# Patient Record
Sex: Female | Born: 1955 | Race: White | Hispanic: No | Marital: Single | Smoking: Current every day smoker
Health system: Southern US, Community
[De-identification: ages and names within clinical notes are randomized; demographics above are authoritative.]

## PROBLEM LIST (undated history)

## (undated) DIAGNOSIS — F4310 Post-traumatic stress disorder, unspecified: Secondary | ICD-10-CM

## (undated) DIAGNOSIS — J449 Chronic obstructive pulmonary disease, unspecified: Secondary | ICD-10-CM

## (undated) DIAGNOSIS — F32A Depression, unspecified: Secondary | ICD-10-CM

## (undated) DIAGNOSIS — F329 Major depressive disorder, single episode, unspecified: Secondary | ICD-10-CM

## (undated) DIAGNOSIS — I10 Essential (primary) hypertension: Secondary | ICD-10-CM

## (undated) HISTORY — PX: TONSILLECTOMY: SUR1361

---

## 1998-07-10 ENCOUNTER — Other Ambulatory Visit: Admission: RE | Admit: 1998-07-10 | Discharge: 1998-07-10 | Payer: Self-pay | Admitting: *Deleted

## 1999-12-30 ENCOUNTER — Encounter (INDEPENDENT_AMBULATORY_CARE_PROVIDER_SITE_OTHER): Payer: Self-pay | Admitting: *Deleted

## 1999-12-30 ENCOUNTER — Ambulatory Visit (HOSPITAL_COMMUNITY): Admission: RE | Admit: 1999-12-30 | Discharge: 1999-12-30 | Payer: Self-pay | Admitting: *Deleted

## 2001-10-22 ENCOUNTER — Encounter: Payer: Self-pay | Admitting: Obstetrics and Gynecology

## 2001-10-22 ENCOUNTER — Encounter: Admission: RE | Admit: 2001-10-22 | Discharge: 2001-10-22 | Payer: Self-pay | Admitting: Obstetrics and Gynecology

## 2005-04-16 ENCOUNTER — Ambulatory Visit (HOSPITAL_COMMUNITY): Admission: RE | Admit: 2005-04-16 | Discharge: 2005-04-16 | Payer: Self-pay | Admitting: Obstetrics and Gynecology

## 2006-04-21 ENCOUNTER — Ambulatory Visit (HOSPITAL_COMMUNITY): Admission: RE | Admit: 2006-04-21 | Discharge: 2006-04-21 | Payer: Self-pay | Admitting: Internal Medicine

## 2007-12-09 ENCOUNTER — Ambulatory Visit: Payer: Self-pay | Admitting: Family Medicine

## 2007-12-15 ENCOUNTER — Ambulatory Visit: Payer: Self-pay | Admitting: *Deleted

## 2007-12-15 ENCOUNTER — Ambulatory Visit (HOSPITAL_COMMUNITY): Admission: RE | Admit: 2007-12-15 | Discharge: 2007-12-15 | Payer: Self-pay | Admitting: Family Medicine

## 2007-12-27 ENCOUNTER — Ambulatory Visit: Payer: Self-pay | Admitting: Internal Medicine

## 2007-12-27 ENCOUNTER — Ambulatory Visit (HOSPITAL_COMMUNITY): Admission: RE | Admit: 2007-12-27 | Discharge: 2007-12-27 | Payer: Self-pay | Admitting: Family Medicine

## 2007-12-27 ENCOUNTER — Encounter: Payer: Self-pay | Admitting: Family Medicine

## 2007-12-27 LAB — CONVERTED CEMR LAB
CO2: 27 meq/L (ref 19–32)
Calcium: 9.1 mg/dL (ref 8.4–10.5)
Chloride: 92 meq/L — ABNORMAL LOW (ref 96–112)
Cholesterol: 205 mg/dL — ABNORMAL HIGH (ref 0–200)
Creatinine, Ser: 0.79 mg/dL (ref 0.40–1.20)
Eosinophils Relative: 3 % (ref 0–5)
Glucose, Bld: 104 mg/dL — ABNORMAL HIGH (ref 70–99)
HCT: 45.9 % (ref 36.0–46.0)
Hemoglobin: 16 g/dL — ABNORMAL HIGH (ref 12.0–15.0)
Lymphocytes Relative: 19 % (ref 12–46)
Lymphs Abs: 1.3 10*3/uL (ref 0.7–4.0)
Monocytes Absolute: 0.6 10*3/uL (ref 0.1–1.0)
Monocytes Relative: 8 % (ref 3–12)
Neutro Abs: 4.5 10*3/uL (ref 1.7–7.7)
RBC: 4.67 M/uL (ref 3.87–5.11)
Total Bilirubin: 0.4 mg/dL (ref 0.3–1.2)
Total CHOL/HDL Ratio: 2.3
Triglycerides: 94 mg/dL (ref <150)
VLDL: 19 mg/dL (ref 0–40)
WBC: 6.6 10*3/uL (ref 4.0–10.5)

## 2007-12-29 ENCOUNTER — Ambulatory Visit: Payer: Self-pay | Admitting: Family Medicine

## 2008-01-01 ENCOUNTER — Emergency Department (HOSPITAL_COMMUNITY): Admission: EM | Admit: 2008-01-01 | Discharge: 2008-01-01 | Payer: Self-pay | Admitting: Family Medicine

## 2008-02-09 ENCOUNTER — Ambulatory Visit: Payer: Self-pay | Admitting: Family Medicine

## 2008-02-16 ENCOUNTER — Ambulatory Visit: Payer: Self-pay | Admitting: Family Medicine

## 2008-02-19 ENCOUNTER — Emergency Department (HOSPITAL_COMMUNITY): Admission: EM | Admit: 2008-02-19 | Discharge: 2008-02-19 | Payer: Self-pay | Admitting: Emergency Medicine

## 2008-03-15 ENCOUNTER — Encounter: Payer: Self-pay | Admitting: Family Medicine

## 2008-03-15 ENCOUNTER — Ambulatory Visit: Payer: Self-pay | Admitting: Family Medicine

## 2008-03-15 LAB — CONVERTED CEMR LAB
CO2: 18 meq/L — ABNORMAL LOW (ref 19–32)
Calcium: 9.2 mg/dL (ref 8.4–10.5)
Chloride: 104 meq/L (ref 96–112)
Glucose, Bld: 110 mg/dL — ABNORMAL HIGH (ref 70–99)
Sodium: 138 meq/L (ref 135–145)

## 2008-03-22 ENCOUNTER — Ambulatory Visit: Payer: Self-pay | Admitting: Family Medicine

## 2008-03-29 ENCOUNTER — Ambulatory Visit: Payer: Self-pay | Admitting: Internal Medicine

## 2008-04-06 ENCOUNTER — Ambulatory Visit: Payer: Self-pay | Admitting: Internal Medicine

## 2008-04-25 ENCOUNTER — Ambulatory Visit: Payer: Self-pay | Admitting: Internal Medicine

## 2008-05-12 ENCOUNTER — Emergency Department (HOSPITAL_COMMUNITY): Admission: EM | Admit: 2008-05-12 | Discharge: 2008-05-12 | Payer: Self-pay | Admitting: Emergency Medicine

## 2008-06-14 ENCOUNTER — Encounter: Payer: Self-pay | Admitting: Family Medicine

## 2008-06-14 ENCOUNTER — Ambulatory Visit: Payer: Self-pay | Admitting: Family Medicine

## 2008-09-13 ENCOUNTER — Ambulatory Visit: Payer: Self-pay | Admitting: Internal Medicine

## 2008-11-15 ENCOUNTER — Ambulatory Visit: Payer: Self-pay | Admitting: Internal Medicine

## 2008-12-13 ENCOUNTER — Ambulatory Visit: Payer: Self-pay | Admitting: Internal Medicine

## 2008-12-18 ENCOUNTER — Ambulatory Visit: Payer: Self-pay | Admitting: Internal Medicine

## 2008-12-22 ENCOUNTER — Ambulatory Visit: Payer: Self-pay | Admitting: Internal Medicine

## 2009-03-15 ENCOUNTER — Ambulatory Visit: Payer: Self-pay | Admitting: Internal Medicine

## 2009-05-08 ENCOUNTER — Ambulatory Visit: Payer: Self-pay | Admitting: Internal Medicine

## 2009-05-08 ENCOUNTER — Encounter (INDEPENDENT_AMBULATORY_CARE_PROVIDER_SITE_OTHER): Payer: Self-pay | Admitting: Family Medicine

## 2009-05-08 LAB — CONVERTED CEMR LAB
ALT: 118 units/L — ABNORMAL HIGH (ref 0–35)
BUN: 8 mg/dL (ref 6–23)
Basophils Relative: 1 % (ref 0–1)
CO2: 23 meq/L (ref 19–32)
Calcium: 9.5 mg/dL (ref 8.4–10.5)
Chloride: 104 meq/L (ref 96–112)
Cholesterol: 158 mg/dL (ref 0–200)
Creatinine, Ser: 0.71 mg/dL (ref 0.40–1.20)
Eosinophils Absolute: 0.3 10*3/uL (ref 0.0–0.7)
Eosinophils Relative: 3 % (ref 0–5)
Glucose, Bld: 102 mg/dL — ABNORMAL HIGH (ref 70–99)
HCT: 48.3 % — ABNORMAL HIGH (ref 36.0–46.0)
HDL: 62 mg/dL (ref >39)
Lymphs Abs: 1.9 10*3/uL (ref 0.7–4.0)
MCHC: 33.5 g/dL (ref 30.0–36.0)
MCV: 104.1 fL — ABNORMAL HIGH (ref 78.0–100.0)
Monocytes Absolute: 0.6 10*3/uL (ref 0.1–1.0)
Monocytes Relative: 5 % (ref 3–12)
Neutrophils Relative %: 76 % (ref 43–77)
RBC: 4.64 M/uL (ref 3.87–5.11)
Total Bilirubin: 0.4 mg/dL (ref 0.3–1.2)
Total CHOL/HDL Ratio: 2.5
Triglycerides: 206 mg/dL — ABNORMAL HIGH (ref <150)
VLDL: 41 mg/dL — ABNORMAL HIGH (ref 0–40)
WBC: 12 10*3/uL — ABNORMAL HIGH (ref 4.0–10.5)

## 2009-08-06 ENCOUNTER — Emergency Department (HOSPITAL_COMMUNITY): Admission: EM | Admit: 2009-08-06 | Discharge: 2009-08-06 | Payer: Self-pay | Admitting: Emergency Medicine

## 2009-10-19 ENCOUNTER — Ambulatory Visit: Payer: Self-pay | Admitting: Family Medicine

## 2009-10-20 ENCOUNTER — Encounter (INDEPENDENT_AMBULATORY_CARE_PROVIDER_SITE_OTHER): Payer: Self-pay | Admitting: Family Medicine

## 2010-01-12 ENCOUNTER — Emergency Department (HOSPITAL_COMMUNITY)
Admission: EM | Admit: 2010-01-12 | Discharge: 2010-01-12 | Payer: Self-pay | Source: Home / Self Care | Admitting: Family Medicine

## 2010-01-12 ENCOUNTER — Ambulatory Visit (HOSPITAL_COMMUNITY)
Admission: RE | Admit: 2010-01-12 | Discharge: 2010-01-12 | Payer: Self-pay | Source: Home / Self Care | Admitting: Family Medicine

## 2010-08-02 ENCOUNTER — Emergency Department (HOSPITAL_COMMUNITY)
Admission: EM | Admit: 2010-08-02 | Discharge: 2010-08-02 | Disposition: A | Discharge status: Home / Self Care | Payer: Self-pay | Attending: Emergency Medicine | Admitting: Emergency Medicine

## 2010-08-02 DIAGNOSIS — J449 Chronic obstructive pulmonary disease, unspecified: Secondary | ICD-10-CM | POA: Insufficient documentation

## 2010-08-02 DIAGNOSIS — F341 Dysthymic disorder: Secondary | ICD-10-CM | POA: Insufficient documentation

## 2010-08-02 DIAGNOSIS — I10 Essential (primary) hypertension: Secondary | ICD-10-CM | POA: Insufficient documentation

## 2010-08-02 DIAGNOSIS — Z79899 Other long term (current) drug therapy: Secondary | ICD-10-CM | POA: Insufficient documentation

## 2010-08-02 DIAGNOSIS — J4489 Other specified chronic obstructive pulmonary disease: Secondary | ICD-10-CM | POA: Insufficient documentation

## 2010-08-02 LAB — COMPREHENSIVE METABOLIC PANEL
Alkaline Phosphatase: 83 U/L (ref 39–117)
BUN: 9 mg/dL (ref 6–23)
Chloride: 97 mEq/L (ref 96–112)
Creatinine, Ser: 0.75 mg/dL (ref 0.4–1.2)
GFR calc non Af Amer: >60 mL/min (ref >60)
Glucose, Bld: 109 mg/dL — ABNORMAL HIGH (ref 70–99)
Potassium: 4.4 mEq/L (ref 3.5–5.1)
Total Bilirubin: 0.3 mg/dL (ref 0.3–1.2)

## 2010-08-02 LAB — ETHANOL: Alcohol, Ethyl (B): <11 mg/dL — ABNORMAL HIGH (ref 0–10)

## 2010-08-02 LAB — CBC
HCT: 49.3 % — ABNORMAL HIGH (ref 36.0–46.0)
Hemoglobin: 17.2 g/dL — ABNORMAL HIGH (ref 12.0–15.0)
MCHC: 34.9 g/dL (ref 30.0–36.0)
RDW: 13.9 % (ref 11.5–15.5)
WBC: 8.1 10*3/uL (ref 4.0–10.5)

## 2010-08-02 LAB — RAPID URINE DRUG SCREEN, HOSP PERFORMED
Barbiturates: NOT DETECTED
Cocaine: NOT DETECTED
Opiates: NOT DETECTED

## 2010-08-02 LAB — DIFFERENTIAL
Eosinophils Absolute: 0.3 10*3/uL (ref 0.0–0.7)
Lymphs Abs: 1.8 10*3/uL (ref 0.7–4.0)
Monocytes Absolute: 0.7 10*3/uL (ref 0.1–1.0)
Monocytes Relative: 8 % (ref 3–12)
Neutrophils Relative %: 65 % (ref 43–77)

## 2010-08-15 ENCOUNTER — Other Ambulatory Visit: Payer: Self-pay | Admitting: Family Medicine

## 2010-12-03 LAB — POCT RAPID STREP A: Streptococcus, Group A Screen (Direct): NEGATIVE

## 2010-12-06 LAB — POCT RAPID STREP A: Streptococcus, Group A Screen (Direct): NEGATIVE

## 2011-01-19 ENCOUNTER — Emergency Department (INDEPENDENT_AMBULATORY_CARE_PROVIDER_SITE_OTHER): Payer: Self-pay

## 2011-01-19 ENCOUNTER — Encounter (HOSPITAL_COMMUNITY): Payer: Self-pay | Admitting: Emergency Medicine

## 2011-01-19 ENCOUNTER — Emergency Department (HOSPITAL_COMMUNITY)
Admission: EM | Admit: 2011-01-19 | Discharge: 2011-01-19 | Disposition: A | Discharge status: Home / Self Care | Payer: Self-pay | Source: Home / Self Care | Attending: Emergency Medicine | Admitting: Emergency Medicine

## 2011-01-19 DIAGNOSIS — J441 Chronic obstructive pulmonary disease with (acute) exacerbation: Secondary | ICD-10-CM

## 2011-01-19 HISTORY — DX: Chronic obstructive pulmonary disease, unspecified: J44.9

## 2011-01-19 MED ORDER — PREDNISONE 20 MG PO TABS
ORAL_TABLET | ORAL | Status: AC
  Filled 2011-01-19: qty 3

## 2011-01-19 MED ORDER — ALBUTEROL SULFATE (5 MG/ML) 0.5% IN NEBU
INHALATION_SOLUTION | RESPIRATORY_TRACT | Status: AC
  Filled 2011-01-19: qty 1

## 2011-01-19 MED ORDER — PREDNISONE 20 MG PO TABS
ORAL_TABLET | ORAL | Status: AC
  Filled 2011-01-19: qty 1

## 2011-01-19 MED ORDER — DEXAMETHASONE 4 MG PO TABS: ORAL_TABLET | ORAL | Status: AC

## 2011-01-19 MED ORDER — ALBUTEROL SULFATE (5 MG/ML) 0.5% IN NEBU
5.0000 mg | INHALATION_SOLUTION | Freq: Once | RESPIRATORY_TRACT | Status: AC
  Administered 2011-01-19: 5 mg via RESPIRATORY_TRACT

## 2011-01-19 MED ORDER — PREDNISONE 20 MG PO TABS
60.0000 mg | ORAL_TABLET | Freq: Once | ORAL | Status: AC
  Administered 2011-01-19: 60 mg via ORAL

## 2011-01-19 MED ORDER — IPRATROPIUM BROMIDE 0.02 % IN SOLN
0.5000 mg | Freq: Once | RESPIRATORY_TRACT | Status: AC
  Administered 2011-01-19: 0.5 mg via RESPIRATORY_TRACT

## 2011-01-19 NOTE — ED Provider Notes (Signed)
History     CSN: [id number] Arrival date & time: 01/19/2011  1:10 PM   First MD Initiated Contact with Patient 01/19/11 1304      No chief complaint on file.   HPI Comments: Pt with h/o asthma/COPD with 6 days/weeks of coughing, wheezing, SOB, chest tightness, DOE.  Achy CP after coughing. requiring rescue inhaler/nebs multiple times/day w/o relief. Took 20 mg prednisone PTA. Last admission for COPD exacerbation last year. No intubations.  Asthma triggered by URI with rhinorrhea, nasal congestion, throat irritation. Tried multiple OTC cold meds w/o relief. States this feels identical to previous COPD episodes.   Patient is a 55 y.o. female presenting with shortness of breath.  Shortness of Breath  The current episode started 5 to 7 days ago. The problem has been gradually worsening. The problem is moderate. The symptoms are relieved by nothing. Associated symptoms include rhinorrhea, sore throat, cough, shortness of breath and wheezing. Pertinent negatives include no chest pain, no chest pressure, no orthopnea, no fever and no stridor. Associated symptoms comments: States felt "hot" with chills but no documented fevers.. She has not inhaled smoke recently. She has had prior hospitalizations. She has had no prior ICU admissions. She has had no prior intubations. Her past medical history is significant for asthma.    Past Medical History  Diagnosis Date  . COPD (chronic obstructive pulmonary disease)   . Asthma     History reviewed. No pertinent past surgical history.  History reviewed. No pertinent family history.  History  Substance Use Topics  . Smoking status: Current Everyday Smoker -- 1.0 packs/day for 33 years  . Smokeless tobacco: Not on file  . Alcohol Use: Yes    OB History    Grav Para Term Preterm Abortions TAB SAB Ect Mult Living                  Review of Systems  Constitutional: Negative for fever.  HENT: Positive for sore throat and rhinorrhea. Negative for  voice change.   Respiratory: Positive for cough, chest tightness, shortness of breath and wheezing. Negative for stridor.   Cardiovascular: Negative for chest pain and orthopnea.  Gastrointestinal: Negative for vomiting.  Musculoskeletal: Negative for myalgias.  Neurological: Positive for weakness.    Allergies  Review of patient's allergies indicates not on file.  Home Medications  No current outpatient prescriptions on file.  BP 116/82  Pulse 91  Temp(Src) 98.4 F (36.9 C) (Oral)  Resp 19  SpO2 97%  Physical Exam  Nursing note and vitals reviewed. Constitutional: She is oriented to person, place, and time. She appears well-developed and well-nourished.  HENT:  Head: Normocephalic and atraumatic.  Nose: Mucosal edema and rhinorrhea present.  Eyes: EOM are normal. Pupils are equal, round, and reactive to light.  Neck: Normal range of motion.  Cardiovascular: Regular rhythm.   Pulmonary/Chest: She is in respiratory distress. She has wheezes. She has no rales.       Prolonged exp phase. Poor air movement. Faint wheezing throughout. Occ crackles at bases.   Abdominal: She exhibits no distension.  Musculoskeletal: Normal range of motion.  Neurological: She is alert and oriented to person, place, and time.  Skin: Skin is warm and dry.  Psychiatric: She has a normal mood and affect. Her behavior is normal. Judgment and thought content normal.    ED Course  Procedures (including critical care time)  Labs Reviewed - No data to display Dg Chest 2 View  01/19/2011  *RADIOLOGY REPORT*  Clinical Data: Shortness of breath, cough  CHEST - 2 VIEW  Comparison: 01/12/2010  Findings: Heart size upper limits normal.  Lungs clear.  No effusion.  Regional bones unremarkable.  IMPRESSION:  1.  No acute disease  Original Report Authenticated By: Osa Craver, M.D.     No diagnosis found. Dg Chest 2 View  01/19/2011  *RADIOLOGY REPORT*  Clinical Data: Shortness of breath, cough   CHEST - 2 VIEW  Comparison: 01/12/2010  Findings: Heart size upper limits normal.  Lungs clear.  No effusion.  Regional bones unremarkable.  IMPRESSION:  1.  No acute disease  Original Report Authenticated By: Osa Craver, M.D.    MDM  1350- Pt seen and examined. Pt speaking in short sentences only, is anxious, poor air movement, faint wheezing throughout. satting 97%. Pt given albuterol/atrovent, predinsone for asthma/COPD exacerbation,. Peak flow done pretx 170/20/150. Will do CXR because of recent URI. Will re-evaluate  1430 The patient was given the following meds in the [facility]:   Medications  predniSONE (DELTASONE) tablet 60 mg (60 mg Oral Given 01/19/11 1405)  ipratropium (ATROVENT) nebulizer solution 0.5 mg (0.5 mg Nebulization Given 01/19/11 1405)    And  albuterol (PROVENTIL) (5 MG/ML) 0.5% nebulizer solution 5 mg (5 mg Nebulization Given 01/19/11 1406)     And had the following response:   On re-evaluation, pt feels much improved after medication, pt comfortable, VSS. Improved air movement on repeat physical [peak flow 250/230/290. States feels ok to go home.   Discussed imaging, lab results with patient. Pt states has enough albuterol, advair at home. Emphasized importance of f/u. Pt agrees.      Luiz Blare, MD 01/19/11 [phone]

## 2011-01-20 ENCOUNTER — Encounter (HOSPITAL_COMMUNITY): Payer: Self-pay | Admitting: *Deleted

## 2011-01-20 ENCOUNTER — Emergency Department (HOSPITAL_COMMUNITY)
Admission: EM | Admit: 2011-01-20 | Discharge: 2011-01-20 | Disposition: A | Discharge status: Home / Self Care | Payer: Self-pay | Attending: Emergency Medicine | Admitting: Emergency Medicine

## 2011-01-20 DIAGNOSIS — F17200 Nicotine dependence, unspecified, uncomplicated: Secondary | ICD-10-CM | POA: Insufficient documentation

## 2011-01-20 DIAGNOSIS — Z79899 Other long term (current) drug therapy: Secondary | ICD-10-CM | POA: Insufficient documentation

## 2011-01-20 DIAGNOSIS — I10 Essential (primary) hypertension: Secondary | ICD-10-CM | POA: Insufficient documentation

## 2011-01-20 DIAGNOSIS — J449 Chronic obstructive pulmonary disease, unspecified: Secondary | ICD-10-CM

## 2011-01-20 DIAGNOSIS — J4489 Other specified chronic obstructive pulmonary disease: Secondary | ICD-10-CM | POA: Insufficient documentation

## 2011-01-20 HISTORY — DX: Essential (primary) hypertension: I10

## 2011-01-20 MED ORDER — BENZONATATE 100 MG PO CAPS: 100.0000 mg | ORAL_CAPSULE | Freq: Three times a day (TID) | ORAL | Status: AC

## 2011-01-20 MED ORDER — ALBUTEROL SULFATE (5 MG/ML) 0.5% IN NEBU
5.0000 mg | INHALATION_SOLUTION | Freq: Once | RESPIRATORY_TRACT | Status: AC
  Administered 2011-01-20: 5 mg via RESPIRATORY_TRACT
  Filled 2011-01-20 (×2): qty 0.5

## 2011-01-20 MED ORDER — BENZONATATE 100 MG PO CAPS
200.0000 mg | ORAL_CAPSULE | Freq: Once | ORAL | Status: AC
  Administered 2011-01-20: 200 mg via ORAL
  Filled 2011-01-20 (×2): qty 1

## 2011-01-20 MED ORDER — IPRATROPIUM BROMIDE 0.02 % IN SOLN
0.5000 mg | Freq: Once | RESPIRATORY_TRACT | Status: AC
  Administered 2011-01-20: 0.5 mg via RESPIRATORY_TRACT
  Filled 2011-01-20: qty 2.5

## 2011-01-20 MED ORDER — OXYCODONE-ACETAMINOPHEN 5-325 MG PO TABS
1.0000 | ORAL_TABLET | Freq: Once | ORAL | Status: AC
  Administered 2011-01-20: 1 via ORAL
  Filled 2011-01-20: qty 1

## 2011-01-20 NOTE — ED Notes (Signed)
Pt has stopped coughing, feels better

## 2011-01-20 NOTE — ED Notes (Signed)
Pt states she has had a cough for the last week that is productive.(white/yellow) Pt states she went to urgent care today and was given prednisone and a inhalers and nebulizer. Has been taking delsome. Pt states she cant quit coughing and feels panicky and sob.

## 2011-01-20 NOTE — ED Provider Notes (Signed)
History     CSN: [id number] Arrival date & time: 01/20/2011  3:34 AM   First MD Initiated Contact with Patient 01/20/11 [phone]      Chief Complaint  Patient presents with  . Shortness of Breath    (Consider location/radiation/quality/duration/timing/severity/associated sxs/prior treatment) HPI The patient has had a cough for the last week. She's been bringing up light yellow sputum. Patient does have history of COPD and she does continue to smoke. Patient was seen at a [facility] urgent care today and was giving breathing treatments with some improvement. Patient also had a chest x-ray that did not show any signs of pneumonia. Patient has been continuing medications but this evening she started to feel short of breath.  Patient has been coughing a lot and are throat is sore. She also feels like her voice is hoarse. Patient states the symptoms are better after breathing treatments. It does worsen with exertion. Symptoms are moderate to severe Past Medical History  Diagnosis Date  . COPD (chronic obstructive pulmonary disease)   . Asthma   . Hypertension     History reviewed. No pertinent past surgical history.  Family History  Problem Relation Age of Onset  . Rheum arthritis Mother   . Cancer Father   . Heart failure Father     History  Substance Use Topics  . Smoking status: Current Everyday Smoker -- 1.0 packs/day for 33 years  . Smokeless tobacco: Not on file  . Alcohol Use: Yes    OB History    Grav Para Term Preterm Abortions TAB SAB Ect Mult Living                  Review of Systems  All other systems reviewed and are negative.    Allergies  Penicillins  Home Medications   Current Outpatient Rx  Name Route Sig Dispense Refill  . ALBUTEROL SULFATE (2.5 MG/3ML) 0.083% IN NEBU Nebulization Take 2.5 mg by nebulization every 6 (six) hours as needed.      Marland Kitchen VITAMIN D 1000 UNITS PO TABS Oral Take 1,000 Units by mouth daily.      Marland Kitchen DEXTROMETHORPHAN-GUAIFENESIN  30-600 MG PO TB12 Oral Take 1-2 tablets by mouth every 12 (twelve) hours. With water For congestion and cough     . OMEGA-3 FATTY ACIDS 1000 MG PO CAPS Oral Take 1 g by mouth daily.      Marland Kitchen FLUTICASONE-SALMETEROL 230-21 MCG/ACT IN AERO Inhalation Inhale 2 puffs into the lungs 2 (two) times daily.      Marland Kitchen MONTELUKAST SODIUM 10 MG PO TABS Oral Take 10 mg by mouth at bedtime.      Marland Kitchen PAROXETINE HCL 10 MG PO TABS Oral Take 10 mg by mouth every morning.      Marland Kitchen POTASSIUM CHLORIDE CR 8 MEQ PO TBCR Oral Take 8 mEq by mouth 2 (two) times daily.      Marland Kitchen RANITIDINE HCL 150 MG PO TABS Oral Take 150 mg by mouth 2 (two) times daily.      Marland Kitchen TIOTROPIUM BROMIDE MONOHYDRATE 18 MCG IN CAPS Inhalation Place 18 mcg into inhaler and inhale daily.      Marland Kitchen VALSARTAN 40 MG PO TABS Oral Take 60 mg by mouth at bedtime.     Marland Kitchen DEXAMETHASONE 4 MG PO TABS  4 tabs po at once on first day, the 4 tabs po at once on second day 16 tablet 0    BP 130/78  Pulse 98  Temp(Src) 98.5 F (36.9  C) (Oral)  Resp 16  SpO2 99%  Physical Exam  Nursing note and vitals reviewed. Constitutional: She appears well-developed and well-nourished. No distress.  HENT:  Head: Normocephalic and atraumatic.  Right Ear: External ear normal.  Left Ear: External ear normal.  Eyes: Conjunctivae are normal. Right eye exhibits no discharge. Left eye exhibits no discharge. No scleral icterus.  Neck: Neck supple. No tracheal deviation present.  Cardiovascular: Normal rate, regular rhythm and intact distal pulses.   Pulmonary/Chest: Effort normal. No accessory muscle usage or stridor. Not tachypneic. No respiratory distress. She has wheezes (Faint  wheezing left lower lobe). She has no rales.  Abdominal: Soft. Bowel sounds are normal. She exhibits no distension. There is no tenderness. There is no rebound and no guarding.  Musculoskeletal: She exhibits no edema and no tenderness.  Neurological: She is alert. She has normal strength. No sensory deficit.  Cranial nerve deficit:  no gross defecits noted. She exhibits normal muscle tone. She displays no seizure activity. Coordination normal.  Skin: Skin is warm and dry. No rash noted.  Psychiatric: She has a normal mood and affect.    ED Course  Procedures (including critical care time)  Medications  dextromethorphan-guaiFENesin (MUCINEX DM) 30-600 MG per 12 hr tablet (not administered)  potassium chloride (KLOR-CON) 8 MEQ tablet (not administered)  cholecalciferol (VITAMIN D) 1000 UNITS tablet (not administered)  fish oil-omega-3 fatty acids 1000 MG capsule (not administered)  ipratropium (ATROVENT) nebulizer solution 0.5 mg (0.5 mg Nebulization Given 01/20/11 0456)  albuterol (PROVENTIL) (5 MG/ML) 0.5% nebulizer solution 5 mg (5 mg Nebulization Given 01/20/11 0457)  oxyCODONE-acetaminophen (PERCOCET) 5-325 MG per tablet 1 tablet (1 tablet Oral Given 01/20/11 0442)  benzonatate (TESSALON) capsule 200 mg (200 mg Oral Given 01/20/11 0508)    Labs Reviewed - No data to display Dg Chest 2 View  01/19/2011  *RADIOLOGY REPORT*  Clinical Data: Shortness of breath, cough  CHEST - 2 VIEW  Comparison: 01/12/2010  Findings: Heart size upper limits normal.  Lungs clear.  No effusion.  Regional bones unremarkable.  IMPRESSION:  1.  No acute disease  Original Report Authenticated By: Thora Lance III, M.D.     1. COPD (chronic obstructive pulmonary disease)       MDM  Pt feeling better after the tessalon.  Coughing has abated.  No sign of significant wheezing.  Will have her continue her prior medications including oral steroids       Celene Kras, MD 01/20/11 [phone]

## 2011-08-07 ENCOUNTER — Other Ambulatory Visit (HOSPITAL_COMMUNITY): Payer: Self-pay | Admitting: Family Medicine

## 2011-08-07 ENCOUNTER — Ambulatory Visit (HOSPITAL_COMMUNITY)
Admission: RE | Admit: 2011-08-07 | Discharge: 2011-08-07 | Disposition: A | Discharge status: Home / Self Care | Payer: Self-pay | Source: Ambulatory Visit | Attending: Family Medicine | Admitting: Family Medicine

## 2011-08-07 DIAGNOSIS — R52 Pain, unspecified: Secondary | ICD-10-CM

## 2011-08-07 DIAGNOSIS — M25559 Pain in unspecified hip: Secondary | ICD-10-CM | POA: Insufficient documentation

## 2011-10-14 ENCOUNTER — Ambulatory Visit: Payer: Self-pay | Attending: Family Medicine | Admitting: Physical Therapy

## 2011-10-14 DIAGNOSIS — M545 Low back pain, unspecified: Secondary | ICD-10-CM | POA: Insufficient documentation

## 2011-10-14 DIAGNOSIS — IMO0001 Reserved for inherently not codable concepts without codable children: Secondary | ICD-10-CM | POA: Insufficient documentation

## 2011-10-14 DIAGNOSIS — M6281 Muscle weakness (generalized): Secondary | ICD-10-CM | POA: Insufficient documentation

## 2011-10-14 DIAGNOSIS — M25559 Pain in unspecified hip: Secondary | ICD-10-CM | POA: Insufficient documentation

## 2011-10-14 DIAGNOSIS — R262 Difficulty in walking, not elsewhere classified: Secondary | ICD-10-CM | POA: Insufficient documentation

## 2011-10-14 DIAGNOSIS — M2560 Stiffness of unspecified joint, not elsewhere classified: Secondary | ICD-10-CM | POA: Insufficient documentation

## 2011-10-20 ENCOUNTER — Ambulatory Visit: Payer: Self-pay | Admitting: Physical Therapy

## 2011-10-27 ENCOUNTER — Ambulatory Visit: Payer: Self-pay | Admitting: Physical Therapy

## 2011-10-29 ENCOUNTER — Ambulatory Visit: Payer: Self-pay | Admitting: Physical Therapy

## 2011-11-04 ENCOUNTER — Ambulatory Visit: Payer: Self-pay | Attending: Family Medicine | Admitting: Physical Therapy

## 2011-11-04 DIAGNOSIS — M545 Low back pain, unspecified: Secondary | ICD-10-CM | POA: Insufficient documentation

## 2011-11-04 DIAGNOSIS — R262 Difficulty in walking, not elsewhere classified: Secondary | ICD-10-CM | POA: Insufficient documentation

## 2011-11-04 DIAGNOSIS — IMO0001 Reserved for inherently not codable concepts without codable children: Secondary | ICD-10-CM | POA: Insufficient documentation

## 2011-11-04 DIAGNOSIS — M25559 Pain in unspecified hip: Secondary | ICD-10-CM | POA: Insufficient documentation

## 2011-11-04 DIAGNOSIS — M2560 Stiffness of unspecified joint, not elsewhere classified: Secondary | ICD-10-CM | POA: Insufficient documentation

## 2011-11-04 DIAGNOSIS — M6281 Muscle weakness (generalized): Secondary | ICD-10-CM | POA: Insufficient documentation

## 2011-11-06 ENCOUNTER — Ambulatory Visit: Payer: Self-pay | Admitting: Physical Therapy

## 2011-11-13 ENCOUNTER — Ambulatory Visit: Payer: Self-pay | Admitting: Physical Therapy

## 2011-11-17 ENCOUNTER — Encounter: Payer: Self-pay | Admitting: Physical Therapy

## 2011-11-19 ENCOUNTER — Ambulatory Visit: Payer: Self-pay | Admitting: Physical Therapy

## 2012-05-31 ENCOUNTER — Emergency Department (HOSPITAL_COMMUNITY)
Admission: EM | Admit: 2012-05-31 | Discharge: 2012-05-31 | Disposition: A | Discharge status: Home / Self Care | Payer: No Typology Code available for payment source | Source: Home / Self Care

## 2012-06-01 ENCOUNTER — Emergency Department (HOSPITAL_COMMUNITY)
Admission: EM | Admit: 2012-06-01 | Discharge: 2012-06-01 | Disposition: A | Discharge status: Home Health | Payer: No Typology Code available for payment source | Source: Home / Self Care

## 2012-06-01 DIAGNOSIS — J449 Chronic obstructive pulmonary disease, unspecified: Secondary | ICD-10-CM

## 2012-06-01 MED ORDER — TIOTROPIUM BROMIDE MONOHYDRATE 18 MCG IN CAPS: 18.0000 ug | ORAL_CAPSULE | Freq: Every day | RESPIRATORY_TRACT | Status: DC

## 2012-06-01 MED ORDER — AZITHROMYCIN 250 MG PO TABS: ORAL_TABLET | ORAL | Status: DC

## 2012-06-01 MED ORDER — ALBUTEROL SULFATE HFA 108 (90 BASE) MCG/ACT IN AERS: 2.0000 | INHALATION_SPRAY | Freq: Four times a day (QID) | RESPIRATORY_TRACT | Status: DC | PRN

## 2012-06-01 MED ORDER — PREDNISONE 10 MG PO TABS: ORAL_TABLET | ORAL | Status: DC

## 2012-06-01 NOTE — ED Provider Notes (Signed)
History     CSN: [id number]  Arrival date & time 06/01/12  1602   First MD Initiated Contact with Patient 06/01/12 1630     (Consider location/radiation/quality/duration/timing/severity/associated sxs/prior Treatment)  HPI  The patient is a 57 year old female with history of COPD who presents to clinic with main concern of progressively worsening shortness of breath associated with subjective fevers and chills, initially started one week prior to this visit and with no significant relief of symptoms using albuterol. Patient denies chest pain, no specific abdominal or urinary concerns. She explains that she has had similar events in the past that were related to COPD flare. She denies known sick contacts or exposures and no recent hospitalizations.   Past Medical History  Diagnosis Date  . COPD (chronic obstructive pulmonary disease)   . Asthma   . Hypertension     No past surgical history on file.  Family History  Problem Relation Age of Onset  . Rheum arthritis Mother   . Cancer Father   . Heart failure Father     History  Substance Use Topics  . Smoking status: Current Every Day Smoker -- 1.00 packs/day for 33 years  . Smokeless tobacco: Not on file  . Alcohol Use: Yes    OB History   Grav Para Term Preterm Abortions TAB SAB Ect Mult Living                  Review of Systems Constitutional: Negative for diaphoresis, activity change, appetite change and fatigue.  HENT: Negative for ear pain, nosebleeds, congestion, facial swelling, rhinorrhea, neck pain, neck stiffness and ear discharge.   Eyes: Negative for pain, discharge, redness, itching and visual disturbance.  Respiratory: Negative for choking, chest tightness, and stridor.   Cardiovascular: Negative for chest pain, palpitations and leg swelling.  Gastrointestinal: Negative for abdominal distention.  Genitourinary: Negative for dysuria, urgency, frequency, hematuria, flank pain, decreased urine volume,  difficulty urinating and dyspareunia.  Musculoskeletal: Negative for back pain, joint swelling, arthralgias and gait problem.  Neurological: Negative for dizziness, tremors, seizures, syncope, facial asymmetry, speech difficulty, weakness, light-headedness, numbness and headaches.  Hematological: Negative for adenopathy. Does not bruise/bleed easily.  Psychiatric/Behavioral: Negative for hallucinations, behavioral problems, confusion, dysphoric mood, decreased concentration and agitation.    Allergies  Penicillins  Home Medications   Current Outpatient Rx  Name  Route  Sig  Dispense  Refill  . albuterol (PROVENTIL HFA;VENTOLIN HFA) 108 (90 BASE) MCG/ACT inhaler   Inhalation   Inhale 2 puffs into the lungs every 6 (six) hours as needed for wheezing.   1 Inhaler   11   . albuterol (PROVENTIL) (2.5 MG/3ML) 0.083% nebulizer solution   Nebulization   Take 2.5 mg by nebulization every 6 (six) hours as needed.           Marland Kitchen azithromycin (ZITHROMAX Z-PAK) 250 MG tablet      Use as per pharmacy directions   6 each   0   . cholecalciferol (VITAMIN D) 1000 UNITS tablet   Oral   Take 1,000 Units by mouth daily.           Marland Kitchen dextromethorphan-guaiFENesin (MUCINEX DM) 30-600 MG per 12 hr tablet   Oral   Take 1-2 tablets by mouth every 12 (twelve) hours. With water For congestion and cough          . fish oil-omega-3 fatty acids 1000 MG capsule   Oral   Take 1 g by mouth daily.           Marland Kitchen  fluticasone-salmeterol (ADVAIR HFA) 230-21 MCG/ACT inhaler   Inhalation   Inhale 2 puffs into the lungs 2 (two) times daily.           . montelukast (SINGULAIR) 10 MG tablet   Oral   Take 10 mg by mouth at bedtime.           Marland Kitchen PARoxetine (PAXIL) 10 MG tablet   Oral   Take 10 mg by mouth every morning.           . potassium chloride (KLOR-CON) 8 MEQ tablet   Oral   Take 8 mEq by mouth 2 (two) times daily.           . predniSONE (DELTASONE) 10 MG tablet      Take 50 mg tablet  today, continue to taper down by 10 mg daily until completed   15 tablet   0   . ranitidine (ZANTAC) 150 MG tablet   Oral   Take 150 mg by mouth 2 (two) times daily.           Marland Kitchen tiotropium (SPIRIVA) 18 MCG inhalation capsule   Inhalation   Place 1 capsule (18 mcg total) into inhaler and inhale daily.   30 capsule   11   . valsartan (DIOVAN) 40 MG tablet   Oral   Take 60 mg by mouth at bedtime.            BP 124/74  Pulse 78  Temp(Src) 99 F (37.2 C)  SpO2 100%  Physical Exam Constitutional: Appears well-developed and well-nourished. No distress.  HENT: Normocephalic. External right and left ear normal. Oropharynx is clear and moist.  Eyes: Conjunctivae and EOM are normal. PERRLA, no scleral icterus.  Neck: Normal ROM. Neck supple. No JVD. No tracheal deviation. No thyromegaly.  CVS: RRR, S1/S2 +, no murmurs, no gallops, no carotid bruit.  Pulmonary: Effort and breath sounds normal, expiratory wheezing  Abdominal: Soft. BS +,  no distension, tenderness, rebound or guarding.  Musculoskeletal: Normal range of motion. No edema and no tenderness.  Lymphadenopathy: No lymphadenopathy noted, cervical, inguinal. Neuro: Alert. Normal reflexes, muscle tone coordination. No cranial nerve deficit. Skin: Skin is warm and dry. No rash noted. Not diaphoretic. No erythema. No pallor.  Psychiatric: Normal mood and affect. Behavior, judgment, thought content normal.    ED Course  Procedures (including critical care time)  Labs Reviewed - No data to display No results found.   1. COPD (chronic obstructive pulmonary disease)    - Physical exam findings consistent with COPD flare, we'll prescribe course of Zithromax, steroid taper pack, albuterol inhaler as needed - Patient advised if her symptoms do not improve or get worse she needs to go immediately to emergency department - Infestation discussed in detail   MDM  COPD       Dorothea Ogle, MD 06/01/12 1651

## 2012-12-16 ENCOUNTER — Other Ambulatory Visit: Payer: Self-pay | Admitting: Obstetrics and Gynecology

## 2012-12-16 DIAGNOSIS — Z1231 Encounter for screening mammogram for malignant neoplasm of breast: Secondary | ICD-10-CM

## 2013-01-04 ENCOUNTER — Ambulatory Visit (HOSPITAL_COMMUNITY)
Admission: RE | Admit: 2013-01-04 | Discharge: 2013-01-04 | Disposition: A | Discharge status: Home / Self Care | Payer: Self-pay | Source: Ambulatory Visit | Attending: Obstetrics and Gynecology | Admitting: Obstetrics and Gynecology

## 2013-01-04 ENCOUNTER — Encounter (HOSPITAL_COMMUNITY): Payer: Self-pay

## 2013-01-04 VITALS — BP 120/78 | Temp 98.2°F | Ht 63.5 in | Wt 176.4 lb

## 2013-01-04 DIAGNOSIS — Z1231 Encounter for screening mammogram for malignant neoplasm of breast: Secondary | ICD-10-CM

## 2013-01-04 DIAGNOSIS — Z1239 Encounter for other screening for malignant neoplasm of breast: Secondary | ICD-10-CM

## 2013-01-04 HISTORY — DX: Depression, unspecified: F32.A

## 2013-01-04 HISTORY — DX: Major depressive disorder, single episode, unspecified: F32.9

## 2013-01-04 HISTORY — DX: Post-traumatic stress disorder, unspecified: F43.10

## 2013-01-04 NOTE — Patient Instructions (Signed)
Taught Jenny Branch how to perform BSE and gave educational materials to take home. Patient did not need a Pap smear today due to last Pap smear was 08/15/2010. Let her know [facility] will cover Pap smears every 3 years unless has a history of abnormal Pap smears. Let her know that her next Pap smear is due June 2015 and that she can come back to [facility] to have completed. Smoking Cessation discussed with patient. Patient currently is attending the smoking cessation classes at the [facility]. Jenny Branch verbalized understanding. Patient escorted to mammography for a screening mammogram.  Jenny Branch, Kathaleen Maser, RN 2:23 PM

## 2013-01-04 NOTE — Addendum Note (Signed)
Encounter addended by: Saintclair Halsted, RN on: 01/04/2013  2:26 PM<BR>     Documentation filed: Visit Diagnoses

## 2013-01-04 NOTE — Progress Notes (Signed)
No complaints today.  Pap Smear:  Pap smear not completed today. Last Pap smear was 08/15/2010 at [facility] and normal. Per patient has no history of an abnormal Pap smear. Last Pap smear result is in EPIC.  Physical exam: Breasts Breasts symmetrical. No skin abnormalities bilateral breasts. No nipple retraction bilateral breasts. No nipple discharge bilateral breasts. No lymphadenopathy. No lumps palpated bilateral breasts. No complaints of pain or tenderness on exam. Patient escorted to mammography for a screening mammogram.        Pelvic/Bimanual No Pap smear completed today since last Pap smear was 08/15/2010. Pap smear not indicated per [facility] guidelines.

## 2013-01-17 ENCOUNTER — Encounter (HOSPITAL_COMMUNITY): Payer: Self-pay

## 2013-06-13 ENCOUNTER — Other Ambulatory Visit: Payer: Self-pay | Admitting: Family Medicine

## 2013-06-14 ENCOUNTER — Other Ambulatory Visit: Payer: Self-pay | Admitting: Family Medicine

## 2013-07-11 ENCOUNTER — Encounter: Payer: Self-pay | Admitting: Pulmonary Disease

## 2013-07-14 ENCOUNTER — Institutional Professional Consult (permissible substitution): Payer: Self-pay | Admitting: Pulmonary Disease

## 2013-07-27 ENCOUNTER — Institutional Professional Consult (permissible substitution): Payer: Self-pay | Admitting: Pulmonary Disease

## 2013-07-28 ENCOUNTER — Encounter: Payer: Self-pay | Admitting: Pulmonary Disease

## 2013-07-28 ENCOUNTER — Other Ambulatory Visit (INDEPENDENT_AMBULATORY_CARE_PROVIDER_SITE_OTHER): Payer: Self-pay

## 2013-07-28 ENCOUNTER — Ambulatory Visit (INDEPENDENT_AMBULATORY_CARE_PROVIDER_SITE_OTHER)
Admission: RE | Admit: 2013-07-28 | Discharge: 2013-07-28 | Disposition: A | Discharge status: Home / Self Care | Payer: Self-pay | Source: Ambulatory Visit | Attending: Pulmonary Disease | Admitting: Pulmonary Disease

## 2013-07-28 ENCOUNTER — Ambulatory Visit (INDEPENDENT_AMBULATORY_CARE_PROVIDER_SITE_OTHER): Payer: Self-pay | Admitting: Pulmonary Disease

## 2013-07-28 VITALS — BP 124/66 | HR 99 | Ht 63.0 in | Wt 167.0 lb

## 2013-07-28 DIAGNOSIS — J449 Chronic obstructive pulmonary disease, unspecified: Secondary | ICD-10-CM

## 2013-07-28 DIAGNOSIS — F17210 Nicotine dependence, cigarettes, uncomplicated: Secondary | ICD-10-CM | POA: Insufficient documentation

## 2013-07-28 DIAGNOSIS — F17200 Nicotine dependence, unspecified, uncomplicated: Secondary | ICD-10-CM

## 2013-07-28 LAB — CBC WITH DIFFERENTIAL/PLATELET
BASOS ABS: 0 10*3/uL (ref 0.0–0.1)
Basophils Relative: 0.2 % (ref 0.0–3.0)
Eosinophils Absolute: 0.3 10*3/uL (ref 0.0–0.7)
Eosinophils Relative: 2.1 % (ref 0.0–5.0)
HEMATOCRIT: 47.7 % — AB (ref 36.0–46.0)
Hemoglobin: 16.5 g/dL — ABNORMAL HIGH (ref 12.0–15.0)
LYMPHS ABS: 2.2 10*3/uL (ref 0.7–4.0)
Lymphocytes Relative: 17.5 % (ref 12.0–46.0)
MCHC: 34.5 g/dL (ref 30.0–36.0)
MCV: 100.9 fl — ABNORMAL HIGH (ref 78.0–100.0)
MONO ABS: 0.6 10*3/uL (ref 0.1–1.0)
Monocytes Relative: 4.7 % (ref 3.0–12.0)
NEUTROS ABS: 9.3 10*3/uL — AB (ref 1.4–7.7)
Neutrophils Relative %: 75.5 % (ref 43.0–77.0)
Platelets: 386 10*3/uL (ref 150.0–400.0)
RBC: 4.73 Mil/uL (ref 3.87–5.11)
RDW: 14.5 % (ref 11.5–15.5)
WBC: 12.3 10*3/uL — ABNORMAL HIGH (ref 4.0–10.5)

## 2013-07-28 NOTE — Assessment & Plan Note (Signed)
She desperately needs to quit smoking. We discussed this at length. She's not a good candidate for Chantix because of her mental health disease. She has not done well with Wellbutrin either.  Plan: -Nicotine patches -I am not opposed to her using the electronic cigarette if needed

## 2013-07-28 NOTE — Progress Notes (Signed)
Subjective:    Patient ID: Jenny Branch, female    DOB: 1955/12/17, 58 y.o.   MRN: [id number]  HPI  This is a 58 year old female with a past medical history significant for COPD who comes her clinic today for evaluation of the same. She has smoked up to one to one and a half packs of cigarettes for many decades and continues to smoke one and a half packs of cigarettes daily. She's had 2 exacerbations in the last 12 months requiring treatment with prednisone and antibiotics. In the last month she has been started on every other day prednisone by her primary care physician. She notes that she is not taking it every other day but only using it on days when she feels bad.  She says that she's been short of breath for many years primarily on exertion. She notes carrying heavy objects climbing stairs will typically make her short of breath. This has been progressing and is worse in the last few months.  She says that she coughs on regular basis as well.  She has tried to quit smoking many times in the past. When she took Chantix she said she felt like she wanted to kill herself and everybody around her. She took Wellbutrin and that made her quite anxious. She has taken many many classes in smoking cessation and she says that none of them have been effective.  She does not have chest pain or leg swelling. She has gained weight in the last year. She does not have hemoptysis.  Past Medical History  Diagnosis Date  . COPD (chronic obstructive pulmonary disease)   . Asthma   . Hypertension   . Depression   . PTSD (post-traumatic stress disorder)      Family History  Problem Relation Age of Onset  . Rheum arthritis Mother   . Heart failure Father   . Cancer Father     prostate  . Alcoholism Sister      History   Social History  . Marital Status: Divorced    Spouse Name: N/A    Number of Children: N/A  . Years of Education: N/A   Occupational History  . Not on file.   Social  History Main Topics  . Smoking status: Current Every Day Smoker -- 1.50 packs/day for 33 years    Types: Cigarettes  . Smokeless tobacco: Never Used  . Alcohol Use: 8.4 oz/week    14 Glasses of wine per week  . Drug Use: No  . Sexual Activity: Not Currently   Other Topics Concern  . Not on file   Social History Narrative  . No narrative on file     Allergies  Allergen Reactions  . Penicillins Itching and Rash     Outpatient Prescriptions Prior to Visit  Medication Sig Dispense Refill  . albuterol (PROVENTIL HFA;VENTOLIN HFA) 108 (90 BASE) MCG/ACT inhaler Inhale 2 puffs into the lungs every 6 (six) hours as needed for wheezing.  1 Inhaler  11  . albuterol (PROVENTIL) (2.5 MG/3ML) 0.083% nebulizer solution Take 2.5 mg by nebulization every 6 (six) hours as needed.        Marland Kitchen amitriptyline (ELAVIL) 25 MG tablet Take 25 mg by mouth every morning.      . clonazePAM (KLONOPIN) 0.5 MG tablet Take 0.25 mg by mouth 3 (three) times daily as needed for anxiety.      Marland Kitchen dextromethorphan-guaiFENesin (MUCINEX DM) 30-600 MG per 12 hr tablet Take 1-2 tablets by mouth every  12 (twelve) hours. With water For congestion and cough       . fish oil-omega-3 fatty acids 1000 MG capsule Take 1 g by mouth daily.        . fluticasone-salmeterol (ADVAIR HFA) 230-21 MCG/ACT inhaler Inhale 2 puffs into the lungs 2 (two) times daily.       . Fluticasone-Salmeterol (ADVAIR) 500-50 MCG/DOSE AEPB Inhale 1 puff into the lungs 2 (two) times daily.      Marland Kitchen lisinopril-hydrochlorothiazide (PRINZIDE,ZESTORETIC) 10-12.5 MG per tablet Take 1 tablet by mouth daily.      . montelukast (SINGULAIR) 10 MG tablet Take 10 mg by mouth at bedtime.        Marland Kitchen PARoxetine (PAXIL) 10 MG tablet Take 10 mg by mouth every morning.        . tiotropium (SPIRIVA) 18 MCG inhalation capsule Place 1 capsule (18 mcg total) into inhaler and inhale daily.  30 capsule  11  . traZODone (DESYREL) 50 MG tablet Take 50 mg by mouth at bedtime as needed for  sleep.      . predniSONE (DELTASONE) 10 MG tablet Take 50 mg tablet today, continue to taper down by 10 mg daily until completed  15 tablet  0  . ranitidine (ZANTAC) 150 MG tablet Take 150 mg by mouth 2 (two) times daily.        Marland Kitchen aspirin 81 MG tablet Take 81 mg by mouth daily.      Marland Kitchen azithromycin (ZITHROMAX Z-PAK) 250 MG tablet Use as per pharmacy directions  6 each  0  . cholecalciferol (VITAMIN D) 1000 UNITS tablet Take 1,000 Units by mouth daily.        . lansoprazole (PREVACID) 15 MG capsule Take 15 mg by mouth daily at 12 noon.      . potassium chloride (KLOR-CON) 8 MEQ tablet Take 8 mEq by mouth 2 (two) times daily.        . valsartan (DIOVAN) 40 MG tablet Take 60 mg by mouth at bedtime.        No facility-administered medications prior to visit.      Review of Systems  Constitutional: Positive for appetite change and unexpected weight change. Negative for fever.  HENT: Positive for congestion, sneezing and trouble swallowing. Negative for dental problem, ear pain, nosebleeds, postnasal drip, rhinorrhea, sinus pressure and sore throat.   Eyes: Negative for redness and itching.  Respiratory: Positive for cough and shortness of breath. Negative for chest tightness and wheezing.   Cardiovascular: Negative for palpitations and leg swelling.  Gastrointestinal: Negative for nausea and vomiting.  Genitourinary: Negative for dysuria.  Musculoskeletal: Positive for joint swelling.  Skin: Negative for rash.  Neurological: Positive for headaches.  Hematological: Does not bruise/bleed easily.  Psychiatric/Behavioral: Positive for dysphoric mood. The patient is not nervous/anxious.        Objective:   Physical Exam Filed Vitals:   07/28/13 1510  BP: 124/66  Pulse: 99  Height: 5\' 3"  (1.6 m)  Weight: 167 lb (75.751 kg)  SpO2: 95%  RA  Gen: well appearing, no acute distress HEENT: NCAT, PERRL, EOMi, OP clear, neck supple without masses PULM: wheezing and poor air movement  bilaterally CV: RRR, no mgr, no JVD AB: BS+, soft, nontender, no hsm Ext: warm, no edema, no clubbing, no cyanosis Derm: no rash or skin breakdown Neuro: A&Ox4, CN II-XII intact, strength 5/5 in all 4 extremities  Simple spirometry performed today in our clinic> ratio 54%, FEV1 1.03 L (41%) Ambulate 500 feet on  room air> oxygen saturation dropped to 88% very briefly and then recovered to 94% with continued ambulation      Assessment & Plan:   COPD (chronic obstructive pulmonary disease) Clearly she has severe COPD. She is gold grade D. She has had 2 exacerbations in the last 12 months and she has severe airflow obstruction.  This is purely due to her ongoing tobacco use. We discussed this at length today. She is on maximum inhaler therapy and its unclear to me that changing anything that she's on would make any difference. I agree that she should be on chronic prednisone that she's currently not taking it appropriately. The best thing she can do to help her self as to quit smoking.  Plan: -Continue Spiriva and Advair -Take prednisone 10 mg every other day -Chest x-ray today to make sure there is no other etiology -CBC with differential and IgE to look for allergic causes or other possible contributors to her worsening COPD -Oxygen as not indicated based on her ambulatory oxygen saturation  -tobacco use, see below  She would qualify for the IMPACT study we're coordinating with her office. I discussed this with her today and she is interested.   Tobacco use disorder She desperately needs to quit smoking. We discussed this at length. She's not a good candidate for Chantix because of her mental health disease. She has not done well with Wellbutrin either.  Plan: -Nicotine patches -I am not opposed to her using the electronic cigarette if needed   Updated Medication List Outpatient Encounter Prescriptions as of 07/28/2013  Medication Sig  . albuterol (PROVENTIL HFA;VENTOLIN HFA) 108  (90 BASE) MCG/ACT inhaler Inhale 2 puffs into the lungs every 6 (six) hours as needed for wheezing.  Marland Kitchen albuterol (PROVENTIL) (2.5 MG/3ML) 0.083% nebulizer solution Take 2.5 mg by nebulization every 6 (six) hours as needed.    Marland Kitchen amitriptyline (ELAVIL) 25 MG tablet Take 25 mg by mouth every morning.  . clonazePAM (KLONOPIN) 0.5 MG tablet Take 0.25 mg by mouth 3 (three) times daily as needed for anxiety.  Marland Kitchen dextromethorphan-guaiFENesin (MUCINEX DM) 30-600 MG per 12 hr tablet Take 1-2 tablets by mouth every 12 (twelve) hours. With water For congestion and cough   . fish oil-omega-3 fatty acids 1000 MG capsule Take 1 g by mouth daily.    . fluticasone-salmeterol (ADVAIR HFA) 230-21 MCG/ACT inhaler Inhale 2 puffs into the lungs 2 (two) times daily.   . Fluticasone-Salmeterol (ADVAIR) 500-50 MCG/DOSE AEPB Inhale 1 puff into the lungs 2 (two) times daily.  Marland Kitchen lisinopril-hydrochlorothiazide (PRINZIDE,ZESTORETIC) 10-12.5 MG per tablet Take 1 tablet by mouth daily.  . montelukast (SINGULAIR) 10 MG tablet Take 10 mg by mouth at bedtime.    Marland Kitchen PARoxetine (PAXIL) 10 MG tablet Take 10 mg by mouth every morning.    . predniSONE (DELTASONE) 10 MG tablet Take 10 mg by mouth as needed. Take 50 mg tablet today, continue to taper down by 10 mg daily until completed  . tiotropium (SPIRIVA) 18 MCG inhalation capsule Place 1 capsule (18 mcg total) into inhaler and inhale daily.  . traZODone (DESYREL) 50 MG tablet Take 50 mg by mouth at bedtime as needed for sleep.  . [DISCONTINUED] predniSONE (DELTASONE) 10 MG tablet Take 50 mg tablet today, continue to taper down by 10 mg daily until completed  . [DISCONTINUED] ranitidine (ZANTAC) 150 MG tablet Take 150 mg by mouth 2 (two) times daily.    . [DISCONTINUED] aspirin 81 MG tablet Take 81 mg by mouth  daily.  . [DISCONTINUED] azithromycin (ZITHROMAX Z-PAK) 250 MG tablet Use as per pharmacy directions  . [DISCONTINUED] cholecalciferol (VITAMIN D) 1000 UNITS tablet Take 1,000  Units by mouth daily.    . [DISCONTINUED] lansoprazole (PREVACID) 15 MG capsule Take 15 mg by mouth daily at 12 noon.  . [DISCONTINUED] potassium chloride (KLOR-CON) 8 MEQ tablet Take 8 mEq by mouth 2 (two) times daily.    . [DISCONTINUED] valsartan (DIOVAN) 40 MG tablet Take 60 mg by mouth at bedtime.

## 2013-07-28 NOTE — Assessment & Plan Note (Addendum)
Clearly she has severe COPD. She is gold grade D. She has had 2 exacerbations in the last 12 months and she has severe airflow obstruction.  This is purely due to her ongoing tobacco use. We discussed this at length today. She is on maximum inhaler therapy and its unclear to me that changing anything that she's on would make any difference. I agree that she should be on chronic prednisone that she's currently not taking it appropriately. The best thing she can do to help her self as to quit smoking.  Plan: -Continue Spiriva and Advair -Take prednisone 10 mg every other day -Chest x-ray today to make sure there is no other etiology -CBC with differential and IgE to look for allergic causes or other possible contributors to her worsening COPD -Oxygen as not indicated based on her ambulatory oxygen saturation  -tobacco use, see below  She would qualify for the IMPACT study we're coordinating with her office. I discussed this with her today and she is interested.

## 2013-07-28 NOTE — Patient Instructions (Signed)
Quit smoking! Keep taking your Advair and Spiriva as you are doing Use a nicotine patch to help you quit smoking Take the prednisone at 10mg  every other day Get a Chest X-ray and blood work on your way out the door today, we will call you with those results You will be contacted by our research coordinators We will see you back in 2 months or sooner if needed

## 2013-07-29 NOTE — Progress Notes (Signed)
Quick Note:  lmtcb X1 to relay results. ______ 

## 2013-08-02 ENCOUNTER — Telehealth: Payer: Self-pay | Admitting: Pulmonary Disease

## 2013-08-02 MED ORDER — PREDNISONE 10 MG PO TABS: 10.0000 mg | ORAL_TABLET | ORAL | Status: DC

## 2013-08-02 NOTE — Telephone Encounter (Signed)
I spoke with the pt and notified of cxr results  Pt verbalized understanding  I also refilled prednisone per her request

## 2013-08-02 NOTE — Progress Notes (Signed)
Quick Note:  Spoke with pt and notified of results per Dr. McQuaid. Pt verbalized understanding and denied any questions.  ______ 

## 2013-08-02 NOTE — Progress Notes (Signed)
Quick Note:  lmtcb XX2 to relay results. ______

## 2013-08-02 NOTE — Telephone Encounter (Signed)
Notes Recorded by Juanito Doom, MD on 07/29/2013 at 9:25 AM A,  Please let her know that this was OK  Thanks B  LMTCB for the pt

## 2013-08-10 ENCOUNTER — Telehealth: Payer: Self-pay | Admitting: Pulmonary Disease

## 2013-08-10 NOTE — Telephone Encounter (Signed)
Spoke with the pt  She is asking for results of labs  Her CBC is there but was not signed yet- I advised will have Dr. Lake Bells review and advised on results   Dr Lake Bells- the IGE and Alpha 1 were not collected  I called to lab to ask why and was advised they were ordered wrong (not made future) and so they did not see that these labs were needed  Pt has pending ov here on 09/14/13- is it okay to wait until then to have these done or should we have her come sooner?  Please advise on missing labs and cbc results thanks!

## 2013-08-11 NOTE — Telephone Encounter (Signed)
For some reason it never resulted to me Please let her know it is normal

## 2013-08-12 NOTE — Telephone Encounter (Signed)
lmomtcb x1 

## 2013-08-12 NOTE — Telephone Encounter (Signed)
I spoke with patient about results and she verbalized understanding and had no questions 

## 2013-08-12 NOTE — Telephone Encounter (Signed)
ATC pt line busy x 4 wcb 

## 2013-08-12 NOTE — Telephone Encounter (Signed)
Pt returned call

## 2013-09-14 ENCOUNTER — Encounter: Payer: Self-pay | Admitting: Pulmonary Disease

## 2013-09-14 ENCOUNTER — Ambulatory Visit (INDEPENDENT_AMBULATORY_CARE_PROVIDER_SITE_OTHER): Payer: Self-pay | Admitting: Pulmonary Disease

## 2013-09-14 VITALS — BP 126/68 | HR 86 | Ht 63.0 in | Wt 167.0 lb

## 2013-09-14 DIAGNOSIS — F17200 Nicotine dependence, unspecified, uncomplicated: Secondary | ICD-10-CM

## 2013-09-14 DIAGNOSIS — J3089 Other allergic rhinitis: Secondary | ICD-10-CM

## 2013-09-14 DIAGNOSIS — J411 Mucopurulent chronic bronchitis: Secondary | ICD-10-CM

## 2013-09-14 DIAGNOSIS — J309 Allergic rhinitis, unspecified: Secondary | ICD-10-CM | POA: Insufficient documentation

## 2013-09-14 NOTE — Assessment & Plan Note (Signed)
Encouraged to quit. 

## 2013-09-14 NOTE — Assessment & Plan Note (Signed)
She really needs to quit smoking. She can start to taper back on her prednisone, I outlined a plan for her. I'm hoping that all the mucus production is due to her sinus congestion as her lungs sound OK today.  Plan: -quit smoking -wean prednisone down to 5mg  every other day -continue inhalers -f/u three months  Refer for IMPACT study

## 2013-09-14 NOTE — Patient Instructions (Signed)
Use Netti pot rinses with distilled water at least twice per day using the instructions on the package. 1/2 hour after using the [facility] Med rinse, use Nasacort two puffs in each nostril once per day.  Remember that the Nasacort can take 1-2 weeks to work after regular use. Use generic zyrtec (cetirizine) every day.  If this doesn't help, then stop taking it and use chlorpheniramine-phenylephrine combination tablets.  Try to quit smoking  We will have the research coordinators contact you about the IMPACT study  If your mucus production improves after using the regimen above, cut back on the prednisone like we discussed (first go with 10mg  once per day, skip a day, then 5mg  a day, then skip a day, then 10mg  a day, then skip a day, then 5mg ...) if this works then after two weeks go down to 5mg  every other day  We will see you back in 3 months or sooner if needed

## 2013-09-14 NOTE — Progress Notes (Signed)
Subjective:    Patient ID: Jenny Branch, female    DOB: February 21, 1956, 58 y.o.   MRN: [id number]   Synopsis: GOLD Grade D, 07/29/2011 simple spirometry> ratio 54%, FEV1 1.03 L (41% predicted), current smoker as of 2015  HPI  09/14/2013 ROV > Alyha has cut back to 1 pack per day.  This has been hard but she has been successful.  She says that her breathing has been stable since the last visit.  She has been coughing more lately and has been bringing up more phlegm which is white in color.  Rarely yellow in color.  She has also been having more sinus congestion and sinus drainage.  She was treated for an exacerbation by Bertis Ruddy at [facility] in March of this year.    Past Medical History  Diagnosis Date  . COPD (chronic obstructive pulmonary disease)   . Asthma   . Hypertension   . Depression   . PTSD (post-traumatic stress disorder)      Review of Systems  Constitutional: Negative for fever, chills and fatigue.  HENT: Negative for postnasal drip, rhinorrhea and sinus pressure.   Respiratory: Positive for cough and shortness of breath. Negative for wheezing.   Cardiovascular: Negative for chest pain, palpitations and leg swelling.       Objective:   Physical Exam Filed Vitals:   09/14/13 1212  BP: 126/68  Pulse: 86  Height: 5\' 3"  (1.6 m)  Weight: 167 lb (75.751 kg)  SpO2: 100%   RA  Gen: well appearing, no acute distress HEENT: NCAT,  EOMi, OP clear, PULM: CTA B CV: RRR, no mgr, no JVD AB: BS+, soft, nontender, no hsm Ext: warm, no edema, no clubbing, no cyanosis Derm: no rash or skin breakdown   Assessment & Plan:   COPD (chronic obstructive pulmonary disease) She really needs to quit smoking. She can start to taper back on her prednisone, I outlined a plan for her. I'm hoping that all the mucus production is due to her sinus congestion as her lungs sound OK today.  Plan: -quit smoking -wean prednisone down to 5mg  every other day -continue  inhalers -f/u three months  Refer for IMPACT study  Tobacco use disorder Encouraged to quit  Allergic rhinitis Given the following instructions:  Use Neil Med rinses with distilled water at least twice per day using the instructions on the package. 1/2 hour after using the [facility] Med rinse, use Nasacort two puffs in each nostril once per day.  Remember that the Nasacort can take 1-2 weeks to work after regular use. Use generic zyrtec (cetirizine) every day.  If this doesn't help, then stop taking it and use chlorpheniramine-phenylephrine combination tablets.     Updated Medication List Outpatient Encounter Prescriptions as of 09/14/2013  Medication Sig  . albuterol (PROVENTIL HFA;VENTOLIN HFA) 108 (90 BASE) MCG/ACT inhaler Inhale 2 puffs into the lungs every 6 (six) hours as needed for wheezing.  Marland Kitchen albuterol (PROVENTIL) (2.5 MG/3ML) 0.083% nebulizer solution Take 2.5 mg by nebulization every 6 (six) hours as needed.    . clonazePAM (KLONOPIN) 0.5 MG tablet Take 0.25 mg by mouth 3 (three) times daily as needed for anxiety.  Marland Kitchen dextromethorphan-guaiFENesin (MUCINEX DM) 30-600 MG per 12 hr tablet Take 1-2 tablets by mouth every 12 (twelve) hours. With water For congestion and cough   . fish oil-omega-3 fatty acids 1000 MG capsule Take 1 g by mouth daily.    . fluticasone-salmeterol (ADVAIR HFA) 230-21 MCG/ACT inhaler Inhale 2  puffs into the lungs 2 (two) times daily.   . Fluticasone-Salmeterol (ADVAIR) 500-50 MCG/DOSE AEPB Inhale 1 puff into the lungs 2 (two) times daily.  Marland Kitchen lisinopril-hydrochlorothiazide (PRINZIDE,ZESTORETIC) 10-12.5 MG per tablet Take 1 tablet by mouth daily.  Marland Kitchen PARoxetine (PAXIL) 10 MG tablet Take 10 mg by mouth every morning.    . predniSONE (DELTASONE) 10 MG tablet Take 1 tablet (10 mg total) by mouth every other day.  . tiotropium (SPIRIVA) 18 MCG inhalation capsule Place 1 capsule (18 mcg total) into inhaler and inhale daily.  . traZODone (DESYREL) 50 MG tablet Take  50 mg by mouth at bedtime as needed for sleep.  . [DISCONTINUED] amitriptyline (ELAVIL) 25 MG tablet Take 25 mg by mouth every morning.  . [DISCONTINUED] montelukast (SINGULAIR) 10 MG tablet Take 10 mg by mouth at bedtime.

## 2013-09-14 NOTE — Assessment & Plan Note (Signed)
Given the following instructions:  Use Neil Med rinses with distilled water at least twice per day using the instructions on the package. 1/2 hour after using the [facility] Med rinse, use Nasacort two puffs in each nostril once per day.  Remember that the Nasacort can take 1-2 weeks to work after regular use. Use generic zyrtec (cetirizine) every day.  If this doesn't help, then stop taking it and use chlorpheniramine-phenylephrine combination tablets.

## 2013-12-06 ENCOUNTER — Telehealth: Payer: Self-pay | Admitting: Pulmonary Disease

## 2013-12-06 NOTE — Telephone Encounter (Signed)
lmomtcb x1 

## 2013-12-07 NOTE — Telephone Encounter (Signed)
Pt returned call [phone]

## 2013-12-07 NOTE — Telephone Encounter (Signed)
ATC. Line rang several times with no option of VM. WCB.

## 2013-12-07 NOTE — Telephone Encounter (Signed)
Called # listed below for pt and was told had wrong #. Called [phone] Spoke with pt. She reports she has never had a PNA shot. She wants to know if we can send in an RX for this to [facility] to administer to her. Please advise if pt needs one and which PNA vaccine? thanks

## 2013-12-07 NOTE — Telephone Encounter (Signed)
Start with pneumovax, next visit with [place] will get prevnar. I'm OK with Rx

## 2013-12-07 NOTE — Telephone Encounter (Signed)
LMTCB

## 2013-12-08 MED ORDER — PNEUMOCOCCAL VAC POLYVALENT 25 MCG/0.5ML IJ INJ: 0.5000 mL | INJECTION | Freq: Once | INTRAMUSCULAR | Status: DC

## 2013-12-08 NOTE — Telephone Encounter (Signed)
Pt returned call & can be reached at [phone].  Jenny Branch

## 2013-12-08 NOTE — Telephone Encounter (Signed)
Attempted to call pt but line is busy.  Will try back.

## 2013-12-08 NOTE — Telephone Encounter (Signed)
Spoke with the pt and notified of recs per BQ  She verbalized understanding  Nothing further needed Rx was sent

## 2014-01-02 ENCOUNTER — Ambulatory Visit (INDEPENDENT_AMBULATORY_CARE_PROVIDER_SITE_OTHER): Payer: Self-pay | Admitting: Pulmonary Disease

## 2014-01-02 ENCOUNTER — Encounter: Payer: Self-pay | Admitting: Pulmonary Disease

## 2014-01-02 VITALS — BP 116/78 | HR 84 | Temp 97.6°F | Ht 63.0 in | Wt 159.8 lb

## 2014-01-02 DIAGNOSIS — Z720 Tobacco use: Secondary | ICD-10-CM

## 2014-01-02 DIAGNOSIS — F17200 Nicotine dependence, unspecified, uncomplicated: Secondary | ICD-10-CM

## 2014-01-02 DIAGNOSIS — Z23 Encounter for immunization: Secondary | ICD-10-CM

## 2014-01-02 DIAGNOSIS — J411 Mucopurulent chronic bronchitis: Secondary | ICD-10-CM

## 2014-01-02 MED ORDER — PREDNISONE 5 MG PO TABS: 5.0000 mg | ORAL_TABLET | Freq: Every day | ORAL | Status: DC

## 2014-01-02 NOTE — Patient Instructions (Signed)
Take the prednisone at 5mg  daily  Quit smoking Keep taking your inhalers We will have our clinical trial coordinators contact you We will see you back in 6 months or sooner if needed

## 2014-01-02 NOTE — Assessment & Plan Note (Signed)
She has severe COPD which is only been controlled with daily prednisone. Unfortunately she continues to smoke. If she would quit smoking then we will have to use the prednisone.  She may be a candidate for some of our clinical trialswith biologic agents as her total eosinophil count was 300 earlier this year.  Her flu shot is up-to-date.  Plan: -Continue prednisone 5 mg daily -referral for clinical trial evaluation -Continue Advair and Spiriva -Educated at length to quit smoking -Prevnar vaccination today

## 2014-01-02 NOTE — Assessment & Plan Note (Signed)
Advised at length to quit smoking today

## 2014-01-02 NOTE — Progress Notes (Signed)
Subjective:    Patient ID: Jenny Branch, female    DOB: Jan 19, 1956, 58 y.o.   MRN: [id number]   Synopsis: GOLD Grade D, 07/29/2011 simple spirometry> ratio 54%, FEV1 1.03 L (41% predicted), current smoker as of 2015  HPI  01/02/2014 ROV > Jenny Branch says that her shakes have been getting worse lately .  She think that it is due to the medicine.  She says that being on the prednisone has helped more and sh is no longer having flares of the COPD.  She is still smoking 1 pack per day, sometimes more than that with stress.  She continues to take ventolin prn and advair bid and spiriva daily.  She is religious about taking these. No flares of COPD since the last visit.  She had a flu shot two months ago and she had the new pneumonia shot a couple of weeks ago.    Past Medical History  Diagnosis Date  . COPD (chronic obstructive pulmonary disease)   . Asthma   . Hypertension   . Depression   . PTSD (post-traumatic stress disorder)      Review of Systems  Constitutional: Negative for fever, chills and fatigue.  HENT: Negative for postnasal drip, rhinorrhea and sinus pressure.   Respiratory: Negative for cough, shortness of breath and wheezing.   Cardiovascular: Negative for chest pain, palpitations and leg swelling.       Objective:   Physical Exam  Filed Vitals:   01/02/14 1521  BP: 116/78  Pulse: 84  Temp: 97.6 F (36.4 C)  TempSrc: Oral  Height: 5\' 3"  (1.6 m)  Weight: 159 lb 12.8 oz (72.485 kg)  SpO2: 97%   RA  Gen: well appearing, no acute distress HEENT: NCAT,  EOMi, OP clear, PULM: few wheezes in bases otherwise clear CV: RRR, no mgr, no JVD AB: BS+, soft, nontender,  Ext: warm, no edema, no clubbing, no cyanosis Derm: no rash or skin breakdown     Assessment & Plan:   COPD (chronic obstructive pulmonary disease) She has severe COPD which is only been controlled with daily prednisone. Unfortunately she continues to smoke. If she would quit smoking then we will have  to use the prednisone.  She may be a candidate for some of our clinical trialswith biologic agents as her total eosinophil count was 300 earlier this year.  Her flu shot is up-to-date.  Plan: -Continue prednisone 5 mg daily -referral for clinical trial evaluation -Continue Advair and Spiriva -Educated at length to quit smoking -Prevnar vaccination today  Tobacco use disorder Advised at length to quit smoking today    Updated Medication List Outpatient Encounter Prescriptions as of 01/02/2014  Medication Sig  . albuterol (PROVENTIL HFA;VENTOLIN HFA) 108 (90 BASE) MCG/ACT inhaler Inhale 2 puffs into the lungs every 6 (six) hours as needed for wheezing.  Marland Kitchen albuterol (PROVENTIL) (2.5 MG/3ML) 0.083% nebulizer solution Take 2.5 mg by nebulization every 6 (six) hours as needed.    . clonazePAM (KLONOPIN) 0.5 MG tablet Take 0.25 mg by mouth 3 (three) times daily as needed for anxiety.  Marland Kitchen dextromethorphan-guaiFENesin (MUCINEX DM) 30-600 MG per 12 hr tablet Take 1-2 tablets by mouth every 12 (twelve) hours. With water For congestion and cough   . fish oil-omega-3 fatty acids 1000 MG capsule Take 1 g by mouth daily.    . Fluticasone-Salmeterol (ADVAIR) 500-50 MCG/DOSE AEPB Inhale 1 puff into the lungs 2 (two) times daily.  Marland Kitchen lisinopril-hydrochlorothiazide (PRINZIDE,ZESTORETIC) 10-12.5 MG per tablet Take 1  tablet by mouth daily.  Marland Kitchen PARoxetine (PAXIL) 10 MG tablet Take 10 mg by mouth every morning.    . predniSONE (DELTASONE) 10 MG tablet Take 1 tablet (10 mg total) by mouth every other day.  . tiotropium (SPIRIVA) 18 MCG inhalation capsule Place 1 capsule (18 mcg total) into inhaler and inhale daily.  . traZODone (DESYREL) 50 MG tablet Take 50 mg by mouth at bedtime as needed for sleep.  . [DISCONTINUED] fluticasone-salmeterol (ADVAIR HFA) 230-21 MCG/ACT inhaler Inhale 2 puffs into the lungs 2 (two) times daily.   . [DISCONTINUED] pneumococcal 23 valent vaccine (PNEUMOVAX 23) 25 MCG/0.5ML  injection Inject 0.5 mLs into the muscle once.

## 2014-02-27 ENCOUNTER — Other Ambulatory Visit: Payer: Self-pay

## 2014-02-27 MED ORDER — PREDNISONE 5 MG PO TABS: 5.0000 mg | ORAL_TABLET | Freq: Every day | ORAL | Status: DC

## 2014-03-07 ENCOUNTER — Telehealth: Payer: Self-pay | Admitting: Pulmonary Disease

## 2014-03-09 NOTE — Telephone Encounter (Signed)
Lm for pt regarding where she wants her meds sent.  I have no forms other than the med list that the pt dropped off.  After googling MAP, it [place] and I'm not sure where I'm supposed to send these medications.

## 2014-03-09 NOTE — Telephone Encounter (Signed)
Received list of meds in Dr Anastasia Pall look at.  Meds listed are ventolin, advair, spiriva and nasonex.  Does pt need all these refilled and also Nasonex is not on current med list.  [facility] for pt to call regarding what refills are needed.

## 2014-03-13 ENCOUNTER — Other Ambulatory Visit: Payer: Self-pay

## 2014-03-13 MED ORDER — ALBUTEROL SULFATE HFA 108 (90 BASE) MCG/ACT IN AERS: 2.0000 | INHALATION_SPRAY | Freq: Four times a day (QID) | RESPIRATORY_TRACT | Status: DC | PRN

## 2014-03-13 MED ORDER — TIOTROPIUM BROMIDE MONOHYDRATE 18 MCG IN CAPS: 18.0000 ug | ORAL_CAPSULE | Freq: Every day | RESPIRATORY_TRACT | Status: DC

## 2014-03-13 MED ORDER — FLUTICASONE-SALMETEROL 500-50 MCG/DOSE IN AEPB: 1.0000 | INHALATION_SPRAY | Freq: Two times a day (BID) | RESPIRATORY_TRACT | Status: DC

## 2014-03-13 MED ORDER — MOMETASONE FUROATE 50 MCG/ACT NA SUSP: 2.0000 | Freq: Every day | NASAL | Status: DC

## 2014-03-13 NOTE — Telephone Encounter (Signed)
These have been faxed to [facility].  Nothing further needed.

## 2014-06-29 ENCOUNTER — Encounter: Payer: Self-pay | Admitting: Pulmonary Disease

## 2014-06-29 ENCOUNTER — Ambulatory Visit (INDEPENDENT_AMBULATORY_CARE_PROVIDER_SITE_OTHER): Payer: Medicare Other | Admitting: Pulmonary Disease

## 2014-06-29 VITALS — BP 128/68 | HR 95 | Ht 63.0 in | Wt 157.0 lb

## 2014-06-29 DIAGNOSIS — J438 Other emphysema: Secondary | ICD-10-CM

## 2014-06-29 DIAGNOSIS — I10 Essential (primary) hypertension: Secondary | ICD-10-CM | POA: Insufficient documentation

## 2014-06-29 DIAGNOSIS — F411 Generalized anxiety disorder: Secondary | ICD-10-CM | POA: Diagnosis not present

## 2014-06-29 MED ORDER — LOSARTAN POTASSIUM-HCTZ 50-12.5 MG PO TABS: 1.0000 | ORAL_TABLET | Freq: Every day | ORAL | Status: AC

## 2014-06-29 MED ORDER — PAROXETINE HCL 10 MG PO TABS: 10.0000 mg | ORAL_TABLET | ORAL | Status: DC

## 2014-06-29 NOTE — Assessment & Plan Note (Signed)
This has been a stable interval for her but she has an unhealthy dependence on prednisone. If she would quit smoking she would not need to monitor prednisone. Today on exam her lungs are clear. I explained to her at length today that the ongoing tobacco use is contributing to worsening symptoms.  Plan: Continue Spiriva and Advair Quit smoking

## 2014-06-29 NOTE — Patient Instructions (Addendum)
Quit smoking Keep taking Spiriva and Advair Use Ventolin on an as-needed basis for shortness of breath Stop taking the lisinopril and HCTZ Start taking Losartan-HCTZ  Get a Primary care doctor We will see you back in 6 months or sooner if needed

## 2014-06-29 NOTE — Assessment & Plan Note (Signed)
She says that this is continuing to contribute to her tobacco use. She is asking for clonazepam.  I explained to her that I will not refill the clonazepam as I have not manage her anxiety in the past, however I will prescribed paroxetine to continue. I strongly advised her to get a primary care physician to manage this.

## 2014-06-29 NOTE — Assessment & Plan Note (Signed)
Due to ongoing cough will change lisinopril to losartan, continue hydrochlorothiazide  Advised her at length to get a primary care physician to manage this medication

## 2014-06-29 NOTE — Progress Notes (Signed)
Subjective:    Patient ID: Jenny Branch, female    DOB: [birth year], 59 y.o.   MRN: [id number]   Synopsis: GOLD Grade D, 07/29/2011 simple spirometry> ratio 54%, FEV1 1.03 L (41% predicted), current smoker as of 2015  HPI Chief Complaint  Patient presents with  . Follow-up    pt c/o difficulty breathing d/t seasonal allergies, having increased sob, mucus production.     Jenny Branch got Medicare since the last visit on the basis of disability and she says that this has bene helpful for her financially.  She says that for the last few months she has been experiencing more dyspnea, wheezing, and chest tightness.  She had more of this around [place] and took some prednisone she had at home.  No fevers or chlils at the time.  She did have thick white mucus production this has been more of a problem lately.    She is currently taking Ventolin.  She uses Advair and and Spiriva regularly.  She tried the Xopenx which didn't help any more than the Ventolin.  She uses the Ventolin 3-4 times per day. She says this is worse when she has been outside.  She says that the dog may make her more dyspneic.   She continues to smoke 1-1.5 ppd.    Past Medical History  Diagnosis Date  . COPD (chronic obstructive pulmonary disease)   . Asthma   . Hypertension   . Depression   . PTSD (post-traumatic stress disorder)      Review of Systems  Constitutional: Negative for fever, chills and fatigue.  HENT: Negative for postnasal drip, rhinorrhea and sinus pressure.   Respiratory: Positive for cough. Negative for shortness of breath and wheezing.   Cardiovascular: Negative for chest pain, palpitations and leg swelling.       Objective:   Physical Exam Filed Vitals:   06/29/14 1444  BP: 128/68  Pulse: 95  Height: 5\' 3"  (1.6 m)  Weight: 157 lb (71.215 kg)  SpO2: 95%   RA  Gen: well appearing, no acute distress HEENT: NCAT,  EOMi, OP clear, PULM: CTA B CV: RRR, no mgr, no JVD AB: BS+, soft, nontender,    Ext: warm, no edema, no clubbing, no cyanosis Derm: no rash or skin breakdown     Assessment & Plan:   COPD (chronic obstructive pulmonary disease) This has been a stable interval for her but she has an unhealthy dependence on prednisone. If she would quit smoking she would not need to monitor prednisone. Today on exam her lungs are clear. I explained to her at length today that the ongoing tobacco use is contributing to worsening symptoms.  Plan: Continue Spiriva and Advair Quit smoking   Essential hypertension Due to ongoing cough will change lisinopril to losartan, continue hydrochlorothiazide  Advised her at length to get a primary care physician to manage this medication   Anxiety state She says that this is continuing to contribute to her tobacco use. She is asking for clonazepam.  I explained to her that I will not refill the clonazepam as I have not manage her anxiety in the past, however I will prescribed paroxetine to continue. I strongly advised her to get a primary care physician to manage this.     Updated Medication List Outpatient Encounter Prescriptions as of 06/29/2014  Medication Sig  . albuterol (PROVENTIL HFA;VENTOLIN HFA) 108 (90 BASE) MCG/ACT inhaler Inhale 2 puffs into the lungs every 6 (six) hours as needed for wheezing.  Marland Kitchen  albuterol (PROVENTIL) (2.5 MG/3ML) 0.083% nebulizer solution Take 2.5 mg by nebulization every 6 (six) hours as needed.    . clonazePAM (KLONOPIN) 0.5 MG tablet Take 0.25 mg by mouth 3 (three) times daily as needed for anxiety.  Marland Kitchen dextromethorphan-guaiFENesin (MUCINEX DM) 30-600 MG per 12 hr tablet Take 1-2 tablets by mouth every 12 (twelve) hours. With water For congestion and cough   . fish oil-omega-3 fatty acids 1000 MG capsule Take 1 g by mouth daily.    . Fluticasone-Salmeterol (ADVAIR) 500-50 MCG/DOSE AEPB Inhale 1 puff into the lungs 2 (two) times daily.  Marland Kitchen lisinopril-hydrochlorothiazide (PRINZIDE,ZESTORETIC) 10-12.5 MG per  tablet Take 1 tablet by mouth daily.  . mometasone (NASONEX) 50 MCG/ACT nasal spray Place 2 sprays into the nose daily.  Marland Kitchen PARoxetine (PAXIL) 10 MG tablet Take 10 mg by mouth every morning.    . predniSONE (DELTASONE) 5 MG tablet Take 1 tablet (5 mg total) by mouth daily with breakfast.  . tiotropium (SPIRIVA) 18 MCG inhalation capsule Place 1 capsule (18 mcg total) into inhaler and inhale daily.  . traZODone (DESYREL) 50 MG tablet Take 50 mg by mouth at bedtime as needed for sleep.

## 2014-07-26 DIAGNOSIS — E559 Vitamin D deficiency, unspecified: Secondary | ICD-10-CM | POA: Diagnosis not present

## 2014-07-26 DIAGNOSIS — R252 Cramp and spasm: Secondary | ICD-10-CM | POA: Diagnosis not present

## 2014-07-26 DIAGNOSIS — F17200 Nicotine dependence, unspecified, uncomplicated: Secondary | ICD-10-CM | POA: Diagnosis not present

## 2014-07-26 DIAGNOSIS — J449 Chronic obstructive pulmonary disease, unspecified: Secondary | ICD-10-CM | POA: Diagnosis not present

## 2014-07-26 DIAGNOSIS — I10 Essential (primary) hypertension: Secondary | ICD-10-CM | POA: Diagnosis not present

## 2014-07-26 DIAGNOSIS — F418 Other specified anxiety disorders: Secondary | ICD-10-CM | POA: Diagnosis not present

## 2014-07-26 DIAGNOSIS — F5101 Primary insomnia: Secondary | ICD-10-CM | POA: Diagnosis not present

## 2014-11-08 ENCOUNTER — Telehealth: Payer: Self-pay | Admitting: Pulmonary Disease

## 2014-11-08 NOTE — Telephone Encounter (Signed)
lmtcb X1 on [place].

## 2014-11-09 NOTE — Telephone Encounter (Signed)
Called [facility] and then was given VM as the pharm did not answer LMTCB x2

## 2014-11-10 NOTE — Telephone Encounter (Signed)
Spoke with pharmacist.  Pt is requesting early refill on Ventolin inhaler.  She received 3 inhalers on 08/21/14.  Please advise if ok to refill Ventolin.

## 2014-11-12 NOTE — Telephone Encounter (Signed)
Yes, OK to refill 

## 2014-11-13 ENCOUNTER — Telehealth: Payer: Self-pay | Admitting: Pulmonary Disease

## 2014-11-13 MED ORDER — ALBUTEROL SULFATE HFA 108 (90 BASE) MCG/ACT IN AERS: 2.0000 | INHALATION_SPRAY | Freq: Four times a day (QID) | RESPIRATORY_TRACT | Status: DC | PRN

## 2014-11-13 NOTE — Telephone Encounter (Signed)
Called pharmacy and was given VM--lmtcb x1

## 2014-11-13 NOTE — Telephone Encounter (Signed)
Called and spoke with pt Pt was wondering if refill had been sent to [facility] for ventolin Informed pt that refill was sent this morning electronically and to check with her pharmacy Informed pt to call office back if not received  Nothing further is needed at this time

## 2014-11-13 NOTE — Telephone Encounter (Signed)
RX sent to pharmacy. Nothing further needed. 

## 2014-11-14 ENCOUNTER — Encounter: Payer: Self-pay | Admitting: Internal Medicine

## 2014-11-14 ENCOUNTER — Ambulatory Visit (INDEPENDENT_AMBULATORY_CARE_PROVIDER_SITE_OTHER): Payer: Medicare Other | Admitting: Internal Medicine

## 2014-11-14 ENCOUNTER — Telehealth: Payer: Self-pay | Admitting: Internal Medicine

## 2014-11-14 VITALS — BP 112/78 | HR 109 | Ht 63.0 in | Wt 161.4 lb

## 2014-11-14 DIAGNOSIS — Z720 Tobacco use: Secondary | ICD-10-CM | POA: Diagnosis not present

## 2014-11-14 DIAGNOSIS — F17210 Nicotine dependence, cigarettes, uncomplicated: Secondary | ICD-10-CM | POA: Diagnosis not present

## 2014-11-14 DIAGNOSIS — J449 Chronic obstructive pulmonary disease, unspecified: Secondary | ICD-10-CM | POA: Diagnosis not present

## 2014-11-14 MED ORDER — ALBUTEROL SULFATE (2.5 MG/3ML) 0.083% IN NEBU: 2.5000 mg | INHALATION_SOLUTION | RESPIRATORY_TRACT | Status: DC | PRN

## 2014-11-14 MED ORDER — NYSTATIN 100000 UNIT/ML MT SUSP: 5.0000 mL | Freq: Four times a day (QID) | OROMUCOSAL | Status: DC

## 2014-11-14 MED ORDER — BUDESONIDE-FORMOTEROL FUMARATE 160-4.5 MCG/ACT IN AERO: INHALATION_SPRAY | RESPIRATORY_TRACT | Status: DC

## 2014-11-14 NOTE — Telephone Encounter (Signed)
Called and spoke to Jenny Branch. Jenny Branch did not receive the albuterol hfa script that was sent electronically on 9.12.16. Gave Jenny Branch a verbal order for Albuterol hfa for 3 inhalers with 0 refills. Jenny Branch verbalized understanding and denied any further questions or concerns at this time. Please see phone note from 9.7.2016 for more information. Nothing further needed at this time.

## 2014-11-14 NOTE — Progress Notes (Signed)
Subjective:    Patient ID: Jenny Branch, female    DOB: 07-08-1955     MRN: [id number]   Synopsis: GOLD Grade D, 07/29/2011 simple spirometry> ratio 54%, FEV1 1.03 L (41% predicted), current smoker as of 2015  HPI [place] of 06/29/14  Chief Complaint  Patient presents with  . Follow-up    pt c/o difficulty breathing d/t seasonal allergies, having increased sob, mucus production.     Jenny Branch got Medicare since the last visit on the basis of disability and she says that this has bene helpful for her financially.  She says that for the last few months she has been experiencing more dyspnea, wheezing, and chest tightness.  She had more of this around [place] and took some prednisone she had at home.  No fevers or chlils at the time.  She did have thick white mucus production this has been more of a problem lately.   She is currently taking Ventolin.  She uses Advair and and Spiriva regularly.  She tried the Xopenx which didn't help any more than the Ventolin.  She uses the Ventolin 3-4 times per day. She says this is worse when she has been outside.  She says that the dog may make her more dyspneic.  She continues to smoke 1-1.5 ppd.   rec Quit smoking Keep taking Spiriva and Advair Use Ventolin on an as-needed basis for shortness of breath Stop taking the lisinopril and HCTZ Start taking Losartan-HCTZ     11/14/2014  acute ov/Wert re: sob and cough on advair 500/spiriva dpi  off acei  X 4 m Chief Complaint  Patient presents with  . Acute Visit    Pt c/o increased SOB for the past month. She also c/o prod cough with clear sputum. She states she feels SOB all of the time, esp worse with exertion and has also noticed wheezing. She is using rescue inhaler approx 4 x per day and neb with albuterol about 2 x per day.     Typically sept  Is her worse month for breathing/ still smoking/ sob much worse x 3 weeks bur prior did not need much saba even in hfa form, now using neb up to 3 x daily / uses  on avg pred 5 mg twice weekly but now started on 20 mg daily with harsh barking quality dry cough day > noct  No obvious day to day or daytime variability or assoc excess / purulent mucus or cp or chest tightness, subjective wheeze or overt sinus or hb symptoms. No unusual exp hx or h/o childhood pna/ asthma or knowledge of premature birth.  Sleeping ok without nocturnal  or early am exacerbation  of respiratory  c/o's or need for noct saba. Also denies any obvious fluctuation of symptoms with weather or environmental changes or other aggravating or alleviating factors except as outlined above   Current Medications, Allergies, Complete Past Medical History, Past Surgical History, Family History, and Social History were reviewed in [employer] record.  ROS  The following are not active complaints unless bolded sore throat, dysphagia, dental problems, itching, sneezing,  nasal congestion or excess/ purulent secretions, ear ache,   fever, chills, sweats, unintended wt loss, classically pleuritic or exertional cp, hemoptysis,  orthopnea pnd or leg swelling, presyncope, palpitations, abdominal pain, anorexia, nausea, vomiting, diarrhea  or change in bowel or bladder habits, change in stools or urine, dysuria,hematuria,  rash, arthralgias, visual complaints, headache, numbness, weakness or ataxia or problems with walking  or coordination,  change in mood/affect or memory.            Past Medical History  Diagnosis Date  . COPD (chronic obstructive pulmonary disease)   . Asthma   . Hypertension   . Depression   . PTSD (post-traumatic stress disorder)            Objective:   Physical Exam   amb very hoarse wf nad obviously cushingnoid / harsh upper airway cough   Wt Readings from Last 3 Encounters:  11/14/14 161 lb 6.4 oz (73.211 kg)  06/29/14 157 lb (71.215 kg)  01/02/14 159 lb 12.8 oz (72.485 kg)    Vital signs reviewed   Gen: well appearing, no acute  distress HEENT: NCAT,  EOMi, OP clear, PULM: distant bs bilaterally with mostly pseudowheeze  CV: RRR, no mgr, no JVD AB: BS+, soft, nontender,  Ext: warm, no edema, no clubbing, no cyanosis Derm: no rash or skin breakdown       Assessment & Plan:

## 2014-11-14 NOTE — Patient Instructions (Signed)
Stop advair and start symbicort 160 Take 2 puffs first thing in am and then another 2 puffs about 12 hours later.   Only use your albuterol inhaler as a rescue medication to be used if you can't catch your breath by resting or doing a relaxed purse lip breathing pattern.  - The less you use it, the better it will work when you need it. - Ok to use up to 2 puffs  every 4 hours if you must but call for immediate appointment if use goes up over your usual need - Don't leave home without it !!  (think of it like the spare tire for your car)   Only use your nebulizer if you try the inhaler first and it doesn't work  The key is to stop smoking completely before smoking completely stops you!   Nystatin liquid as needed  Keep your follow up with Dr Lake Bells

## 2014-11-14 NOTE — Telephone Encounter (Signed)
Return call from dawn @ [phone].Jenny Branch

## 2014-11-14 NOTE — Telephone Encounter (Signed)
lmtcb for [employer]

## 2014-11-15 ENCOUNTER — Encounter: Payer: Self-pay | Admitting: Internal Medicine

## 2014-11-15 NOTE — Assessment & Plan Note (Addendum)
07/29/2011 simple spirometry> ratio 54%, FEV1 1.03 L (41% predicted) - try off advair 11/14/2014 and on symbicort 160 2bid due to cough   actue flare of symptoms refractory so fare to prednisone DDX of  difficult airways management all start with A and  include Adherence, Ace Inhibitors, Acid Reflux, Active Sinus Disease, Alpha 1 Antitripsin deficiency, Anxiety masquerading as Airways dz,  ABPA,  allergy(esp in young), Aspiration (esp in elderly), Adverse effects of meds,  Active smokers, A bunch of PE's (a small clot burden can't cause this syndrome unless there is already severe underlying pulm or vascular dz with poor reserve) plus two Bs  = Bronchiectasis and Beta blocker use..and one C= CHF  Adherence is always the initial "prime suspect" and is a multilayered concern that requires a "trust but verify" approach in every patient - starting with knowing how to use medications, especially inhalers, correctly, keeping up with refills and understanding the fundamental difference between maintenance and prns vs those medications only taken for a very short course and then stopped and not refilled.  The proper method of use, as well as anticipated side effects, of a metered-dose inhaler are discussed and demonstrated to the patient. Improved effectiveness after extensive coaching during this visit to a level of approximately  75%     ? Adverse effects of dpi, esp advair > try hfa symbicort and if still hoarse /psuedowheeze consider change to  respimat spiriva too   ? Allergy "to fall"  - I seriously doubt she has a significant allergy beyond allergies to cigarettes and I question why she still needs prednisone maintenance (note cushingnoid habitus) although certainly for now she should use  the doses she's used previously to control this and hopefully tapered completely off in the future if Symbicort proves truly to be effective  Active smoking discussed elsewhere  ? Acid (or non-acid) GERD > always  difficult to exclude as up to 75% of pts in some series report no assoc GI/ Heartburn symptoms> I would not hesitate given the severity of her symptoms to next  rec max (24h)  acid suppression and diet restrictions if not able to get her off steroids.   I had an extended discussion with the patient reviewing all relevant studies completed to date and  lasting 70minutes of a 40 minute visit    Each maintenance medication was reviewed in detail including most importantly the difference between maintenance and prns and under what circumstances the prns are to be triggered using an action plan format that is not reflected in the computer generated alphabetically organized AVS.    Please see instructions for details which were reviewed in writing and the patient given a copy highlighting the part that I personally wrote and discussed at today's ov.

## 2014-11-15 NOTE — Assessment & Plan Note (Signed)
>   3 m  I took an extended  opportunity with this patient to outline the consequences of continued cigarette use  in airway disorders based on all the data we have from the multiple national lung health studies (perfomed over decades at millions of dollars in cost)  indicating that smoking cessation, not choice of inhalers or physicians, is the most important aspect of care.   

## 2014-12-07 ENCOUNTER — Encounter: Payer: Self-pay | Admitting: Adult Health

## 2014-12-07 ENCOUNTER — Telehealth: Payer: Self-pay | Admitting: Pulmonary Disease

## 2014-12-07 ENCOUNTER — Ambulatory Visit (INDEPENDENT_AMBULATORY_CARE_PROVIDER_SITE_OTHER): Payer: Medicare Other | Admitting: Adult Health

## 2014-12-07 VITALS — BP 134/86 | HR 84 | Ht 63.0 in | Wt 167.0 lb

## 2014-12-07 DIAGNOSIS — F17210 Nicotine dependence, cigarettes, uncomplicated: Secondary | ICD-10-CM

## 2014-12-07 DIAGNOSIS — Z720 Tobacco use: Secondary | ICD-10-CM | POA: Diagnosis not present

## 2014-12-07 DIAGNOSIS — J441 Chronic obstructive pulmonary disease with (acute) exacerbation: Secondary | ICD-10-CM | POA: Diagnosis not present

## 2014-12-07 MED ORDER — DOXYCYCLINE HYCLATE 100 MG PO TABS: 100.0000 mg | ORAL_TABLET | Freq: Two times a day (BID) | ORAL | Status: DC

## 2014-12-07 MED ORDER — PREDNISONE 10 MG PO TABS: ORAL_TABLET | ORAL | Status: DC

## 2014-12-07 MED ORDER — LEVALBUTEROL HCL 0.63 MG/3ML IN NEBU
0.6300 mg | INHALATION_SOLUTION | Freq: Once | RESPIRATORY_TRACT | Status: AC
  Administered 2014-12-07: 0.63 mg via RESPIRATORY_TRACT

## 2014-12-07 NOTE — Assessment & Plan Note (Signed)
Smoking cessation  

## 2014-12-07 NOTE — Telephone Encounter (Signed)
Called spoke with patient who reports increased SOB this week. She thought she could manage it on her own with prednisone (was taking 35mg  daily and 45mg  yesterday).  Also c/o wheezing, chest tightness, dry cough.  Night sweats last night.  Denies any fever, hemoptysis.   Taking mucus relief tablets that have been helping  Pt requesting appt today Appt scheduled with TP for 3pm today Nothing further needed; will sign off

## 2014-12-07 NOTE — Patient Instructions (Addendum)
I recommend you be admitted to hospital, if you change your mind, call us back or go to ER .  Doxycycline 100mg  Twice daily  , take with food, wear sunscreen .  Prednisone taper over next week.  Mucinex DM Twice daily  As needed cough and congestion  Stop smoking  follow up Dr. Lake Bells as planned this month and As needed   Please contact office for sooner follow up if symptoms do not improve or worsen or seek emergency care

## 2014-12-07 NOTE — Progress Notes (Signed)
   Subjective:    Patient ID: Jenny Branch, female    DOB: 1955-08-29, 59 y.o.   MRN: [id number]  HPI 59 yo female smoker GOLD D COPD on chronic steroids   TEST  07/29/2011 simple spirometry> ratio 54%, FEV1 1.03 L (41% predicted), current smoker as of 2015   12/07/2014 Acute OV :  Pt presents for an acute office visit . She complains of increased SOB, chest tightness/congestion, and occasional prod cough with white mucus x 1 week. She was seen 3 weeks ago , changed from Advair to Symbicort . Says she felt better for few days but symptoms started to get worse with increased cough, congestion, wheezing, and dyspnea. She continues to smoke. Used her albuterol neb for first time last night and felt some better. She increased her prednisone for few days with some help. She is normally on 5mg  daily . Went up to 20mg . daily . She denies fever, chest pain, orthopnea, edema.  O2 sats were 96%.    Review of Systems Constitutional:   No  weight loss, night sweats,  Fevers, chills,  +fatigue, or  lassitude.  HEENT:   No headaches,  Difficulty swallowing,  Tooth/dental problems, or  Sore throat,                No sneezing, itching, ear ache, nasal congestion, post nasal drip,   CV:  No chest pain,  Orthopnea, PND, swelling in lower extremities, anasarca, dizziness, palpitations, syncope.   GI  No heartburn, indigestion, abdominal pain, nausea, vomiting, diarrhea, change in bowel habits, loss of appetite, bloody stools.   Resp:    No chest wall deformity  Skin: no rash or lesions.  GU: no dysuria, change in color of urine, no urgency or frequency.  No flank pain, no hematuria   MS:  No joint pain or swelling.  No decreased range of motion.  No back pain.  Psych:  No change in mood or affect. No depression or anxiety.  No memory loss.         Objective:   Physical Exam GEN: A/Ox3; pleasant , NAD,chronically ill appearing  Vital signs reviewed   HEENT:  [place]/AT,  EACs-clear, TMs-wnl,  NOSE-clear, THROAT-clear, no lesions, no postnasal drip or exudate noted. No stridor   NECK:  Supple w/ fair ROM; no JVD; normal carotid impulses w/o bruits; no thyromegaly or nodules palpated; no lymphadenopathy.  RESP  Faint exp wheeze , +tachypnea   CARD:  RRR, no m/r/g  , no peripheral edema, pulses intact, no cyanosis or clubbing.  GI:   Soft & nt; nml bowel sounds; no organomegaly or masses detected.  Musco: Warm bil, no deformities or joint swelling noted.   Neuro: alert, no focal deficits noted.  Anxious   Skin: Warm, no lesions or rashes         Assessment & Plan:

## 2014-12-07 NOTE — Assessment & Plan Note (Signed)
Recurrent flare in active smoker  Pt given xopenex neb in office  Advised pt for hospital admit, however she declined x2 despite encouragement  She agrees that if she worsens or does not improve will go to ER   Plan  I recommend you be admitted to hospital, if you change your mind, call us back or go to ER .  Doxycycline 100mg  Twice daily  , take with food, wear sunscreen .  Prednisone taper over next week.  Mucinex DM Twice daily  As needed cough and congestion  Stop smoking  follow up Dr. Lake Bells as planned this month and As needed   Please contact office for sooner follow up if symptoms do not improve or worsen or seek emergency care

## 2014-12-08 ENCOUNTER — Ambulatory Visit: Payer: Medicare Other | Admitting: Adult Health

## 2014-12-11 NOTE — Progress Notes (Signed)
Chart reviewed, I agree with Tammy Parrett's assessment above

## 2014-12-28 ENCOUNTER — Ambulatory Visit (INDEPENDENT_AMBULATORY_CARE_PROVIDER_SITE_OTHER): Payer: Medicare Other | Admitting: Pulmonary Disease

## 2014-12-28 ENCOUNTER — Encounter: Payer: Self-pay | Admitting: Pulmonary Disease

## 2014-12-28 VITALS — BP 126/66 | HR 102 | Ht 63.0 in | Wt 164.0 lb

## 2014-12-28 DIAGNOSIS — Z720 Tobacco use: Secondary | ICD-10-CM | POA: Diagnosis not present

## 2014-12-28 DIAGNOSIS — J449 Chronic obstructive pulmonary disease, unspecified: Secondary | ICD-10-CM

## 2014-12-28 DIAGNOSIS — Z23 Encounter for immunization: Secondary | ICD-10-CM | POA: Diagnosis not present

## 2014-12-28 DIAGNOSIS — F17210 Nicotine dependence, cigarettes, uncomplicated: Secondary | ICD-10-CM

## 2014-12-28 NOTE — Patient Instructions (Signed)
Stop smoking Keep taking the Symbicort and Spiriva as you're doing We will see you back in 6 months or sooner if needed

## 2014-12-28 NOTE — Assessment & Plan Note (Signed)
She was advised at length to quit smoking today.

## 2014-12-28 NOTE — Assessment & Plan Note (Signed)
Jenny Branch had an exacerbation of her severe COPD earlier this month. She has recovered. However, I explained to her today that continued exacerbations will cause worsening of her lung function. Obviously her ongoing tobacco use is contributing to her recurrent exacerbations.  Plan: Continue Symbicort and Spiriva Flu shot today Quit smoking Follow-up 6 months or sooner if needed

## 2014-12-28 NOTE — Progress Notes (Signed)
Subjective:    Patient ID: Jenny Branch, female    DOB: [birth year], 59 y.o.   MRN: [id number]   Synopsis: GOLD Grade D, 07/29/2011 simple spirometry> ratio 54%, FEV1 1.03 L (41% predicted), current smoker as of 2015  HPI Chief Complaint  Patient presents with  . Follow-up    post copd exacerbation.  pt c/o DOE, prod cough with white/clear mucus.     [name] had a bad flare up of hr COPD a month ago and she is now feeling better.  She has more trouble in the spring and fall with the allergies. Her breathing is now better but not great. No sinus symptoms at this time.   She felt like the chest congestion hung around for a long time after her  She is taking Symbicort now instead of Advair.  She continues to take the Spiriva. She has not had a flu shot yet.  Past Medical History  Diagnosis Date  . COPD (chronic obstructive pulmonary disease) ([place])   . Asthma   . Hypertension   . Depression   . PTSD (post-traumatic stress disorder)      Review of Systems  Constitutional: Negative for fever, chills and fatigue.  HENT: Negative for postnasal drip, rhinorrhea and sinus pressure.   Respiratory: Positive for cough. Negative for shortness of breath and wheezing.   Cardiovascular: Negative for chest pain, palpitations and leg swelling.       Objective:   Physical Exam Filed Vitals:   12/28/14 1511  BP: 126/66  Pulse: 102  Height: 5\' 3"  (1.6 m)  Weight: 164 lb (74.39 kg)  SpO2: 98%   RA  Gen: well appearing, no acute distress HEENT: NCAT,  EOMi, OP clear, PULM: CTA B, air movement normal today CV: RRR, no mgr, no JVD AB: BS+, soft, nontender,  Ext: warm, no edema, no clubbing, no cyanosis Derm: no rash or skin breakdown    Records from my partners reviewed where she was treated for COPD exacerbations on the last visit.   Assessment & Plan:   COPD GOLD III  [name] had an exacerbation of her severe COPD earlier this month. She has recovered. However, I explained to her  today that continued exacerbations will cause worsening of her lung function. Obviously her ongoing tobacco use is contributing to her recurrent exacerbations.  Plan: Continue Symbicort and Spiriva Flu shot today Quit smoking Follow-up 6 months or sooner if needed  Cigarette smoker She was advised at length to quit smoking today.    Updated Medication List Outpatient Encounter Prescriptions as of 12/28/2014  Medication Sig  . albuterol (PROVENTIL HFA;VENTOLIN HFA) 108 (90 BASE) MCG/ACT inhaler Inhale 2 puffs into the lungs every 6 (six) hours as needed for wheezing.  Marland Kitchen albuterol (PROVENTIL) (2.5 MG/3ML) 0.083% nebulizer solution Take 3 mLs (2.5 mg total) by nebulization every 4 (four) hours as needed.  . budesonide-formoterol (SYMBICORT) 160-4.5 MCG/ACT inhaler Take 2 puffs first thing in am and then another 2 puffs about 12 hours later.  . clonazePAM (KLONOPIN) 0.5 MG tablet Take 0.25 mg by mouth 3 (three) times daily as needed for anxiety.  Marland Kitchen dextromethorphan-guaiFENesin (MUCINEX DM) 30-600 MG per 12 hr tablet Take 1-2 tablets by mouth every 12 (twelve) hours. With water For congestion and cough   . fish oil-omega-3 fatty acids 1000 MG capsule Take 1 g by mouth daily.    Marland Kitchen losartan-hydrochlorothiazide (HYZAAR) 50-12.5 MG per tablet Take 1 tablet by mouth daily.  . mometasone (NASONEX) 50 MCG/ACT  nasal spray Place 2 sprays into the nose daily.  Marland Kitchen nystatin (MYCOSTATIN) 100000 UNIT/ML suspension Take 5 mLs (500,000 Units total) by mouth 4 (four) times daily.  Marland Kitchen PARoxetine (PAXIL) 10 MG tablet Take 1 tablet (10 mg total) by mouth every morning.  . predniSONE (DELTASONE) 5 MG tablet Take 1 tablet (5 mg total) by mouth daily with breakfast.  . tiotropium (SPIRIVA) 18 MCG inhalation capsule Place 1 capsule (18 mcg total) into inhaler and inhale daily.  . traZODone (DESYREL) 50 MG tablet Take 50 mg by mouth at bedtime as needed for sleep.  . [DISCONTINUED] doxycycline (VIBRA-TABS) 100 MG  tablet Take 1 tablet (100 mg total) by mouth 2 (two) times daily. (Patient not taking: Reported on 12/28/2014)  . [DISCONTINUED] predniSONE (DELTASONE) 10 MG tablet 4 tabs for 3 days, then 3 tabs for 3 days, 2 tabs for 3 days, then 1 tab for 3 days, then 5mg  daily (Patient not taking: Reported on 12/28/2014)   No facility-administered encounter medications on file as of 12/28/2014.

## 2015-01-01 ENCOUNTER — Other Ambulatory Visit: Payer: Self-pay | Admitting: Pulmonary Disease

## 2015-01-01 NOTE — Telephone Encounter (Signed)
No mention in 10/27 OV note if pt needs to continue on the prednisone Received refill request. Please advise Dr. Lake Bells thanks

## 2015-01-02 ENCOUNTER — Telehealth: Payer: Self-pay | Admitting: Pulmonary Disease

## 2015-01-02 NOTE — Telephone Encounter (Signed)
Patient calling to get refill on Prednisone.  Patient says that she takes Prednisone 5mg , unless having trouble breathing, at that point will take 10mg , she says that she usually gets prescription for 5 tablets daily in case she needs it.  Patient says that drug store told her it is a lot cheaper to get 90 day supply at a time, she said it is actually 1/2 the price to get 90 day supply.    Dr. Lake Bells, ok to send in 90 day supply of Prednisone?  Please advise.

## 2015-01-03 MED ORDER — PREDNISONE 5 MG PO TABS: 5.0000 mg | ORAL_TABLET | Freq: Every day | ORAL | Status: DC

## 2015-01-03 NOTE — Telephone Encounter (Signed)
Rx has been sent in. Pt is aware. Nothing further was needed. 

## 2015-01-03 NOTE — Telephone Encounter (Signed)
Fine by me 5mg  daily for 90 days, refill 1

## 2015-02-02 ENCOUNTER — Other Ambulatory Visit: Payer: Self-pay

## 2015-02-02 MED ORDER — ALBUTEROL SULFATE HFA 108 (90 BASE) MCG/ACT IN AERS: 2.0000 | INHALATION_SPRAY | Freq: Four times a day (QID) | RESPIRATORY_TRACT | Status: DC | PRN

## 2015-02-09 ENCOUNTER — Other Ambulatory Visit: Payer: Self-pay | Admitting: Internal Medicine

## 2015-02-09 ENCOUNTER — Other Ambulatory Visit (HOSPITAL_COMMUNITY)
Admission: RE | Admit: 2015-02-09 | Discharge: 2015-02-09 | Disposition: A | Discharge status: Home / Self Care | Payer: Medicare Other | Source: Ambulatory Visit | Attending: Internal Medicine | Admitting: Internal Medicine

## 2015-02-09 DIAGNOSIS — Z1151 Encounter for screening for human papillomavirus (HPV): Secondary | ICD-10-CM | POA: Diagnosis not present

## 2015-02-09 DIAGNOSIS — F17210 Nicotine dependence, cigarettes, uncomplicated: Secondary | ICD-10-CM | POA: Diagnosis not present

## 2015-02-09 DIAGNOSIS — Z01419 Encounter for gynecological examination (general) (routine) without abnormal findings: Secondary | ICD-10-CM | POA: Insufficient documentation

## 2015-02-09 DIAGNOSIS — Z1389 Encounter for screening for other disorder: Secondary | ICD-10-CM | POA: Diagnosis not present

## 2015-02-09 DIAGNOSIS — Z124 Encounter for screening for malignant neoplasm of cervix: Secondary | ICD-10-CM | POA: Diagnosis not present

## 2015-02-09 DIAGNOSIS — Z1211 Encounter for screening for malignant neoplasm of colon: Secondary | ICD-10-CM | POA: Diagnosis not present

## 2015-02-09 DIAGNOSIS — F418 Other specified anxiety disorders: Secondary | ICD-10-CM | POA: Diagnosis not present

## 2015-02-09 DIAGNOSIS — F5101 Primary insomnia: Secondary | ICD-10-CM | POA: Diagnosis not present

## 2015-02-09 DIAGNOSIS — I10 Essential (primary) hypertension: Secondary | ICD-10-CM | POA: Diagnosis not present

## 2015-02-09 DIAGNOSIS — J449 Chronic obstructive pulmonary disease, unspecified: Secondary | ICD-10-CM | POA: Diagnosis not present

## 2015-02-13 LAB — CYTOLOGY - PAP

## 2015-03-06 ENCOUNTER — Telehealth: Payer: Self-pay | Admitting: Pulmonary Disease

## 2015-03-06 MED ORDER — ALBUTEROL SULFATE HFA 108 (90 BASE) MCG/ACT IN AERS: 2.0000 | INHALATION_SPRAY | Freq: Four times a day (QID) | RESPIRATORY_TRACT | Status: DC | PRN

## 2015-03-06 MED ORDER — TIOTROPIUM BROMIDE MONOHYDRATE 18 MCG IN CAPS: 18.0000 ug | ORAL_CAPSULE | Freq: Every day | RESPIRATORY_TRACT | Status: DC

## 2015-03-06 MED ORDER — BUDESONIDE-FORMOTEROL FUMARATE 160-4.5 MCG/ACT IN AERO: INHALATION_SPRAY | RESPIRATORY_TRACT | Status: DC

## 2015-03-06 NOTE — Telephone Encounter (Signed)
Called spoke with pt. Aware refills will be sent in. Nothing further needed

## 2015-03-15 ENCOUNTER — Other Ambulatory Visit: Payer: Self-pay | Admitting: Pulmonary Disease

## 2015-03-28 ENCOUNTER — Other Ambulatory Visit: Payer: Self-pay

## 2015-03-28 MED ORDER — MOMETASONE FUROATE 50 MCG/ACT NA SUSP: 2.0000 | Freq: Every day | NASAL | Status: AC

## 2015-03-28 MED ORDER — MOMETASONE FUROATE 50 MCG/ACT NA SUSP: 2.0000 | Freq: Every day | NASAL | Status: DC

## 2015-05-08 DIAGNOSIS — J441 Chronic obstructive pulmonary disease with (acute) exacerbation: Secondary | ICD-10-CM | POA: Diagnosis not present

## 2015-05-09 ENCOUNTER — Inpatient Hospital Stay (HOSPITAL_COMMUNITY)
Admission: EM | Admit: 2015-05-09 | Discharge: 2015-05-25 | DRG: 207 | Disposition: A | Discharge status: Skilled Nursing Facility | Payer: Medicare Other | Attending: Acute Care | Admitting: Acute Care

## 2015-05-09 ENCOUNTER — Inpatient Hospital Stay (HOSPITAL_COMMUNITY): Payer: Medicare Other

## 2015-05-09 ENCOUNTER — Emergency Department (HOSPITAL_COMMUNITY): Payer: Medicare Other

## 2015-05-09 ENCOUNTER — Encounter (HOSPITAL_COMMUNITY): Payer: Self-pay | Admitting: *Deleted

## 2015-05-09 DIAGNOSIS — F1320 Sedative, hypnotic or anxiolytic dependence, uncomplicated: Secondary | ICD-10-CM | POA: Diagnosis present

## 2015-05-09 DIAGNOSIS — R05 Cough: Secondary | ICD-10-CM

## 2015-05-09 DIAGNOSIS — D7589 Other specified diseases of blood and blood-forming organs: Secondary | ICD-10-CM | POA: Diagnosis present

## 2015-05-09 DIAGNOSIS — J209 Acute bronchitis, unspecified: Secondary | ICD-10-CM | POA: Diagnosis present

## 2015-05-09 DIAGNOSIS — R000 Tachycardia, unspecified: Secondary | ICD-10-CM

## 2015-05-09 DIAGNOSIS — J45909 Unspecified asthma, uncomplicated: Secondary | ICD-10-CM | POA: Diagnosis present

## 2015-05-09 DIAGNOSIS — R0600 Dyspnea, unspecified: Secondary | ICD-10-CM | POA: Diagnosis present

## 2015-05-09 DIAGNOSIS — Z9289 Personal history of other medical treatment: Secondary | ICD-10-CM

## 2015-05-09 DIAGNOSIS — J9690 Respiratory failure, unspecified, unspecified whether with hypoxia or hypercapnia: Secondary | ICD-10-CM

## 2015-05-09 DIAGNOSIS — Z8249 Family history of ischemic heart disease and other diseases of the circulatory system: Secondary | ICD-10-CM

## 2015-05-09 DIAGNOSIS — E8770 Fluid overload, unspecified: Secondary | ICD-10-CM | POA: Diagnosis present

## 2015-05-09 DIAGNOSIS — J9602 Acute respiratory failure with hypercapnia: Secondary | ICD-10-CM | POA: Diagnosis not present

## 2015-05-09 DIAGNOSIS — F329 Major depressive disorder, single episode, unspecified: Secondary | ICD-10-CM | POA: Diagnosis present

## 2015-05-09 DIAGNOSIS — B9620 Unspecified Escherichia coli [E. coli] as the cause of diseases classified elsewhere: Secondary | ICD-10-CM | POA: Diagnosis present

## 2015-05-09 DIAGNOSIS — N179 Acute kidney failure, unspecified: Secondary | ICD-10-CM | POA: Diagnosis not present

## 2015-05-09 DIAGNOSIS — E860 Dehydration: Secondary | ICD-10-CM | POA: Diagnosis present

## 2015-05-09 DIAGNOSIS — J9600 Acute respiratory failure, unspecified whether with hypoxia or hypercapnia: Secondary | ICD-10-CM | POA: Insufficient documentation

## 2015-05-09 DIAGNOSIS — F4310 Post-traumatic stress disorder, unspecified: Secondary | ICD-10-CM | POA: Diagnosis present

## 2015-05-09 DIAGNOSIS — E872 Acidosis: Secondary | ICD-10-CM | POA: Diagnosis present

## 2015-05-09 DIAGNOSIS — I10 Essential (primary) hypertension: Secondary | ICD-10-CM | POA: Diagnosis present

## 2015-05-09 DIAGNOSIS — J441 Chronic obstructive pulmonary disease with (acute) exacerbation: Secondary | ICD-10-CM

## 2015-05-09 DIAGNOSIS — R2689 Other abnormalities of gait and mobility: Secondary | ICD-10-CM | POA: Diagnosis not present

## 2015-05-09 DIAGNOSIS — R739 Hyperglycemia, unspecified: Secondary | ICD-10-CM | POA: Diagnosis present

## 2015-05-09 DIAGNOSIS — R069 Unspecified abnormalities of breathing: Secondary | ICD-10-CM | POA: Diagnosis not present

## 2015-05-09 DIAGNOSIS — R0603 Acute respiratory distress: Secondary | ICD-10-CM

## 2015-05-09 DIAGNOSIS — R0902 Hypoxemia: Secondary | ICD-10-CM

## 2015-05-09 DIAGNOSIS — J9622 Acute and chronic respiratory failure with hypercapnia: Secondary | ICD-10-CM | POA: Diagnosis present

## 2015-05-09 DIAGNOSIS — R319 Hematuria, unspecified: Secondary | ICD-10-CM | POA: Diagnosis present

## 2015-05-09 DIAGNOSIS — E876 Hypokalemia: Secondary | ICD-10-CM | POA: Diagnosis not present

## 2015-05-09 DIAGNOSIS — E870 Hyperosmolality and hypernatremia: Secondary | ICD-10-CM | POA: Diagnosis not present

## 2015-05-09 DIAGNOSIS — F17210 Nicotine dependence, cigarettes, uncomplicated: Secondary | ICD-10-CM | POA: Diagnosis present

## 2015-05-09 DIAGNOSIS — I471 Supraventricular tachycardia: Secondary | ICD-10-CM | POA: Diagnosis present

## 2015-05-09 DIAGNOSIS — K567 Ileus, unspecified: Secondary | ICD-10-CM | POA: Diagnosis not present

## 2015-05-09 DIAGNOSIS — Z6834 Body mass index (BMI) 34.0-34.9, adult: Secondary | ICD-10-CM | POA: Diagnosis not present

## 2015-05-09 DIAGNOSIS — D72829 Elevated white blood cell count, unspecified: Secondary | ICD-10-CM

## 2015-05-09 DIAGNOSIS — G9340 Encephalopathy, unspecified: Secondary | ICD-10-CM | POA: Diagnosis not present

## 2015-05-09 DIAGNOSIS — F10239 Alcohol dependence with withdrawal, unspecified: Secondary | ICD-10-CM | POA: Diagnosis present

## 2015-05-09 DIAGNOSIS — J440 Chronic obstructive pulmonary disease with acute lower respiratory infection: Secondary | ICD-10-CM | POA: Diagnosis present

## 2015-05-09 DIAGNOSIS — J449 Chronic obstructive pulmonary disease, unspecified: Secondary | ICD-10-CM | POA: Diagnosis not present

## 2015-05-09 DIAGNOSIS — J9621 Acute and chronic respiratory failure with hypoxia: Secondary | ICD-10-CM | POA: Diagnosis not present

## 2015-05-09 DIAGNOSIS — R0602 Shortness of breath: Secondary | ICD-10-CM

## 2015-05-09 DIAGNOSIS — R509 Fever, unspecified: Secondary | ICD-10-CM | POA: Diagnosis not present

## 2015-05-09 DIAGNOSIS — R140 Abdominal distension (gaseous): Secondary | ICD-10-CM

## 2015-05-09 DIAGNOSIS — R059 Cough, unspecified: Secondary | ICD-10-CM

## 2015-05-09 DIAGNOSIS — Z658 Other specified problems related to psychosocial circumstances: Secondary | ICD-10-CM | POA: Diagnosis not present

## 2015-05-09 DIAGNOSIS — Z4659 Encounter for fitting and adjustment of other gastrointestinal appliance and device: Secondary | ICD-10-CM

## 2015-05-09 DIAGNOSIS — E871 Hypo-osmolality and hyponatremia: Secondary | ICD-10-CM | POA: Diagnosis present

## 2015-05-09 DIAGNOSIS — R2681 Unsteadiness on feet: Secondary | ICD-10-CM | POA: Diagnosis not present

## 2015-05-09 DIAGNOSIS — J9601 Acute respiratory failure with hypoxia: Secondary | ICD-10-CM

## 2015-05-09 DIAGNOSIS — N390 Urinary tract infection, site not specified: Secondary | ICD-10-CM | POA: Diagnosis present

## 2015-05-09 DIAGNOSIS — Z4682 Encounter for fitting and adjustment of non-vascular catheter: Secondary | ICD-10-CM | POA: Diagnosis not present

## 2015-05-09 DIAGNOSIS — I517 Cardiomegaly: Secondary | ICD-10-CM | POA: Diagnosis not present

## 2015-05-09 DIAGNOSIS — Z720 Tobacco use: Secondary | ICD-10-CM | POA: Diagnosis not present

## 2015-05-09 DIAGNOSIS — Z87898 Personal history of other specified conditions: Secondary | ICD-10-CM

## 2015-05-09 DIAGNOSIS — Z978 Presence of other specified devices: Secondary | ICD-10-CM

## 2015-05-09 DIAGNOSIS — J439 Emphysema, unspecified: Secondary | ICD-10-CM | POA: Diagnosis not present

## 2015-05-09 DIAGNOSIS — M6281 Muscle weakness (generalized): Secondary | ICD-10-CM | POA: Diagnosis not present

## 2015-05-09 DIAGNOSIS — J9811 Atelectasis: Secondary | ICD-10-CM | POA: Diagnosis not present

## 2015-05-09 LAB — BASIC METABOLIC PANEL
ANION GAP: 16 — AB (ref 5–15)
BUN: 13 mg/dL (ref 6–20)
CO2: 29 mmol/L (ref 22–32)
Calcium: 9.3 mg/dL (ref 8.9–10.3)
Chloride: 84 mmol/L — ABNORMAL LOW (ref 101–111)
Creatinine, Ser: 0.77 mg/dL (ref 0.44–1.00)
GFR calc Af Amer: >60 mL/min (ref >60)
GLUCOSE: 149 mg/dL — AB (ref 65–99)
POTASSIUM: 3 mmol/L — AB (ref 3.5–5.1)
Sodium: 129 mmol/L — ABNORMAL LOW (ref 135–145)

## 2015-05-09 LAB — BLOOD GAS, ARTERIAL
ACID-BASE EXCESS: 3.5 mmol/L — AB (ref 0.0–2.0)
Acid-Base Excess: 1.8 mmol/L (ref 0.0–2.0)
BICARBONATE: 29.2 meq/L — AB (ref 20.0–24.0)
BICARBONATE: 31.4 meq/L — AB (ref 20.0–24.0)
DELIVERY SYSTEMS: POSITIVE
Delivery systems: POSITIVE
Drawn by: 331471
Drawn by: 257701
EXPIRATORY PAP: 4
EXPIRATORY PAP: 5
FIO2: 0.4
FIO2: 0.3
INSPIRATORY PAP: 12
Inspiratory PAP: 8
Mode: POSITIVE
O2 SAT: 99.1 %
O2 Saturation: 94.1 %
PATIENT TEMPERATURE: 98.6
PH ART: 7.317 — AB (ref 7.350–7.450)
PO2 ART: 267 mmHg — AB (ref 80.0–100.0)
Patient temperature: 98.6
TCO2: 25.1 mmol/L (ref 0–100)
TCO2: 27.6 mmol/L (ref 0–100)
pCO2 arterial: 58.2 mmHg (ref 35.0–45.0)
pCO2 arterial: 63.2 mmHg (ref 35.0–45.0)
pH, Arterial: 7.322 — ABNORMAL LOW (ref 7.350–7.450)
pO2, Arterial: 82.4 mmHg (ref 80.0–100.0)

## 2015-05-09 LAB — INFLUENZA PANEL BY PCR (TYPE A & B)
H1N1 flu by pcr: NOT DETECTED
INFLAPCR: NEGATIVE
Influenza B By PCR: NEGATIVE

## 2015-05-09 LAB — CBC WITH DIFFERENTIAL/PLATELET
BASOS ABS: 0 10*3/uL (ref 0.0–0.1)
Basophils Relative: 0 %
Eosinophils Absolute: 0 10*3/uL (ref 0.0–0.7)
Eosinophils Relative: 0 %
HEMATOCRIT: 49.7 % — AB (ref 36.0–46.0)
HEMOGLOBIN: 17.2 g/dL — AB (ref 12.0–15.0)
LYMPHS PCT: 3 %
Lymphs Abs: 0.5 10*3/uL — ABNORMAL LOW (ref 0.7–4.0)
MCH: 36.1 pg — ABNORMAL HIGH (ref 26.0–34.0)
MCHC: 34.6 g/dL (ref 30.0–36.0)
MCV: 104.2 fL — AB (ref 78.0–100.0)
Monocytes Absolute: 0 10*3/uL — ABNORMAL LOW (ref 0.1–1.0)
Monocytes Relative: 0 %
NEUTROS ABS: 16.1 10*3/uL — AB (ref 1.7–7.7)
Neutrophils Relative %: 97 %
Platelets: 329 10*3/uL (ref 150–400)
RBC: 4.77 MIL/uL (ref 3.87–5.11)
RDW: 14.4 % (ref 11.5–15.5)
WBC: 16.6 10*3/uL — AB (ref 4.0–10.5)

## 2015-05-09 LAB — MRSA PCR SCREENING: MRSA by PCR: NEGATIVE

## 2015-05-09 LAB — I-STAT TROPONIN, ED: Troponin i, poc: 0 ng/mL (ref 0.00–0.08)

## 2015-05-09 LAB — I-STAT CG4 LACTIC ACID, ED: Lactic Acid, Venous: 2.54 mmol/L (ref 0.5–2.0)

## 2015-05-09 MED ORDER — ALBUTEROL SULFATE (2.5 MG/3ML) 0.083% IN NEBU: 2.5000 mg | INHALATION_SOLUTION | Freq: Four times a day (QID) | RESPIRATORY_TRACT | Status: DC

## 2015-05-09 MED ORDER — ALBUTEROL SULFATE (2.5 MG/3ML) 0.083% IN NEBU: 2.5000 mg | INHALATION_SOLUTION | RESPIRATORY_TRACT | Status: DC | PRN

## 2015-05-09 MED ORDER — LORAZEPAM 2 MG/ML IJ SOLN
2.0000 mg | INTRAMUSCULAR | Status: DC | PRN
  Administered 2015-05-09: 2 mg via INTRAVENOUS
  Filled 2015-05-09: qty 1

## 2015-05-09 MED ORDER — LACTATED RINGERS IV BOLUS (SEPSIS): 1000.0000 mL | Freq: Once | INTRAVENOUS | Status: DC

## 2015-05-09 MED ORDER — FENTANYL CITRATE (PF) 100 MCG/2ML IJ SOLN
50.0000 ug | Freq: Once | INTRAMUSCULAR | Status: DC
  Filled 2015-05-09: qty 2

## 2015-05-09 MED ORDER — MIDAZOLAM HCL 2 MG/2ML IJ SOLN
2.0000 mg | INTRAMUSCULAR | Status: DC | PRN
  Administered 2015-05-12 – 2015-05-15 (×24): 2 mg via INTRAVENOUS
  Filled 2015-05-09 (×24): qty 2

## 2015-05-09 MED ORDER — LOSARTAN POTASSIUM 50 MG PO TABS
50.0000 mg | ORAL_TABLET | Freq: Every day | ORAL | Status: DC
  Filled 2015-05-09 (×3): qty 1

## 2015-05-09 MED ORDER — ANTISEPTIC ORAL RINSE SOLUTION (CORINZ)
7.0000 mL | Freq: Four times a day (QID) | OROMUCOSAL | Status: DC
  Administered 2015-05-10 – 2015-05-17 (×29): 7 mL via OROMUCOSAL

## 2015-05-09 MED ORDER — POTASSIUM CHLORIDE CRYS ER 20 MEQ PO TBCR
40.0000 meq | EXTENDED_RELEASE_TABLET | Freq: Once | ORAL | Status: AC
  Administered 2015-05-09: 40 meq via ORAL
  Filled 2015-05-09: qty 2

## 2015-05-09 MED ORDER — METHYLPREDNISOLONE SODIUM SUCC 125 MG IJ SOLR
60.0000 mg | Freq: Two times a day (BID) | INTRAMUSCULAR | Status: DC
  Administered 2015-05-09 – 2015-05-14 (×10): 60 mg via INTRAVENOUS
  Filled 2015-05-09 (×10): qty 2

## 2015-05-09 MED ORDER — SODIUM CHLORIDE 0.9 % IV SOLN
INTRAVENOUS | Status: DC
  Administered 2015-05-09 – 2015-05-11 (×2): via INTRAVENOUS
  Administered 2015-05-11: 1000 mL via INTRAVENOUS
  Administered 2015-05-12 – 2015-05-14 (×3): via INTRAVENOUS
  Administered 2015-05-15: 100 mL/h via INTRAVENOUS
  Administered 2015-05-16 – 2015-05-18 (×3): via INTRAVENOUS
  Administered 2015-05-19 – 2015-05-20 (×3): 100 mL/h via INTRAVENOUS
  Administered 2015-05-21: 06:00:00 via INTRAVENOUS

## 2015-05-09 MED ORDER — CHLORHEXIDINE GLUCONATE 0.12% ORAL RINSE (MEDLINE KIT)
15.0000 mL | Freq: Two times a day (BID) | OROMUCOSAL | Status: DC
  Administered 2015-05-09 – 2015-05-17 (×15): 15 mL via OROMUCOSAL

## 2015-05-09 MED ORDER — FOLIC ACID 5 MG/ML IJ SOLN
1.0000 mg | Freq: Every day | INTRAMUSCULAR | Status: DC
  Administered 2015-05-09: 1 mg via INTRAVENOUS
  Filled 2015-05-09 (×3): qty 0.2

## 2015-05-09 MED ORDER — ALBUTEROL (5 MG/ML) CONTINUOUS INHALATION SOLN
10.0000 mg/h | INHALATION_SOLUTION | RESPIRATORY_TRACT | Status: DC
  Administered 2015-05-09: 10 mg/h via RESPIRATORY_TRACT
  Filled 2015-05-09: qty 20

## 2015-05-09 MED ORDER — PROPOFOL 1000 MG/100ML IV EMUL
0.0000 ug/kg/min | INTRAVENOUS | Status: DC
  Administered 2015-05-09: 25 ug/kg/min via INTRAVENOUS
  Administered 2015-05-10 (×2): 30 ug/kg/min via INTRAVENOUS
  Filled 2015-05-09 (×3): qty 100

## 2015-05-09 MED ORDER — MAGNESIUM SULFATE 2 GM/50ML IV SOLN
2.0000 g | Freq: Once | INTRAVENOUS | Status: DC
  Filled 2015-05-09: qty 50

## 2015-05-09 MED ORDER — IPRATROPIUM BROMIDE 0.02 % IN SOLN
0.5000 mg | Freq: Once | RESPIRATORY_TRACT | Status: AC
  Administered 2015-05-09: 0.5 mg via RESPIRATORY_TRACT
  Filled 2015-05-09: qty 2.5

## 2015-05-09 MED ORDER — MIDAZOLAM HCL 2 MG/2ML IJ SOLN
2.0000 mg | INTRAMUSCULAR | Status: AC | PRN
  Administered 2015-05-11 – 2015-05-12 (×3): 2 mg via INTRAVENOUS
  Filled 2015-05-09 (×6): qty 2

## 2015-05-09 MED ORDER — MOMETASONE FURO-FORMOTEROL FUM 200-5 MCG/ACT IN AERO
2.0000 | INHALATION_SPRAY | Freq: Two times a day (BID) | RESPIRATORY_TRACT | Status: DC
  Filled 2015-05-09: qty 8.8

## 2015-05-09 MED ORDER — PANTOPRAZOLE SODIUM 40 MG IV SOLR
40.0000 mg | INTRAVENOUS | Status: DC
  Administered 2015-05-09 – 2015-05-20 (×12): 40 mg via INTRAVENOUS
  Filled 2015-05-09 (×12): qty 40

## 2015-05-09 MED ORDER — ENOXAPARIN SODIUM 40 MG/0.4ML ~~LOC~~ SOLN: 40.0000 mg | SUBCUTANEOUS | Status: DC

## 2015-05-09 MED ORDER — IPRATROPIUM BROMIDE 0.02 % IN SOLN: 0.5000 mg | Freq: Four times a day (QID) | RESPIRATORY_TRACT | Status: DC

## 2015-05-09 MED ORDER — TRAZODONE HCL 50 MG PO TABS
50.0000 mg | ORAL_TABLET | Freq: Every evening | ORAL | Status: DC | PRN
Start: 2015-05-09 — End: 2015-05-14
  Administered 2015-05-13: 50 mg via ORAL
  Filled 2015-05-09: qty 1

## 2015-05-09 MED ORDER — LORAZEPAM 2 MG/ML IJ SOLN
0.5000 mg | Freq: Once | INTRAMUSCULAR | Status: AC
  Administered 2015-05-09: 0.5 mg via INTRAVENOUS
  Filled 2015-05-09: qty 1

## 2015-05-09 MED ORDER — HYDROCHLOROTHIAZIDE 12.5 MG PO CAPS
12.5000 mg | ORAL_CAPSULE | Freq: Every day | ORAL | Status: DC
  Administered 2015-05-10 – 2015-05-11 (×2): 12.5 mg via ORAL
  Filled 2015-05-09 (×2): qty 1

## 2015-05-09 MED ORDER — NYSTATIN 100000 UNIT/ML MT SUSP: 5.0000 mL | Freq: Four times a day (QID) | OROMUCOSAL | Status: DC

## 2015-05-09 MED ORDER — HEPARIN SODIUM (PORCINE) 5000 UNIT/ML IJ SOLN
5000.0000 [IU] | Freq: Three times a day (TID) | INTRAMUSCULAR | Status: DC
  Administered 2015-05-09 – 2015-05-12 (×9): 5000 [IU] via SUBCUTANEOUS
  Filled 2015-05-09 (×9): qty 1

## 2015-05-09 MED ORDER — THIAMINE HCL 100 MG/ML IJ SOLN
100.0000 mg | Freq: Every day | INTRAMUSCULAR | Status: DC
  Administered 2015-05-09: 100 mg via INTRAVENOUS
  Filled 2015-05-09: qty 2

## 2015-05-09 MED ORDER — DOXYCYCLINE HYCLATE 100 MG PO TABS
100.0000 mg | ORAL_TABLET | Freq: Two times a day (BID) | ORAL | Status: DC
  Administered 2015-05-10 (×2): 100 mg via ORAL
  Filled 2015-05-09 (×2): qty 1

## 2015-05-09 MED ORDER — TIOTROPIUM BROMIDE MONOHYDRATE 18 MCG IN CAPS
18.0000 ug | ORAL_CAPSULE | Freq: Every day | RESPIRATORY_TRACT | Status: DC
Start: 2015-05-09 — End: 2015-05-09

## 2015-05-09 MED ORDER — GUAIFENESIN 100 MG/5ML PO SOLN
10.0000 mL | Freq: Four times a day (QID) | ORAL | Status: DC
  Administered 2015-05-10 – 2015-05-14 (×14): 200 mg via ORAL
  Filled 2015-05-09 (×15): qty 10

## 2015-05-09 MED ORDER — DM-GUAIFENESIN ER 30-600 MG PO TB12: 1.0000 | ORAL_TABLET | Freq: Two times a day (BID) | ORAL | Status: DC

## 2015-05-09 MED ORDER — CLONAZEPAM 0.5 MG PO TABS
0.2500 mg | ORAL_TABLET | Freq: Three times a day (TID) | ORAL | Status: DC | PRN
  Administered 2015-05-13: 0.25 mg via ORAL
  Filled 2015-05-09 (×2): qty 1

## 2015-05-09 MED ORDER — MIDAZOLAM HCL 2 MG/2ML IJ SOLN
INTRAMUSCULAR | Status: AC
  Administered 2015-05-09: 4 mg
  Filled 2015-05-09: qty 4

## 2015-05-09 MED ORDER — FENTANYL CITRATE (PF) 100 MCG/2ML IJ SOLN
INTRAMUSCULAR | Status: AC
  Administered 2015-05-09: 100 ug
  Filled 2015-05-09: qty 4

## 2015-05-09 MED ORDER — POTASSIUM CHLORIDE CRYS ER 20 MEQ PO TBCR: 40.0000 meq | EXTENDED_RELEASE_TABLET | Freq: Once | ORAL | Status: DC

## 2015-05-09 MED ORDER — ONDANSETRON HCL 4 MG/2ML IJ SOLN: 4.0000 mg | Freq: Four times a day (QID) | INTRAMUSCULAR | Status: DC | PRN

## 2015-05-09 MED ORDER — LOSARTAN POTASSIUM-HCTZ 50-12.5 MG PO TABS: 1.0000 | ORAL_TABLET | Freq: Every day | ORAL | Status: DC

## 2015-05-09 MED ORDER — PAROXETINE HCL 20 MG PO TABS
10.0000 mg | ORAL_TABLET | Freq: Every day | ORAL | Status: DC
  Administered 2015-05-10 – 2015-05-13 (×4): 10 mg
  Filled 2015-05-09 (×5): qty 1

## 2015-05-09 MED ORDER — FENTANYL BOLUS VIA INFUSION
50.0000 ug | INTRAVENOUS | Status: DC | PRN
  Administered 2015-05-12 (×7): 50 ug via INTRAVENOUS
  Filled 2015-05-09: qty 50

## 2015-05-09 MED ORDER — ONDANSETRON HCL 4 MG PO TABS: 4.0000 mg | ORAL_TABLET | Freq: Four times a day (QID) | ORAL | Status: DC | PRN

## 2015-05-09 MED ORDER — GUAIFENESIN ER 600 MG PO TB12: 600.0000 mg | ORAL_TABLET | Freq: Two times a day (BID) | ORAL | Status: DC

## 2015-05-09 MED ORDER — SODIUM CHLORIDE 0.9 % IV SOLN
25.0000 ug/h | INTRAVENOUS | Status: DC
  Administered 2015-05-09: 50 ug/h via INTRAVENOUS
  Administered 2015-05-10: 300 ug/h via INTRAVENOUS
  Administered 2015-05-11: 150 ug/h via INTRAVENOUS
  Administered 2015-05-11: 100 ug/h via INTRAVENOUS
  Administered 2015-05-12: 250 ug/h via INTRAVENOUS
  Administered 2015-05-12: 400 ug/h via INTRAVENOUS
  Administered 2015-05-12: 300 ug/h via INTRAVENOUS
  Administered 2015-05-12 – 2015-05-13 (×2): 250 ug/h via INTRAVENOUS
  Administered 2015-05-13 – 2015-05-14 (×2): 350 ug/h via INTRAVENOUS
  Administered 2015-05-14: 300 ug/h via INTRAVENOUS
  Administered 2015-05-14: 250 ug/h via INTRAVENOUS
  Administered 2015-05-14 – 2015-05-15 (×2): 50 ug/h via INTRAVENOUS
  Administered 2015-05-15: 200 ug/h via INTRAVENOUS
  Filled 2015-05-09 (×14): qty 50

## 2015-05-09 MED ORDER — PAROXETINE HCL 20 MG PO TABS: 10.0000 mg | ORAL_TABLET | Freq: Every day | ORAL | Status: DC

## 2015-05-09 MED ORDER — IPRATROPIUM-ALBUTEROL 0.5-2.5 (3) MG/3ML IN SOLN
3.0000 mL | Freq: Four times a day (QID) | RESPIRATORY_TRACT | Status: DC
  Administered 2015-05-09 – 2015-05-24 (×54): 3 mL via RESPIRATORY_TRACT
  Filled 2015-05-09 (×59): qty 3

## 2015-05-09 NOTE — Progress Notes (Addendum)
ED Cm consulted by EDPA/NP about pt ED visit and her voiced concerned about missing an appt today with her pcp CM called pcp office [phone] to speak with staff about pt missing her appt Cm informed pt appt not with her pcp but a specialist in the [place] [facility] office, Dr Earle Gell, [facility] Cm transferred to [facility], his secretary's number  Cm left a voice message for Anderson Malta explaining that the pt arrived at the [facility] ED after 1000 for SOB, cough, is being evaluated and would probably miss her appt Request appt be cancelled per pt request, Inquired about office policy for cancellation. Cm left Cm mobile number if Anderson Malta needed to follow up on pt status

## 2015-05-09 NOTE — Procedures (Signed)
Intubation Procedure Note Jenny Branch [id number] 12-Jul-1955  Procedure: Intubation Indications: Airway protection and maintenance  Procedure Details Consent: Risks of procedure as well as the alternatives and risks of each were explained to the (patient/caregiver).  Consent for procedure obtained. Time Out: Verified patient identification, verified procedure, site/side was marked, verified correct patient position, special equipment/implants available, medications/allergies/relevent history reviewed, required imaging and test results available.  Performed  Jenny Branch and 3   Evaluation Hemodynamic Status: BP stable throughout; O2 sats: stable throughout Patient's Current Condition: stable Complications: No apparent complications Patient did tolerate procedure well. Chest X-ray ordered to verify placement.  CXR: pending.   Jenny Branch 05/09/2015

## 2015-05-09 NOTE — ED Notes (Signed)
Bed: RESB Expected date:  Expected time:  Means of arrival:  Comments: 53F/COPD exacerbation

## 2015-05-09 NOTE — Progress Notes (Signed)
eLink Physician-Brief Progress Note Patient Name: Jenny Branch DOB: Jul 13, 1955 MRN: [id number]   Date of Service  05/09/2015  HPI/Events of Note  Camera check on patient with hospitalist at bedside. patient received 2 mg IV Ativan at 4:30 PM approximately.Nurse reports patient was significant agitated. Now minimally responsive. BiPAP settings unchanged. Hemodynamics and saturation unchanged. Patient's respiratory rate unchanged.Unable to reverse benzodiazepine given chronic use and potential for seizure.  eICU Interventions  1. Stat ABG pending 2. i have contacted Dr. Kimber Relic for bedside assessment and possible intubation for airway protection     Intervention Category Major Interventions: Respiratory failure - evaluation and management  Tera Partridge 05/09/2015, 5:58 PM

## 2015-05-09 NOTE — Consult Note (Signed)
PULMONARY / CRITICAL CARE MEDICINE   Name: Jenny Branch MRN: [id number] DOB: 1955/11/21    ADMISSION DATE:  05/09/2015 CONSULTATION DATE:  05/09/15  REFERRING MD:  Dr. Doyle Askew   CHIEF COMPLAINT:  Respiratory Distress / AECOPD   HISTORY OF PRESENT ILLNESS:   60 y/o F, current smoker (1+ ppd since age 50), ETOH abuse with PMH HTN, PTSD, Asthma / COPD (not defined) who presented to [facility] ER via EMS on 3/8 with complaints of SOB.  The patient is on BiPAP and answers yes/no questions.  Sister at bedside assists with providing information.  Family reports she began feeling bad approximately one week prior to admission.  She was seen by her PCP on 3/3 and prescribed doxycycline and prednisone taper which she began taking on 3/7 (delayed?).  She reports progressive cough with white sputum production, SOB, wheezing and chest tightness.  Denies known fevers but reports feeling hot / cold.  The patient attempted mucinex without relief.  Symptoms worsened on am of 3/8 and she activated EMS.  The patient reports she has never been intubated.  She also states she drinks 2-3 drinks per day and does shake if she does not drink.    EMS treated the patient with solu-medrol 125mg , magnesium 2gm and 10 mg albuterol without relief.  Initial work up in the ER concerning for possible COPD exacerbation.  The patient had significant respiratory distress and was placed on BiPAP.  ABG 7.322 / 58 / 267 / 29 on BiPAP.  Initial labs - Na 129, K 3.0, Cl 84, sr cr 0.77, glucose 149, troponin 0.00, WBC 16.6, Hgb 17.2, MCV 104 and platelets 329.  She continued to have respiratory increased respiratory effort and PCCM consulted for evaluation.    PAST MEDICAL HISTORY :  She  has a past medical history of COPD (chronic obstructive pulmonary disease) ([place]); Asthma; Hypertension; Depression; and PTSD (post-traumatic stress disorder).  PAST SURGICAL HISTORY: She  has past surgical history that includes Tonsillectomy.  Allergies   Allergen Reactions  . Penicillins Itching and Rash    No current facility-administered medications on file prior to encounter.   Current Outpatient Prescriptions on File Prior to Encounter  Medication Sig  . albuterol (PROVENTIL HFA;VENTOLIN HFA) 108 (90 Base) MCG/ACT inhaler Inhale 2 puffs into the lungs every 6 (six) hours as needed for wheezing.  Marland Kitchen albuterol (PROVENTIL) (2.5 MG/3ML) 0.083% nebulizer solution Take 3 mLs (2.5 mg total) by nebulization every 4 (four) hours as needed.  . budesonide-formoterol (SYMBICORT) 160-4.5 MCG/ACT inhaler Take 2 puffs first thing in am and then another 2 puffs about 12 hours later.  Marland Kitchen losartan-hydrochlorothiazide (HYZAAR) 50-12.5 MG per tablet Take 1 tablet by mouth daily.  . mometasone (NASONEX) 50 MCG/ACT nasal spray Place 2 sprays into the nose daily.  . predniSONE (DELTASONE) 5 MG tablet TAKE 1 TABLET BY MOUTH DAILY WITH BREAKFAST  . tiotropium (SPIRIVA) 18 MCG inhalation capsule Place 1 capsule (18 mcg total) into inhaler and inhale daily.  . clonazePAM (KLONOPIN) 0.5 MG tablet Take 0.25 mg by mouth 3 (three) times daily as needed for anxiety.  Marland Kitchen dextromethorphan-guaiFENesin (MUCINEX DM) 30-600 MG per 12 hr tablet Take 1-2 tablets by mouth every 12 (twelve) hours. With water For congestion and cough   . fish oil-omega-3 fatty acids 1000 MG capsule Take 1 g by mouth daily.    Marland Kitchen nystatin (MYCOSTATIN) 100000 UNIT/ML suspension Take 5 mLs (500,000 Units total) by mouth 4 (four) times daily.  . traZODone (DESYREL) 50  MG tablet Take 50 mg by mouth at bedtime as needed for sleep.    FAMILY HISTORY:  Her indicated that her mother is alive. She indicated that her father is alive. She indicated that her sister is alive.   SOCIAL HISTORY: She  reports that she has been smoking Cigarettes.  She has a 49.5 pack-year smoking history. She has never used smokeless tobacco. She reports that she drinks about 8.4 oz of alcohol per week. She reports that she does  not use illicit drugs.  REVIEW OF SYSTEMS:   Unable to complete as patient is on BiPAP  SUBJECTIVE:    VITAL SIGNS: BP 155/128 mmHg  Pulse 121  Temp(Src) 98.3 F (36.8 C) (Oral)  Resp 33  SpO2 93%  HEMODYNAMICS:    VENTILATOR SETTINGS:    INTAKE / OUTPUT:    PHYSICAL EXAMINATION: General:  Chronically ill appearing female on BiPAP Neuro:  Awake, alert, answers yes/no questions, follows commands  HEENT:  MM pink/moist, no JVD, skin flushed / ruddy, telangiectasias on face Cardiovascular:  s1s2 distant, tachy  Lungs:  Shallow, abdominal accessory muscle use, able to speak short sentences, wheezing bilaterally  Abdomen:  Obese/soft, bsx4 active  Musculoskeletal:  No acute deformities  Skin:  Warm/dry, no edema, rashes or lesions  LABS:  BMET  Recent Labs Lab 05/09/15 1116  NA 129*  K 3.0*  CL 84*  CO2 29  BUN 13  CREATININE 0.77  GLUCOSE 149*    Electrolytes  Recent Labs Lab 05/09/15 1116  CALCIUM 9.3    CBC  Recent Labs Lab 05/09/15 1116  WBC 16.6*  HGB 17.2*  HCT 49.7*  PLT 329    Coag's No results for input(s): APTT, INR in the last 168 hours.  Sepsis Markers  Recent Labs Lab 05/09/15 1316  LATICACIDVEN 2.54*    ABG  Recent Labs Lab 05/09/15 1135  PHART 7.322*  PCO2ART 58.2*  PO2ART 267*    Liver Enzymes No results for input(s): AST, ALT, ALKPHOS, BILITOT, ALBUMIN in the last 168 hours.  Cardiac Enzymes No results for input(s): TROPONINI, PROBNP in the last 168 hours.  Glucose No results for input(s): GLUCAP in the last 168 hours.  Imaging Dg Chest Port 1 View  05/09/2015  CLINICAL DATA:  Shortness of breath and cough over the past few days EXAM: PORTABLE CHEST 1 VIEW COMPARISON:  07/28/2013 FINDINGS: Cardiac shadow is stable. The lungs are well aerated bilaterally. No focal infiltrate or sizable effusion is seen. No acute bony abnormality is noted. IMPRESSION: No active disease. Electronically Signed   By: Inez Catalina M.D.   On: 05/09/2015 11:01     STUDIES:    CULTURES: Flu PCR 3/8 >>   ANTIBIOTICS: Doxycycline 3/8 >>   SIGNIFICANT EVENTS: 3/08  Admit with COPD exacerbation   LINES/TUBES:   DISCUSSION: 60 y/o F, smoker, ETOH use admitted on 3/8 with concern for AECOPD.  R/o flu.    ASSESSMENT / PLAN:  PULMONARY A: Acute Hypercarbic Respiratory Failure - in setting of obstructive lung disease with exacerbation  COPD - last spirometry in 2015 showed very severe obstruction  Tobacco Abuse  P:   Now BiPAP Duoneb Q6 with Q3 PRN albuterol  Intermittent CXR  Concerned she may require mechanical ventilation, hopeful to avoid   Droplet precautions, r/o flu Assess UDS   CARDIOVASCULAR A:  Tachycardia - in setting of COPD exacerbation, beta agonists P:  SDU monitoring   RENAL A:   Hyponatremia  Hypokalemia  Anion  Gap Acidosis - lactic acid  P:   Trend BMP / UOP  NS @ 75 ml/hr Replace K   GASTROINTESTINAL A:   Morbid Obesity  P:   NPO until respiratory status improves Add PPI while on steroids   HEMATOLOGIC A:   Macrocytosis  P:  Trend CBC  Heparin SQ for DVT prophylaxis   INFECTIOUS A:   AECOPD  R/o Flu - sick greater than one week P:   No role Tamilflu given duration of symptoms Monitor fever curve / WBC  Doxycycline BID   ENDOCRINE A:   Hyperglycemia   P:   Monitor glucose   NEUROLOGIC A:   ETOH Abuse  Anxiety  P:   RASS goal: n/a CIWA protocol  Monitor for withdrawal     FAMILY  - Updates: Sister updated at bedside.  Patient ok for intubation if needed but would not want prolonged support if no chance of recovery.    - Inter-disciplinary family meet or Palliative Care meeting due by:  05/16/15     Noe Gens, NP-C [facility] Pgr: [phone] or if no answer [phone] 05/09/2015, 2:09 PM  Attending Note:  60 year old female with PMH of COPD, tobacco and alcohol abuse who presents to the hospital with a COPD  exacerbation and borderline respiratory failure.  On exam, distant BS and patient is visibly SOB.  I am concerned that she will develop respiratory failure and require intubation.  Also concern for alcohol withdraw.  Will treat as COPD exacerbation and monitor closely in the ICU.  Low threshold for intubation.  Abx as ordered.  Steroids as ordered.  Titrate O2 down for sat of 88-92% only.  The patient is critically ill with multiple organ systems failure and requires high complexity decision making for assessment and support, frequent evaluation and titration of therapies, application of advanced monitoring technologies and extensive interpretation of multiple databases.   Critical Care Time devoted to patient care services described in this note is  35  Minutes. This time reflects time of care of this signee Dr [name]. This critical care time does not reflect procedure time, or teaching time or supervisory time of PA/NP/Med student/Med Resident etc but could involve care discussion time.  Rush Farmer, M.D. [facility]Critical Care Medicine. Pager: [phone]. After hours pager: [phone].

## 2015-05-09 NOTE — Progress Notes (Signed)
eLink Physician-Brief Progress Note Patient Name: Jenny Branch DOB: Oct 16, 1955 MRN: [id number]   Date of Service  05/09/2015  HPI/Events of Note  Camera check on patient with COPD exacerbation and acute on chronic hypercarbic respiratory failure. Currently on BiPAP 12/5 with FiO2 0.3. Blood gas from late this morning appears appropriately compensated. Patient saturation 94% on pulse oximetry with respiratory rate approximately 20. Patient with eyes open and following commands giving me 2 fingers on the appropriate hand. Every week nurse reports patient appears more comfortable than earlier in the day on her camera check.   eICU Interventions  Holding on intubation at this time. Plan for repeat camera check later this evening to continue to closely monitor respiratory status with high risk of intubation.     Intervention Category Major Interventions: Respiratory failure - evaluation and management  Tera Partridge 05/09/2015, 4:18 PM

## 2015-05-09 NOTE — Progress Notes (Deleted)
ABG results at 2002  PH 7.31, PaCO2 63.1, PaO2 549, HCO3 31   RR changed, and O2 changed per RRT. RN aware of ABG values

## 2015-05-09 NOTE — ED Provider Notes (Signed)
CSN: [id number]     Arrival date & time 05/09/15  1007 History   First MD Initiated Contact with Patient 05/09/15 1032     Chief Complaint  Patient presents with  . Shortness of Breath  . Cough     (Consider location/radiation/quality/duration/timing/severity/associated sxs/prior Treatment) HPI Comments: Jenny Branch is a 60 y.o. female with a PMHx of COPD, asthma, HTN, depression, and PTSD, who presents to the ED with complaints of worsening shortness of breath 3 days. She was seen by her primary care doctor's office (Dr. Laurann Montana at [place]) yesterday and prescribed doxycycline and prednisone, which she started taking yesterday, but denies that it is been helping. She states she continues to have cough with white sputum production, worsening shortness of breath, wheezing, and chest tightness. She has tried Mucinex with no relief, heat exposure seems to make it worse. She was given Solu-Medrol 125 mg, 2 g of magnesium, 10 mg of albuterol, and 0.5 mg of Atrovent in route, she is still on the breathing treatment upon arrival. She continues to smoke daily, and has noted some sick contacts around her recently. She states that overall her symptoms started 1 week ago when she developed a mild URI, and then gradually worsened 3 days ago. She has never been admitted to the hospital for COPD exacerbation, and she has never needed intubation. She has an appointment with her regular doctor this afternoon at 3 PM.  She denies any lightheadedness, diaphoresis, leg swelling, recent travel/surgery/immobilization, history of DVT/PE, estrogen use, fevers, chills, chest pain aside from tightness, hemoptysis, abdominal pain, nausea, vomiting, diarrhea, constipation, dysuria, hematuria, rhinorrhea, sore throat, numbness, tingling, or focal weakness.  Patient is a 60 y.o. female presenting with shortness of breath and cough. The history is provided by the patient and medical records. No language interpreter was used.   Shortness of Breath Severity:  Severe Onset quality:  Gradual Duration:  3 days Timing:  Constant Progression:  Worsening Chronicity:  Recurrent Relieved by:  Nothing Exacerbated by: heat exposure. Ineffective treatments: mucinex and prednisone and doxycycline. Associated symptoms: cough, sputum production and wheezing   Associated symptoms: no abdominal pain, no chest pain, no diaphoresis, no ear pain, no fever, no hemoptysis, no sore throat and no vomiting   Risk factors: tobacco use   Risk factors: no family hx of DVT, no hx of PE/DVT, no oral contraceptive use, no prolonged immobilization and no recent surgery   Cough Associated symptoms: shortness of breath and wheezing   Associated symptoms: no chest pain, no chills, no diaphoresis, no ear pain, no fever, no myalgias, no rhinorrhea and no sore throat     Past Medical History  Diagnosis Date  . COPD (chronic obstructive pulmonary disease) ([place])   . Asthma   . Hypertension   . Depression   . PTSD (post-traumatic stress disorder)    Past Surgical History  Procedure Laterality Date  . Tonsillectomy     Family History  Problem Relation Age of Onset  . Rheum arthritis Mother   . Heart failure Father   . Cancer Father     prostate  . Alcoholism Sister    Social History  Substance Use Topics  . Smoking status: Current Every Day Smoker -- 1.50 packs/day for 33 years    Types: Cigarettes  . Smokeless tobacco: Never Used     Comment: down to 15cigs/daily 12/28/14 alc  . Alcohol Use: 8.4 oz/week    14 Glasses of wine per week   OB History  Gravida Para Term Preterm AB TAB SAB Ectopic Multiple Living   0              Review of Systems  Constitutional: Negative for fever, chills and diaphoresis.  HENT: Negative for ear discharge, ear pain, rhinorrhea and sore throat.   Respiratory: Positive for cough, sputum production, chest tightness, shortness of breath and wheezing. Negative for hemoptysis.   Cardiovascular:  Negative for chest pain and leg swelling.  Gastrointestinal: Negative for nausea, vomiting, abdominal pain, diarrhea and constipation.  Genitourinary: Negative for dysuria and hematuria.  Musculoskeletal: Negative for myalgias and arthralgias.  Skin: Negative for color change.  Allergic/Immunologic: Negative for immunocompromised state.  Neurological: Negative for weakness, light-headedness and numbness.  Psychiatric/Behavioral: Negative for confusion.   10 Systems reviewed and are negative for acute change except as noted in the HPI.    Allergies  Penicillins  Home Medications   Prior to Admission medications   Medication Sig Start Date End Date Taking? Authorizing Provider  albuterol (PROVENTIL HFA;VENTOLIN HFA) 108 (90 Base) MCG/ACT inhaler Inhale 2 puffs into the lungs every 6 (six) hours as needed for wheezing. 03/06/15   Juanito Doom, MD  albuterol (PROVENTIL) (2.5 MG/3ML) 0.083% nebulizer solution Take 3 mLs (2.5 mg total) by nebulization every 4 (four) hours as needed. 11/14/14   Tanda Rockers, MD  budesonide-formoterol [facility]) 160-4.5 MCG/ACT inhaler Take 2 puffs first thing in am and then another 2 puffs about 12 hours later. 03/06/15   Juanito Doom, MD  clonazePAM (KLONOPIN) 0.5 MG tablet Take 0.25 mg by mouth 3 (three) times daily as needed for anxiety.    Historical Provider, MD  dextromethorphan-guaiFENesin (MUCINEX DM) 30-600 MG per 12 hr tablet Take 1-2 tablets by mouth every 12 (twelve) hours. With water For congestion and cough     Historical Provider, MD  fish oil-omega-3 fatty acids 1000 MG capsule Take 1 g by mouth daily.      Historical Provider, MD  losartan-hydrochlorothiazide (HYZAAR) 50-12.5 MG per tablet Take 1 tablet by mouth daily. 06/29/14   Juanito Doom, MD  mometasone (NASONEX) 50 MCG/ACT nasal spray Place 2 sprays into the nose daily. 03/28/15   Juanito Doom, MD  nystatin (MYCOSTATIN) 100000 UNIT/ML suspension Take 5 mLs (500,000 Units  total) by mouth 4 (four) times daily. 11/14/14   Tanda Rockers, MD  PARoxetine (PAXIL) 10 MG tablet Take 1 tablet (10 mg total) by mouth every morning. 06/29/14   Juanito Doom, MD  predniSONE (DELTASONE) 5 MG tablet TAKE 1 TABLET BY MOUTH DAILY WITH BREAKFAST 03/16/15   Juanito Doom, MD  tiotropium (SPIRIVA) 18 MCG inhalation capsule Place 1 capsule (18 mcg total) into inhaler and inhale daily. 03/06/15   Juanito Doom, MD  traZODone (DESYREL) 50 MG tablet Take 50 mg by mouth at bedtime as needed for sleep.    Historical Provider, MD   BP 167/108 mmHg  Pulse 114  Temp(Src) 98.3 F (36.8 C) (Oral)  Resp 27  SpO2 100% Physical Exam  Constitutional: She is oriented to person, place, and time. She appears well-developed and well-nourished.  Non-toxic appearance. She appears distressed (respiratory difficulty).  Afebrile, nontoxic, but with significant respiratory difficulty, facial rubor noted  HENT:  Head: Normocephalic and atraumatic.  Mouth/Throat: Oropharynx is clear and moist and mucous membranes are normal.  Eyes: Conjunctivae and EOM are normal. Right eye exhibits no discharge. Left eye exhibits no discharge.  Neck: Normal range of motion. Neck supple.  Cardiovascular: Regular rhythm, normal heart sounds and intact distal pulses.  Tachycardia present.  Exam reveals no gallop and no friction rub.   No murmur heard. Tachycardic likely from the albuterol treatment she is currently receiving. Reg rhythm, nl s1/s2, no m/r/g audible but somewhat difficult to hear due to breathing tx and wheezing obscuring sounds, distal pulses intact, no pedal edema.  Pulmonary/Chest: Accessory muscle usage present. Tachypnea noted. She is in respiratory distress. She has decreased breath sounds. She has wheezes. She has no rhonchi. She has no rales.  Diminished breath sounds throughout with expiratory wheezing throughout, tachypneic with accessory muscle use, moderate respiratory distress noted with  pt only able to speak 1-2 words between each breath. No rhonchi or rales audible although difficult to appreciate due to diminished sounds. SpO2 100% on breathing tx oxygen/albuterol  Abdominal: Soft. Normal appearance and bowel sounds are normal. She exhibits no distension. There is no tenderness. There is no rigidity, no rebound, no guarding, no CVA tenderness, no tenderness at McBurney's point and negative Murphy's sign.  Musculoskeletal: Normal range of motion.  MAE x4 Strength and sensation grossly intact Distal pulses intact No pedal edema  Neurological: She is alert and oriented to person, place, and time. She has normal strength. No sensory deficit.  Skin: Skin is warm, dry and intact. No rash noted.  Psychiatric: She has a normal mood and affect.  Nursing note and vitals reviewed.   ED Course  Procedures (including critical care time)  CRITICAL CARE-respiratory distress requiring Bipap Performed by: Corine Shelter   Total critical care time: 45 minutes  Critical care time was exclusive of separately billable procedures and treating other patients.  Critical care was necessary to treat or prevent imminent or life-threatening deterioration.  Critical care was time spent personally by me on the following activities: development of treatment plan with patient and/or surrogate as well as nursing, discussions with consultants, evaluation of patient's response to treatment, examination of patient, obtaining history from patient or surrogate, ordering and performing treatments and interventions, ordering and review of laboratory studies, ordering and review of radiographic studies, pulse oximetry and re-evaluation of patient's condition.  Labs Review Labs Reviewed  CBC WITH DIFFERENTIAL/PLATELET - Abnormal; Notable for the following:    WBC 16.6 (*)    Hemoglobin 17.2 (*)    HCT 49.7 (*)    MCV 104.2 (*)    MCH 36.1 (*)    Neutro Abs 16.1 (*)    Lymphs Abs 0.5  (*)    Monocytes Absolute 0.0 (*)    All other components within normal limits  BASIC METABOLIC PANEL - Abnormal; Notable for the following:    Sodium 129 (*)    Potassium 3.0 (*)    Chloride 84 (*)    Glucose, Bld 149 (*)    Anion gap 16 (*)    All other components within normal limits  BLOOD GAS, ARTERIAL - Abnormal; Notable for the following:    pH, Arterial 7.322 (*)    pCO2 arterial 58.2 (*)    pO2, Arterial 267 (*)    Bicarbonate 29.2 (*)    All other components within normal limits  I-STAT CG4 LACTIC ACID, ED - Abnormal; Notable for the following:    Lactic Acid, Venous 2.54 (*)    All other components within normal limits  BRAIN NATRIURETIC PEPTIDE  BLOOD GAS, ARTERIAL  I-STAT TROPOININ, ED    Imaging Review Dg Chest Port 1 View  05/09/2015  CLINICAL DATA:  Shortness of  breath and cough over the past few days EXAM: PORTABLE CHEST 1 VIEW COMPARISON:  07/28/2013 FINDINGS: Cardiac shadow is stable. The lungs are well aerated bilaterally. No focal infiltrate or sizable effusion is seen. No acute bony abnormality is noted. IMPRESSION: No active disease. Electronically Signed   By: Inez Catalina M.D.   On: 05/09/2015 11:01   I have personally reviewed and evaluated these images and lab results as part of my medical decision-making.   EKG Interpretation   Date/Time:  Wednesday May 09 2015 10:19:18 EST Ventricular Rate:  109 PR Interval:  187 QRS Duration: 93 QT Interval:  326 QTC Calculation: 439 R Axis:   100 Text Interpretation:  Sinus tachycardia Biatrial enlargement Right axis  deviation Low voltage, precordial leads No acute changes No old tracing to  compare Confirmed by NANAVATI, MD, Thelma Comp [id number]) on 05/09/2015 10:42:34 AM      MDM   Final diagnoses:  SOB (shortness of breath)  COPD exacerbation (HCC)  Cough  Tachycardia  Essential hypertension  Respiratory distress  Leukocytosis  Hypokalemia    60 y.o. female here with SOB and COPD exacerbation. Was  seen by her PCP's office yesterday, started on pred/doxy, but doesn't feel like she's getting any better, having quite a bit of difficulty breathing. Was given 10mg  albuterol, 0.5mg  atrovent, 125mg  solumedrol, and 2g mg en route, and still has quite a bit of wheezing on exam. Pt tachypneic and using accessory muscles to breathe, unable to get many words out at a time. Tachycardic likely due to the breathing tx. No LE swelling,no RFs for PE, highly doubt this as a cause. EKG without acute findings, rather poor quality due to condition of pt. Will get labs and CXR, will order another dose of Mg and atrovent, as well as CAT albuterol. If still having significant difficulty, will contemplate bipap. Will discuss case with my attending Dr. Kathrynn Humble. Pt will likely need admission for her COPD exacerbation.   11:05 AM Dr. Kathrynn Humble states we don't need to give another 2g Mg, will d/c this. Would like to start BiPaP now so pt doesn't tire out, which it appears she is already because she falls asleep between exams. Portable CXR showing no infiltrate or effusion, no cardiomegaly, which is all reassuring. Will monitor closely.   12:44 PM Did well on Bipap but is getting anxious and wanted it off, will give ativan now, but she was already taken off the bipap and is back to working hard to breathe. ABG showed mildly low pH with high CO2. Trop neg, CBC w/diff showing WBC 16.6 but H/H elevated likely due to dehydration vs polycythemia from chronic respiratory failure vs demargination from steroids, etc. Na 129, K 3.0 (likely from albuterol dosing), will replete with PO Kdur. Anion gap 16, could be from insensible losses from respiratory status, will give lactated ringers bolus which will help with K repletion as well. Will proceed with admission. Lung sounds still very diminished and wheezing, pt still tachypneic.   12:56 PM Dr. Doyle Askew of [facility] returning page, would like lactic and repeat ABG drawn now, would like to have [facility]  consulted so they're aware of pt, but she will come see pt now and admit pt. Will await [facility] consultation return call.  1:11 PM Dr. Lamonte Sakai of [facility] returning page, he is aware of pt and will see them in consultation. Lactic acid result 2.54, mildly elevated, fluids running at this time. Please see Dr. Doyle Askew notes for further documentation of care and remaining  results (repeat ABG and BNP).  BP 156/76 mmHg  Pulse 121  Temp(Src) 98.3 F (36.8 C) (Oral)  Resp 28  SpO2 100%  Meds ordered this encounter  Medications  . DISCONTD: magnesium sulfate IVPB 2 g 50 mL    Sig:   . albuterol (PROVENTIL,VENTOLIN) solution continuous neb    Sig:   . ipratropium (ATROVENT) nebulizer solution 0.5 mg    Sig:   . LORazepam (ATIVAN) injection 0.5 mg    Sig:   . lactated ringers bolus 1,000 mL    Sig:   . potassium chloride SA (K-DUR,KLOR-CON) CR tablet 40 mEq    Sig:      Rosealee Recinos Camprubi-Soms, PA-C 05/09/15 1319  Varney Biles, MD 05/10/15 [phone]

## 2015-05-09 NOTE — H&P (Addendum)
[facility] History and Physical  Jenny Branch [id number] DOB: 08-17-1955 DOA: 05/09/2015  Referring physician: ED PA, Dewitt Hoes PCP: Shamleffer, [name], MD   Chief Complaint: dyspnea   HPI:  60 y.o. female with known COPD, asthma, HTN, depression, and PTSD, who presented to [facility] ED with main concern of several days duration of progressively worsening dyspnea, that initially started with exertion and has progressed to dyspnea at rest in the past 24 hours, this has been associated with mixed episodes of non productive and productive cough of white sputum, chest tightness that occurs with coughing spells, malaise and poor oral intake. She went to see her PCP and was started on doxycycline and prednisone but her symptoms have not improved and she presented to ED. She reports similar events in the past that were due to COPD but previous episodes were not as severe. Pt denies abd or urinary concerns, no known sick contacts or exposures.   In ED, pt was in significant distress and was using accessory muscles for breathing, she has required BiPAP and has responded well. Her ABG was notable for pH 7.322, pCO2 58 mmHg, pO2 267 mmHg, blood work notable for WBC 16 K, Hg 17, Na 129, K 3, TRH asked to admit for further evaluation. Due to high risk for decompensations, PCCM consultation requested. Pt to be admitted to SDU.   Assessment and Plan: Active Problems: Acute respiratory distress due to AECOPD with hypercarbia / Hx of tobacco use  - admit to SDU - keep on BiPAP for now - PCCM conuslted for assistance as pt at high risk for deterioration and intubation  - provide BD's scheduled and as needed  - flu panel pending, keep on resp precautions for now - continue with doxycycline BID   Leukocytosis - likely from use of prednisone  - no clear sings of PNA on CXR, continue with doxycycline  - check lactic acid  Hypokalemia - supplement  - check mg - repeat BMP in  AM  Hyponatremia - likely pre renal etiology - monitor   Dehydration  - evident by degree of hemoconcentration - provide hydration and repeat CBC in AM  Lovenox Sq for DVT prophylaxis   Radiological Exams on Admission: Dg Chest Port 1 View  05/09/2015  CLINICAL DATA:  Shortness of breath and cough over the past few days EXAM: PORTABLE CHEST 1 VIEW COMPARISON:  07/28/2013 FINDINGS: Cardiac shadow is stable. The lungs are well aerated bilaterally. No focal infiltrate or sizable effusion is seen. No acute bony abnormality is noted. IMPRESSION: No active disease. Electronically Signed   By: Inez Catalina M.D.   On: 05/09/2015 11:01     Code Status: Full Family Communication: Pt at bedside Disposition Plan: Admit for further evaluation    [name] [id number]   Review of Systems:  Constitutional: Negative for diaphoresis.  HENT: Negative for hearing loss, ear pain, nosebleeds, congestion, sore throat, neck pain, tinnitus and ear discharge.   Eyes: Negative for blurred vision, double vision, photophobia, pain, discharge and redness.  Respiratory: Per HPI Cardiovascular: Negative for claudication and leg swelling.  Gastrointestinal: Negative for nausea, vomiting and abdominal pain.  Genitourinary: Negative for dysuria, urgency, frequency, hematuria and flank pain.  Musculoskeletal: Negative for myalgias, back pain, joint pain and falls.  Skin: Negative for itching and rash.  Neurological: Negative for dizziness and weakness.  Endo/Heme/Allergies: Negative for environmental allergies and polydipsia. Does not bruise/bleed easily.  Psychiatric/Behavioral: Negative for suicidal ideas. The patient is not nervous/anxious.  Past Medical History  Diagnosis Date  . COPD (chronic obstructive pulmonary disease) ([place])   . Asthma   . Hypertension   . Depression   . PTSD (post-traumatic stress disorder)     Past Surgical History  Procedure Laterality Date  . Tonsillectomy       Social History:  reports that she has been smoking Cigarettes.  She has a 49.5 pack-year smoking history. She has never used smokeless tobacco. She reports that she drinks about 8.4 oz of alcohol per week. She reports that she does not use illicit drugs.  Allergies  Allergen Reactions  . Penicillins Itching and Rash    Family History  Problem Relation Age of Onset  . Rheum arthritis Mother   . Heart failure Father   . Cancer Father     prostate  . Alcoholism Sister     Prior to Admission medications   Medication Sig Start Date End Date Taking? Authorizing Provider  albuterol (PROVENTIL HFA;VENTOLIN HFA) 108 (90 Base) MCG/ACT inhaler Inhale 2 puffs into the lungs every 6 (six) hours as needed for wheezing. 03/06/15   Juanito Doom, MD  albuterol (PROVENTIL) (2.5 MG/3ML) 0.083% nebulizer solution Take 3 mLs (2.5 mg total) by nebulization every 4 (four) hours as needed. 11/14/14   Tanda Rockers, MD  budesonide-formoterol [facility]) 160-4.5 MCG/ACT inhaler Take 2 puffs first thing in am and then another 2 puffs about 12 hours later. 03/06/15   Juanito Doom, MD  clonazePAM (KLONOPIN) 0.5 MG tablet Take 0.25 mg by mouth 3 (three) times daily as needed for anxiety.    Historical Provider, MD  dextromethorphan-guaiFENesin (MUCINEX DM) 30-600 MG per 12 hr tablet Take 1-2 tablets by mouth every 12 (twelve) hours. With water For congestion and cough     Historical Provider, MD  fish oil-omega-3 fatty acids 1000 MG capsule Take 1 g by mouth daily.      Historical Provider, MD  losartan-hydrochlorothiazide (HYZAAR) 50-12.5 MG per tablet Take 1 tablet by mouth daily. 06/29/14   Juanito Doom, MD  mometasone (NASONEX) 50 MCG/ACT nasal spray Place 2 sprays into the nose daily. 03/28/15   Juanito Doom, MD  nystatin (MYCOSTATIN) 100000 UNIT/ML suspension Take 5 mLs (500,000 Units total) by mouth 4 (four) times daily. 11/14/14   Tanda Rockers, MD  PARoxetine (PAXIL) 10 MG tablet Take 1  tablet (10 mg total) by mouth every morning. 06/29/14   Juanito Doom, MD  predniSONE (DELTASONE) 5 MG tablet TAKE 1 TABLET BY MOUTH DAILY WITH BREAKFAST 03/16/15   Juanito Doom, MD  tiotropium (SPIRIVA) 18 MCG inhalation capsule Place 1 capsule (18 mcg total) into inhaler and inhale daily. 03/06/15   Juanito Doom, MD  traZODone (DESYREL) 50 MG tablet Take 50 mg by mouth at bedtime as needed for sleep.    Historical Provider, MD    Physical Exam: Filed Vitals:   05/09/15 1145 05/09/15 1153 05/09/15 1200 05/09/15 1230  BP: 161/98  171/142 156/76  Pulse:  121    Temp:      TempSrc:      Resp: 24 32 34 28  SpO2:    100%    Physical Exam  Constitutional: Appears in mild distress due to dyspnea  HENT: Normocephalic. External right and left ear normal. Dry MM Eyes: Conjunctivae and EOM are normal. PERRLA, no scleral icterus.  Neck: Normal ROM. Neck supple. No JVD. No tracheal deviation. No thyromegaly.  CVS: Regular rhythm, tachycardic, S1/S2 +, no  murmurs, no gallops, no carotid bruit.  Pulmonary: accessory muscle use, diffuse wheezing, speaking in short sentences  Abdominal: Soft. BS +,  no distension, tenderness, rebound or guarding.  Musculoskeletal: Normal range of motion. Lymphadenopathy: No lymphadenopathy noted, cervical, inguinal. Neuro: Alert. Normal reflexes, muscle tone coordination. No cranial nerve deficit. Skin: Skin is warm and dry. No rash noted. Not diaphoretic. No erythema. No pallor.  Psychiatric: Normal mood and affect. Behavior, judgment, thought content normal.   Labs on Admission:  Basic Metabolic Panel:  Recent Labs Lab 05/09/15 1116  NA 129*  K 3.0*  CL 84*  CO2 29  GLUCOSE 149*  BUN 13  CREATININE 0.77  CALCIUM 9.3   CBC:  Recent Labs Lab 05/09/15 1116  WBC 16.6*  NEUTROABS 16.1*  HGB 17.2*  HCT 49.7*  MCV 104.2*  PLT 329   EKG: sinus tachy    If 7PM-7AM, please contact night-coverage [email or web address] Password [email or web address] 05/09/2015,  1:32 PM

## 2015-05-09 NOTE — ED Notes (Signed)
DUPLICATE ORDER FOR BIPAP

## 2015-05-09 NOTE — ED Notes (Addendum)
Patient has history copd and went to her PCP this week and was prescribed doxycycline and prednisone. She started both yesterday, but call 911 today for worsening of symptoms today. Patient received solumedrol 125mg  and 2 grams of magnesium sulfate via EMS. 10mg  of albuterol and 555mcg of atrovent. Patient is a current every day smoker.

## 2015-05-09 NOTE — Progress Notes (Signed)
Rt placed pt on BIPAP in ED per MD order. RT also gave pt 10 mg of Albuterol continues neb with 0.5 mg of Atrovent. ABG obtained results given to MD. No further orders at this time for respiratory.

## 2015-05-09 NOTE — Progress Notes (Signed)
RT assisted with Md intubation- uneventful. Positive end tidal, bilateral breath sounds present, cxr pending.

## 2015-05-10 ENCOUNTER — Inpatient Hospital Stay (HOSPITAL_COMMUNITY): Payer: Medicare Other

## 2015-05-10 DIAGNOSIS — Z658 Other specified problems related to psychosocial circumstances: Secondary | ICD-10-CM

## 2015-05-10 DIAGNOSIS — F10231 Alcohol dependence with withdrawal delirium: Secondary | ICD-10-CM

## 2015-05-10 LAB — BLOOD GAS, ARTERIAL
Acid-Base Excess: 3.6 mmol/L — ABNORMAL HIGH (ref 0.0–2.0)
BICARBONATE: 31.4 meq/L — AB (ref 20.0–24.0)
Drawn by: 441661
FIO2: 1
LHR: 18 {breaths}/min
MECHVT: 420 mL
O2 SAT: 99.8 %
PCO2 ART: 63.1 mmHg — AB (ref 35.0–45.0)
PEEP: 5 cmH2O
PO2 ART: 549 mmHg — AB (ref 80.0–100.0)
Patient temperature: 98.6
TCO2: 27.7 mmol/L (ref 0–100)
pH, Arterial: 7.318 — ABNORMAL LOW (ref 7.350–7.450)

## 2015-05-10 LAB — CBC
HCT: 46.2 % — ABNORMAL HIGH (ref 36.0–46.0)
HEMOGLOBIN: 15.4 g/dL — AB (ref 12.0–15.0)
MCH: 35.5 pg — ABNORMAL HIGH (ref 26.0–34.0)
MCHC: 33.3 g/dL (ref 30.0–36.0)
MCV: 106.5 fL — ABNORMAL HIGH (ref 78.0–100.0)
Platelets: 332 10*3/uL (ref 150–400)
RBC: 4.34 MIL/uL (ref 3.87–5.11)
RDW: 14.8 % (ref 11.5–15.5)
WBC: 12.3 10*3/uL — AB (ref 4.0–10.5)

## 2015-05-10 LAB — BASIC METABOLIC PANEL
ANION GAP: 13 (ref 5–15)
BUN: 19 mg/dL (ref 6–20)
CALCIUM: 9.1 mg/dL (ref 8.9–10.3)
CO2: 29 mmol/L (ref 22–32)
Chloride: 89 mmol/L — ABNORMAL LOW (ref 101–111)
Creatinine, Ser: 1.03 mg/dL — ABNORMAL HIGH (ref 0.44–1.00)
GFR, EST NON AFRICAN AMERICAN: 59 mL/min — AB (ref >60)
Glucose, Bld: 154 mg/dL — ABNORMAL HIGH (ref 65–99)
Potassium: 3.6 mmol/L (ref 3.5–5.1)
Sodium: 131 mmol/L — ABNORMAL LOW (ref 135–145)

## 2015-05-10 LAB — MAGNESIUM: Magnesium: 1.8 mg/dL (ref 1.7–2.4)

## 2015-05-10 LAB — INFLUENZA PANEL BY PCR (TYPE A & B)
H1N1 flu by pcr: NOT DETECTED
INFLAPCR: NEGATIVE
INFLBPCR: NEGATIVE

## 2015-05-10 LAB — TRIGLYCERIDES: Triglycerides: 130 mg/dL (ref <150)

## 2015-05-10 MED ORDER — SODIUM CHLORIDE 0.9 % IV BOLUS (SEPSIS)
500.0000 mL | Freq: Once | INTRAVENOUS | Status: AC
  Administered 2015-05-10: 500 mL via INTRAVENOUS

## 2015-05-10 MED ORDER — DOXYCYCLINE CALCIUM 50 MG/5ML PO SYRP
100.0000 mg | ORAL_SOLUTION | Freq: Two times a day (BID) | ORAL | Status: DC
  Administered 2015-05-10 – 2015-05-13 (×7): 100 mg
  Filled 2015-05-10 (×14): qty 10

## 2015-05-10 MED ORDER — DEXMEDETOMIDINE HCL IN NACL 400 MCG/100ML IV SOLN
0.4000 ug/kg/h | INTRAVENOUS | Status: DC
  Administered 2015-05-10 (×3): 1.2 ug/kg/h via INTRAVENOUS
  Administered 2015-05-10: 1 ug/kg/h via INTRAVENOUS
  Administered 2015-05-10 – 2015-05-11 (×2): 1.2 ug/kg/h via INTRAVENOUS
  Administered 2015-05-11: 2 ug/kg/h via INTRAVENOUS
  Administered 2015-05-11: 1.255 ug/kg/h via INTRAVENOUS
  Administered 2015-05-11: 2 ug/kg/h via INTRAVENOUS
  Administered 2015-05-11: 1.5 ug/kg/h via INTRAVENOUS
  Administered 2015-05-11: 2 ug/kg/h via INTRAVENOUS
  Administered 2015-05-11: 1.2 ug/kg/h via INTRAVENOUS
  Administered 2015-05-11 – 2015-05-12 (×10): 2 ug/kg/h via INTRAVENOUS
  Administered 2015-05-13: 1.8 ug/kg/h via INTRAVENOUS
  Administered 2015-05-13 (×4): 2 ug/kg/h via INTRAVENOUS
  Administered 2015-05-13: 1 ug/kg/h via INTRAVENOUS
  Administered 2015-05-13 (×4): 2 ug/kg/h via INTRAVENOUS
  Administered 2015-05-14: 1.4 ug/kg/h via INTRAVENOUS
  Administered 2015-05-14 (×3): 1.8 ug/kg/h via INTRAVENOUS
  Administered 2015-05-14 (×2): 1.6 ug/kg/h via INTRAVENOUS
  Administered 2015-05-14: 2 ug/kg/h via INTRAVENOUS
  Administered 2015-05-15: 1.598 ug/kg/h via INTRAVENOUS
  Administered 2015-05-15: 0.5 ug/kg/h via INTRAVENOUS
  Administered 2015-05-15 (×2): 1.6 ug/kg/h via INTRAVENOUS
  Administered 2015-05-17: 0.4 ug/kg/h via INTRAVENOUS
  Filled 2015-05-10: qty 50
  Filled 2015-05-10 (×2): qty 100
  Filled 2015-05-10: qty 50
  Filled 2015-05-10 (×7): qty 100
  Filled 2015-05-10: qty 50
  Filled 2015-05-10: qty 100
  Filled 2015-05-10: qty 50
  Filled 2015-05-10: qty 100
  Filled 2015-05-10: qty 50
  Filled 2015-05-10 (×13): qty 100
  Filled 2015-05-10 (×3): qty 50
  Filled 2015-05-10 (×6): qty 100
  Filled 2015-05-10: qty 50
  Filled 2015-05-10 (×3): qty 100
  Filled 2015-05-10: qty 50
  Filled 2015-05-10 (×3): qty 100

## 2015-05-10 MED ORDER — LORAZEPAM 2 MG/ML IJ SOLN
4.0000 mg | Freq: Once | INTRAMUSCULAR | Status: AC
  Administered 2015-05-10: 4 mg via INTRAVENOUS
  Filled 2015-05-10: qty 2

## 2015-05-10 NOTE — Progress Notes (Signed)
PULMONARY / CRITICAL CARE MEDICINE   Name: Jenny Branch MRN: [id number] DOB: 05/21/1955    ADMISSION DATE:  05/09/2015 CONSULTATION DATE:  05/09/15  REFERRING MD:  Dr. Doyle Askew   CHIEF COMPLAINT:  Respiratory Distress / AECOPD   BRIEF:  60 y/o female followed by BQ in the pulmonary office with COPD and ongoing tobacco use who was intubated on 3/9 for an AE of COPD.    SUBJECTIVE:  Intubated Severe agitation Severe vent dyssynchrony  VITAL SIGNS: BP 126/98 mmHg  Pulse 117  Temp(Src) 100 F (37.8 C) (Oral)  Resp 21  Ht 5\' 3"  (1.6 m)  Wt 156 lb 12 oz (71.1 kg)  BMI 27.77 kg/m2  SpO2 94%  HEMODYNAMICS:    VENTILATOR SETTINGS: Vent Mode:  [-] PRVC FiO2 (%):  [30 %-100 %] 40 % Set Rate:  [18 bmp] 18 bmp Vt Set:  [420 mL] 420 mL PEEP:  [5 cmH20] 5 cmH20 Plateau Pressure:  [21 cmH20-43 cmH20] 43 cmH20  INTAKE / OUTPUT: I/O last 3 completed shifts: In: 868.8 [I.V.:868.8] Out: 615 [Urine:215; Emesis/NG output:400]  PHYSICAL EXAMINATION: General:  Sedated on vent HENT: NCAT, ETT in place, PERRL PULM: Wheezing bilaterally CV: Tachy, regular, no mgr GI: abdomen distended, minimal bowel sounds MSK: normal bulk and tone Derm: red all over, diaphoretic Neuro: Sedated on vent  LABS:  BMET  Recent Labs Lab 05/09/15 1116 05/10/15 0307  NA 129* 131*  K 3.0* 3.6  CL 84* 89*  CO2 29 29  BUN 13 19  CREATININE 0.77 1.03*  GLUCOSE 149* 154*    Electrolytes  Recent Labs Lab 05/09/15 1116 05/10/15 0307  CALCIUM 9.3 9.1  MG  --  1.8    CBC  Recent Labs Lab 05/09/15 1116 05/10/15 0307  WBC 16.6* 12.3*  HGB 17.2* 15.4*  HCT 49.7* 46.2*  PLT 329 332    Coag's No results for input(s): APTT, INR in the last 168 hours.  Sepsis Markers  Recent Labs Lab 05/09/15 1316  LATICACIDVEN 2.54*    ABG  Recent Labs Lab 05/09/15 1135 05/09/15 1754 05/09/15 2002  PHART 7.322* 7.317* 7.318*  PCO2ART 58.2* 63.2* 63.1*  PO2ART 267* 82.4 549*    Liver  Enzymes No results for input(s): AST, ALT, ALKPHOS, BILITOT, ALBUMIN in the last 168 hours.  Cardiac Enzymes No results for input(s): TROPONINI, PROBNP in the last 168 hours.  Glucose No results for input(s): GLUCAP in the last 168 hours.  Imaging  3/9 CXR images personally reviewed> ETT in place, no infiltrate, emphysema noted 3/8 KUB> OG tube in stomach  STUDIES:    CULTURES: Flu PCR 3/8 nasal > neg Flu PCR 3/9 trach asp >   ANTIBIOTICS: Doxycycline 3/8 >>   SIGNIFICANT EVENTS: 3/08  Admit with COPD exacerbation, intubated  LINES/TUBES:   DISCUSSION: 60 y/o F, smoker, ETOH use admitted on 3/8 with concern for AECOPD requiring intubation.  Has worsening tachycardia, agitation on 3/9 consistent with alcohol withdrawal.    ASSESSMENT / PLAN:  PULMONARY A: Acute Hypercarbic Respiratory Failure  COPD Exacerbation Baseline GOLD D, FEV1 41% pred Tobacco Abuse > ongoing P:   Full vent support  Change sedation for better vent synchrony, see below Continue solumedrol Continue duoneb VAP prevention  CARDIOVASCULAR A:  Tachycardia - in setting of COPD exacerbation, beta agonists and alcohol withdrawal P:  Tele  RENAL A:   Hyponatremia > slightly better Hypokalemia > slightly better Anion Gap Acidosis - resolved Mild increase in Cr overnight P:  Continue IVF, increase rate Monitor BMET and UOP Replace electrolytes as needed  GASTROINTESTINAL A:   Abdominal distension> ileus vs SBO P:   Repeat KUB Start tube feedings when belly exam improves Continue PPI for stress ulcer prophylaxis  HEMATOLOGIC A:   Macrocytosis  P:  Trend CBC  Heparin SQ for DVT prophylaxis   INFECTIOUS A:   AECOPD  R/o Flu - sick greater than one week P:   No role Tamilflu given duration of symptoms Repeat flu with tracheal secretions Monitor fever curve / WBC  Doxycycline BID   ENDOCRINE A:   Hyperglycemia   P:   Monitor glucose   NEUROLOGIC A:   Acute  encephalopathy due to ETOH withdrawal Anxiety  Severe vent dyssynchrony P:   RASS goal -2 Thiamine Folate Fentanyl gtt Change propofol to Precedex Prn versed IV ativan now 4mg    FAMILY  - Updates: No family at bedside 3/9  - Inter-disciplinary family meet or Palliative Care meeting due by:  05/16/15  My cc time 38 minutes  Roselie Awkward, MD [facility] Pager: [phone] Cell: [phone] After 3pm or if no response, call [phone]

## 2015-05-10 NOTE — Progress Notes (Signed)
ABG results at 2002  PH 7.31, PaCO2 63.1, PaO2 549, HCO3 31 RN aware of ABG results

## 2015-05-10 NOTE — Progress Notes (Signed)
PT Cancellation Note  Patient Details Name: Jenny Branch MRN: [id number] DOB: 1955-09-11   Cancelled Treatment:    Reason Eval/Treat Not Completed: Medical issues which prohibited therapy (required intubation overnight. check back 3/10 for medical stability/extubation.)   Marcelino Freestone PT [id number]  05/10/2015, 6:57 AM

## 2015-05-10 NOTE — Progress Notes (Signed)
MD Simon aware of ABG result. MD stated for no changes to be made at this time.

## 2015-05-10 NOTE — Care Management Note (Signed)
Case Management Note  Patient Details  Name: CHLOIE RASO MRN: [id number] Date of Birth: [birth year]  Subjective/Objective:     resp failure               Action/Plan: Date:  May 10, 2015 Chart reviewed for concurrent status and case management needs. Will continue to follow patient for changes and needs: Velva Harman, BSN, RN, [place]   [phone]  Expected Discharge Date:   (UNKNOWN)               Expected Discharge Plan:  Home/Self Care  In-House Referral:  NA  Discharge planning Services  CM Consult  Post Acute Care Choice:  NA Choice offered to:  NA  DME Arranged:    DME Agency:     HH Arranged:    HH Agency:     Status of Service:  In process, will continue to follow  Medicare Important Message Given:    Date Medicare IM Given:    Medicare IM give by:    Date Additional Medicare IM Given:    Additional Medicare Important Message give by:     If discussed at [place] of Stay Meetings, dates discussed:    Additional Comments:  Leeroy Cha, RN 05/10/2015, 8:59 AM

## 2015-05-10 NOTE — Progress Notes (Signed)
eLink Physician-Brief Progress Note Patient Name: Jenny Branch DOB: 1956/01/28 MRN: [id number]   Date of Service  05/10/2015  HPI/Events of Note  Bedside nurse reports borderline hypotension. Systolic blood pressure 87 & mean arterial pressure 69. Patient currently on fentanyl & Precedex infusions. She is gradually titrating down on fentanyl infusion to improve blood pressure. Minimal urine output and overall even volume status. Currently requiring FiO2 0.4.  eICU Interventions  Bolus 500 mL normal saline over 1 hour.     Intervention Category Major Interventions: Hypotension - evaluation and management  Tera Partridge 05/10/2015, 5:44 PM

## 2015-05-10 NOTE — Progress Notes (Signed)
OT Cancellation Note  Patient Details Name: Jenny Branch MRN: [id number] DOB: [birth year]   Cancelled Treatment:    Reason Eval/Treat Not Completed: Medical issues which prohibited therapy (required intubation overnight. OT will check back later date for medical stability/extubation.)  [name] A 05/10/2015, 8:14 AM

## 2015-05-10 NOTE — Progress Notes (Signed)
Spoke with RN, RN stated she spoke with MD Nester earlier in the shift regarding ABG values. MD aware. O2 changed per RT and no other changes are made at this time.

## 2015-05-10 NOTE — Progress Notes (Signed)
Initial Nutrition Assessment  INTERVENTION:   If patient is to remain intubated for >24 hours, recommend nutrition support.  TF recommendations: Initiate Vital AF 1.2 @ 20 ml/hr and increase by 10 ml every 4 hours to goal rate of 40 ml/hr.  30 ml Prostat BID.   Tube feeding regimen provides 1352 kcal (97% of needs), 102 grams of protein, and 779 ml of H2O.   RD to continue to monitor  NUTRITION DIAGNOSIS:   Inadequate oral intake related to inability to eat as evidenced by NPO status.  GOAL:   Patient will meet greater than or equal to 90% of their needs  MONITOR:   Vent status, Labs, Weight trends, I & O's  REASON FOR ASSESSMENT:   Consult, Ventilator COPD Protocol  ASSESSMENT:   60 y.o. female with known COPD, asthma, HTN, depression, and PTSD, who presented to [facility] ED with main concern of several days duration of progressively worsening dyspnea, that initially started with exertion and has progressed to dyspnea at rest in the past 24 hours, this has been associated with mixed episodes of non productive and productive cough of white sputum, chest tightness that occurs with coughing spells, malaise and poor oral intake.  Patient in room with RN at bedside. No family present at this time. Per RN, no plan for TF yet. Pt's stomach is very distended currently, waiting to see if this improves. RD to provide recommendations if needed.  Per H&P, pt with history of ETOH uses, reports drinking 2-3 "drinks" a day, unsure what equals a drink for the patient. Per weight history, pt has lost 11 lb since 10/06 (7% wt loss x 5 months, insignificant for time frame). Nutrition focused physical exam shows no sign of depletion of muscle mass or body fat.  Patient is currently intubated on ventilator support MV: 10 L/min Temp (24hrs), Avg:99.3 F (37.4 C), Min:97.9 F (36.6 C), Max:100.9 F (38.3 C)  Propofol: hanging but not infusing, stopped.  Medications: K-DUR once Labs reviewed: Low  Na Mg WNL  Diet Order:  Diet NPO time specified  Skin:  Reviewed, no issues  Last BM:  PTA  Height:   Ht Readings from Last 1 Encounters:  05/09/15 5\' 3"  (1.6 m)    Weight:   Wt Readings from Last 1 Encounters:  05/10/15 156 lb 12 oz (71.1 kg)    Ideal Body Weight:  52.3 kg  BMI:  Body mass index is 27.77 kg/(m^2).  Estimated Nutritional Needs:   Kcal:  1395  Protein:  100-110g  Fluid:  1.5L/day  EDUCATION NEEDS:   No education needs identified at this time  Clayton Bibles, MS, RD, LDN Pager: [phone] After Hours Pager: [phone]

## 2015-05-11 ENCOUNTER — Inpatient Hospital Stay (HOSPITAL_COMMUNITY): Payer: Medicare Other

## 2015-05-11 DIAGNOSIS — N179 Acute kidney failure, unspecified: Secondary | ICD-10-CM

## 2015-05-11 DIAGNOSIS — G9340 Encephalopathy, unspecified: Secondary | ICD-10-CM

## 2015-05-11 DIAGNOSIS — J9600 Acute respiratory failure, unspecified whether with hypoxia or hypercapnia: Secondary | ICD-10-CM | POA: Insufficient documentation

## 2015-05-11 LAB — BASIC METABOLIC PANEL
Anion gap: 8 (ref 5–15)
BUN: 33 mg/dL — AB (ref 6–20)
CO2: 27 mmol/L (ref 22–32)
Calcium: 7.9 mg/dL — ABNORMAL LOW (ref 8.9–10.3)
Chloride: 95 mmol/L — ABNORMAL LOW (ref 101–111)
Creatinine, Ser: 1.5 mg/dL — ABNORMAL HIGH (ref 0.44–1.00)
GFR calc Af Amer: 44 mL/min — ABNORMAL LOW (ref >60)
GFR, EST NON AFRICAN AMERICAN: 38 mL/min — AB (ref >60)
Glucose, Bld: 156 mg/dL — ABNORMAL HIGH (ref 65–99)
POTASSIUM: 4.3 mmol/L (ref 3.5–5.1)
SODIUM: 136 mmol/L (ref 135–145)

## 2015-05-11 LAB — CBC WITH DIFFERENTIAL/PLATELET
BASOS ABS: 0 10*3/uL (ref 0.0–0.1)
Basophils Relative: 0 %
EOS ABS: 0 10*3/uL (ref 0.0–0.7)
EOS PCT: 0 %
HCT: 44.2 % (ref 36.0–46.0)
Hemoglobin: 14.3 g/dL (ref 12.0–15.0)
LYMPHS PCT: 3 %
Lymphs Abs: 0.4 10*3/uL — ABNORMAL LOW (ref 0.7–4.0)
MCH: 35.5 pg — ABNORMAL HIGH (ref 26.0–34.0)
MCHC: 32.4 g/dL (ref 30.0–36.0)
MCV: 109.7 fL — AB (ref 78.0–100.0)
Monocytes Absolute: 0.7 10*3/uL (ref 0.1–1.0)
Monocytes Relative: 5 %
Neutro Abs: 11.8 10*3/uL — ABNORMAL HIGH (ref 1.7–7.7)
Neutrophils Relative %: 92 %
PLATELETS: 256 10*3/uL (ref 150–400)
RBC: 4.03 MIL/uL (ref 3.87–5.11)
RDW: 14.8 % (ref 11.5–15.5)
WBC: 12.8 10*3/uL — AB (ref 4.0–10.5)

## 2015-05-11 MED ORDER — CLONAZEPAM 0.5 MG PO TABS
0.2500 mg | ORAL_TABLET | Freq: Three times a day (TID) | ORAL | Status: DC
  Administered 2015-05-11 – 2015-05-13 (×10): 0.25 mg via ORAL
  Filled 2015-05-11 (×9): qty 1

## 2015-05-11 MED ORDER — CLONIDINE HCL 0.1 MG PO TABS
0.1000 mg | ORAL_TABLET | Freq: Two times a day (BID) | ORAL | Status: DC
  Administered 2015-05-11 – 2015-05-13 (×6): 0.1 mg
  Filled 2015-05-11 (×7): qty 1

## 2015-05-11 NOTE — Progress Notes (Signed)
PT Cancellation Note  Patient Details Name: Jenny Branch MRN: [id number] DOB: June 12, 1955   Cancelled Treatment:    Reason Eval/Treat Not Completed: Medical issues which prohibited therapy (remains on vent)   Claretha Cooper 05/11/2015, 7:25 AM Tresa Endo PT [phone]

## 2015-05-11 NOTE — Progress Notes (Signed)
Physical Therapy Discharge Patient Details Name: Jenny Branch MRN: [id number] DOB: 1955-03-05 Today's Date: 05/11/2015 Time:  -     Patient discharged from PT services secondary to medical decline - will need to re-order PT to resume therapy services. s.      GP     Marcelino Freestone PT [id number]  05/11/2015, 11:04 AM

## 2015-05-11 NOTE — Progress Notes (Signed)
OT Cancellation Note  Patient Details Name: Jenny Branch MRN: [id number] DOB: 20-Sep-1955   Cancelled Treatment:    Reason Eval/Treat Not Completed: Patient not medically ready.  Please reorder OT when pt is medically ready.  Thank you  Kailynne Ferrington 05/11/2015, 11:17 AM  Lesle Chris, OTR/L [phone] 05/11/2015

## 2015-05-11 NOTE — Progress Notes (Signed)
OT Cancellation Note  Patient Details Name: Jenny Branch MRN: [id number] DOB: 10-Jul-1955   Cancelled Treatment:    Reason Eval/Treat Not Completed: Medical issues which prohibited therapy.  Pt remains on vent. Will check back Monday   Zanita Millman 05/11/2015, 7:27 AM  Lesle Chris, OTR/L [phone] 05/11/2015

## 2015-05-11 NOTE — Progress Notes (Signed)
PULMONARY / CRITICAL CARE MEDICINE   Name: Jenny Branch MRN: [id number] DOB: [birth year]    ADMISSION DATE:  05/09/2015 CONSULTATION DATE:  05/09/15  REFERRING MD:  Dr. Doyle Askew   CHIEF COMPLAINT:  Respiratory Distress / AECOPD   BRIEF:  60 y/o female followed by BQ in the pulmonary office with COPD and ongoing tobacco use who was intubated on 3/9 for an AE of COPD.     SUBJECTIVE: RN reports pt remains on 150 mcg of fentanyl, 2 precedex.  Concern for DT's.  Family reports she has a lot of phobias.  Severe agitation with bathing last pm.  Normotensive/no pressors.    VITAL SIGNS: BP 130/93 mmHg  Pulse 77  Temp(Src) 98.8 F (37.1 C) (Core (Comment))  Resp 15  Ht 5\' 3"  (1.6 m)  Wt 161 lb 13.1 oz (73.4 kg)  BMI 28.67 kg/m2  SpO2 99%  HEMODYNAMICS:    VENTILATOR SETTINGS: Vent Mode:  [-] PRVC FiO2 (%):  [40 %] 40 % Set Rate:  [18 bmp] 18 bmp Vt Set:  [420 mL] 420 mL PEEP:  [5 cmH20] 5 cmH20 Plateau Pressure:  [23 cmH20-39 cmH20] 25 cmH20  INTAKE / OUTPUT: I/O last 3 completed shifts: In: 4485.1 [I.V.:4455.1; NG/GT:30] Out: 1705 [Urine:905; Emesis/NG output:800]  PHYSICAL EXAMINATION: General:  Sedated on vent HENT: NCAT, ETT in place, PERRL PULM: even/non-labored, diminished bilaterally but good air movement, no wheeze CV: s1s2 rrr, no mgr GI: abdomen distended, minimal bowel sounds MSK: normal bulk and tone Derm: red all over, diaphoretic Neuro: Sedated on vent, MAE spontaneously  LABS:  BMET  Recent Labs Lab 05/09/15 1116 05/10/15 0307 05/11/15 0309  NA 129* 131* 130*  K 3.0* 3.6 4.3  CL 84* 89* 95*  CO2 29 29 27   BUN 13 19 33*  CREATININE 0.77 1.03* 1.50*  GLUCOSE 149* 154* 156*    Electrolytes  Recent Labs Lab 05/09/15 1116 05/10/15 0307 05/11/15 0309  CALCIUM 9.3 9.1 7.9*  MG  --  1.8  --     CBC  Recent Labs Lab 05/09/15 1116 05/10/15 0307 05/11/15 0309  WBC 16.6* 12.3* 12.8*  HGB 17.2* 15.4* 14.3  HCT 49.7* 46.2* 44.2  PLT 329  332 256    Coag's No results for input(s): APTT, INR in the last 168 hours.  Sepsis Markers  Recent Labs Lab 05/09/15 1316  LATICACIDVEN 2.54*    ABG  Recent Labs Lab 05/09/15 1135 05/09/15 1754 05/09/15 2002  PHART 7.322* 7.317* 7.318*  PCO2ART 58.2* 63.2* 63.1*  PO2ART 267* 82.4 549*    Liver Enzymes No results for input(s): AST, ALT, ALKPHOS, BILITOT, ALBUMIN in the last 168 hours.  Cardiac Enzymes No results for input(s): TROPONINI, PROBNP in the last 168 hours.  Glucose No results for input(s): GLUCAP in the last 168 hours.  Imaging  3/9 CXR images personally reviewed >> ETT in place, no infiltrate, emphysema noted 3/8 KUB >> OG tube in stomach  STUDIES:    CULTURES: Flu PCR 3/8 nasal > neg Flu PCR 3/9 trach asp > neg  ANTIBIOTICS: Doxycycline 3/8 >>   SIGNIFICANT EVENTS: 3/08  Admit with COPD exacerbation, intubated 3/10  Intermittent agitation, concern for ETOH w/d on precedex   LINES/TUBES: ETT 3/8 >>   DISCUSSION: 60 y/o F, smoker, ETOH use admitted on 3/8 with concern for AECOPD requiring intubation.  Has worsening tachycardia, agitation on 3/9 consistent with alcohol withdrawal.    ASSESSMENT / PLAN:  PULMONARY A: Acute Hypercarbic Respiratory Failure  COPD  Exacerbation Baseline GOLD D, FEV1 41% pred Tobacco Abuse > ongoing P:   PRVC support, 8cc/kg Continue solumedrol Continue duoneb VAP prevention Intermittent CXR Daily SBT / PSV as tolerated   CARDIOVASCULAR A:  Tachycardia - in setting of COPD exacerbation, beta agonists and alcohol withdrawal, resolved.  P:  Tele monitoring   RENAL A:   Hyponatremia > slightly better Hypokalemia > slightly better Anion Gap Acidosis - resolved AKI  P:   Continue IVF, NS @ 100 ml/hr Monitor BMET and UOP Replace electrolytes as needed  GASTROINTESTINAL A:   Abdominal distension> ileus vs SBO P:   Start tube feedings when belly exam improves Continue PPI for stress ulcer  prophylaxis  HEMATOLOGIC A:   Macrocytosis - ? ETOH related P:  Trend CBC  Heparin SQ for DVT prophylaxis   INFECTIOUS A:   AECOPD  R/o Flu - sick greater than one week P:   No role Tamilflu given duration of symptoms Monitor fever curve / WBC  Doxycycline BID   ENDOCRINE A:   Hyperglycemia   P:   Monitor glucose   NEUROLOGIC A:   Acute encephalopathy due to ETOH withdrawal Anxiety  Severe vent dyssynchrony P:   RASS goal -2 Thiamine Folate Fentanyl gtt Continue Precedex PRN versed Resume home klonopin   FAMILY  - Updates: Family updated at bedside 3/10 am  - Inter-disciplinary family meet or Palliative Care meeting due by:  05/16/15    Noe Gens, NP-C [facility] Pgr: [phone] or if no answer [phone] 05/11/2015, 10:20 AM

## 2015-05-12 ENCOUNTER — Inpatient Hospital Stay (HOSPITAL_COMMUNITY): Payer: Medicare Other

## 2015-05-12 DIAGNOSIS — J9601 Acute respiratory failure with hypoxia: Secondary | ICD-10-CM

## 2015-05-12 DIAGNOSIS — R319 Hematuria, unspecified: Secondary | ICD-10-CM

## 2015-05-12 LAB — CBC WITH DIFFERENTIAL/PLATELET
BASOS ABS: 0 10*3/uL (ref 0.0–0.1)
Basophils Relative: 0 %
Eosinophils Absolute: 0 10*3/uL (ref 0.0–0.7)
Eosinophils Relative: 0 %
HCT: 46 % (ref 36.0–46.0)
HEMOGLOBIN: 14.9 g/dL (ref 12.0–15.0)
LYMPHS ABS: 1.8 10*3/uL (ref 0.7–4.0)
Lymphocytes Relative: 13 %
MCH: 36 pg — AB (ref 26.0–34.0)
MCHC: 32.4 g/dL (ref 30.0–36.0)
MCV: 111.1 fL — ABNORMAL HIGH (ref 78.0–100.0)
MONOS PCT: 0 %
Monocytes Absolute: 0 10*3/uL — ABNORMAL LOW (ref 0.1–1.0)
NEUTROS ABS: 12.2 10*3/uL — AB (ref 1.7–7.7)
Neutrophils Relative %: 87 %
Platelets: 277 10*3/uL (ref 150–400)
RBC: 4.14 MIL/uL (ref 3.87–5.11)
RDW: 14.9 % (ref 11.5–15.5)
WBC: 14 10*3/uL — ABNORMAL HIGH (ref 4.0–10.5)

## 2015-05-12 LAB — BASIC METABOLIC PANEL
ANION GAP: 10 (ref 5–15)
Anion gap: 9 (ref 5–15)
BUN: 29 mg/dL — AB (ref 6–20)
BUN: 23 mg/dL — ABNORMAL HIGH (ref 6–20)
CALCIUM: 8.7 mg/dL — AB (ref 8.9–10.3)
CALCIUM: 8.4 mg/dL — AB (ref 8.9–10.3)
CHLORIDE: 104 mmol/L (ref 101–111)
CO2: 30 mmol/L (ref 22–32)
CO2: 30 mmol/L (ref 22–32)
CREATININE: 0.81 mg/dL (ref 0.44–1.00)
Chloride: 101 mmol/L (ref 101–111)
Creatinine, Ser: 0.96 mg/dL (ref 0.44–1.00)
GFR calc Af Amer: >60 mL/min (ref >60)
GFR calc Af Amer: >60 mL/min (ref >60)
GLUCOSE: 146 mg/dL — AB (ref 65–99)
Glucose, Bld: 145 mg/dL — ABNORMAL HIGH (ref 65–99)
POTASSIUM: 4.4 mmol/L (ref 3.5–5.1)
POTASSIUM: 4.6 mmol/L (ref 3.5–5.1)
Sodium: 140 mmol/L (ref 135–145)
Sodium: 144 mmol/L (ref 135–145)

## 2015-05-12 LAB — PHOSPHORUS: PHOSPHORUS: 3.6 mg/dL (ref 2.5–4.6)

## 2015-05-12 LAB — GLUCOSE, CAPILLARY
GLUCOSE-CAPILLARY: 109 mg/dL — AB (ref 65–99)
GLUCOSE-CAPILLARY: 135 mg/dL — AB (ref 65–99)

## 2015-05-12 LAB — MAGNESIUM
MAGNESIUM: 2.1 mg/dL (ref 1.7–2.4)
Magnesium: 1.7 mg/dL (ref 1.7–2.4)

## 2015-05-12 MED ORDER — VITAL HIGH PROTEIN PO LIQD: 1000.0000 mL | ORAL | Status: DC

## 2015-05-12 MED ORDER — ACETAMINOPHEN 160 MG/5ML PO SOLN
650.0000 mg | Freq: Four times a day (QID) | ORAL | Status: DC | PRN
  Administered 2015-05-12 – 2015-05-13 (×3): 650 mg
  Filled 2015-05-12 (×2): qty 20.3

## 2015-05-12 MED ORDER — METOCLOPRAMIDE HCL 5 MG/ML IJ SOLN
5.0000 mg | Freq: Four times a day (QID) | INTRAMUSCULAR | Status: AC
  Administered 2015-05-12 – 2015-05-13 (×4): 5 mg via INTRAVENOUS
  Filled 2015-05-12 (×4): qty 2

## 2015-05-12 MED ORDER — LEVOFLOXACIN IN D5W 750 MG/150ML IV SOLN
750.0000 mg | INTRAVENOUS | Status: DC
  Administered 2015-05-12 – 2015-05-14 (×3): 750 mg via INTRAVENOUS
  Filled 2015-05-12 (×3): qty 150

## 2015-05-12 MED ORDER — FENTANYL 100 MCG/HR TD PT72
100.0000 ug | MEDICATED_PATCH | TRANSDERMAL | Status: DC
  Administered 2015-05-12: 100 ug via TRANSDERMAL
  Filled 2015-05-12: qty 1

## 2015-05-12 MED ORDER — VITAL AF 1.2 CAL PO LIQD
1000.0000 mL | ORAL | Status: DC
  Filled 2015-05-12 (×2): qty 1000

## 2015-05-12 MED ORDER — VANCOMYCIN HCL IN DEXTROSE 1-5 GM/200ML-% IV SOLN
1000.0000 mg | Freq: Two times a day (BID) | INTRAVENOUS | Status: DC
  Administered 2015-05-12 – 2015-05-15 (×7): 1000 mg via INTRAVENOUS
  Filled 2015-05-12 (×7): qty 200

## 2015-05-12 NOTE — Progress Notes (Signed)
[facility] Progress Note Patient Name: Jenny Branch DOB: 1955/05/21 MRN: [id number]   Date of Service  05/12/2015  HPI/Events of Note  Fever to 103.5 F. Patient currently on Doxycycline. Patient is penicillin allergic.    eICU Interventions  Will order:  1. Blood cultures X 2 now. 2. Tracheal aspirate culture now.  3. Urine culture now.  4. Add Vancomycin and Levaquin per pharmacy consultation.      Intervention Category Major Interventions: Infection - evaluation and management  Sommer,Steven Eugene 05/12/2015, 7:19 PM

## 2015-05-12 NOTE — Progress Notes (Signed)
PULMONARY / CRITICAL CARE MEDICINE   Name: Jenny Branch MRN: [id number] DOB: 13-Jul-1955    ADMISSION DATE:  05/09/2015 CONSULTATION DATE:  05/09/15  REFERRING MD:  Dr. Doyle Askew   CHIEF COMPLAINT:  Respiratory Distress / AECOPD   BRIEF:  60 y/o female followed by BQ in the pulmonary office with COPD and ongoing tobacco use who was intubated on 3/9 for an AE of COPD.     SUBJECTIVE: Severe agitation overnight, on precedex and fentanyl.  VITAL SIGNS: BP 122/73 mmHg  Pulse 81  Temp(Src) 100.9 F (38.3 C) (Core (Comment))  Resp 19  Ht 5\' 3"  (1.6 m)  Wt 76 kg (167 lb 8.8 oz)  BMI 29.69 kg/m2  SpO2 100%  HEMODYNAMICS:    VENTILATOR SETTINGS: Vent Mode:  [-] PRVC FiO2 (%):  [40 %] 40 % Set Rate:  [18 bmp] 18 bmp Vt Set:  [420 mL] 420 mL PEEP:  [5 cmH20] 5 cmH20 Plateau Pressure:  [21 cmH20-31 cmH20] 31 cmH20  INTAKE / OUTPUT: I/O last 3 completed shifts: In: 5678.4 [I.V.:5288.4; Other:30; NG/GT:360] Out: [id number] [Urine:1720; Emesis/NG output:1450]  PHYSICAL EXAMINATION: General:  Sedated on vent, withdraws to pain. HENT: NCAT, ETT in place, PERRL PULM: even/non-labored, diminished bilaterally but good air movement, no wheeze CV: s1s2 rrr, no mgr GI: abdomen distended, minimal bowel sounds MSK: normal bulk and tone Derm: red all over, diaphoretic Neuro: Sedated on vent, MAE spontaneously  LABS:  BMET  Recent Labs Lab 05/10/15 0307 05/11/15 0309 05/12/15 0350  NA 131* 136 140  K 3.6 4.3 4.4  CL 89* 95* 101  CO2 29 27 30   BUN 19 33* 29*  CREATININE 1.03* 1.50* 0.96  GLUCOSE 154* 156* 146*    Electrolytes  Recent Labs Lab 05/10/15 0307 05/11/15 0309 05/12/15 0350  CALCIUM 9.1 7.9* 8.7*  MG 1.8  --  2.1  PHOS  --   --  3.6    CBC  Recent Labs Lab 05/10/15 0307 05/11/15 0309 05/12/15 0350  WBC 12.3* 12.8* 14.0*  HGB 15.4* 14.3 14.9  HCT 46.2* 44.2 46.0  PLT 332 256 277    Coag's No results for input(s): APTT, INR in the last 168  hours.  Sepsis Markers  Recent Labs Lab 05/09/15 1316  LATICACIDVEN 2.54*    ABG  Recent Labs Lab 05/09/15 1135 05/09/15 1754 05/09/15 2002  PHART 7.322* 7.317* 7.318*  PCO2ART 58.2* 63.2* 63.1*  PO2ART 267* 82.4 549*    Liver Enzymes No results for input(s): AST, ALT, ALKPHOS, BILITOT, ALBUMIN in the last 168 hours.  Cardiac Enzymes No results for input(s): TROPONINI, PROBNP in the last 168 hours.  Glucose No results for input(s): GLUCAP in the last 168 hours.  Imaging  3/9 CXR images personally reviewed >> ETT in place, no infiltrate, emphysema noted 3/8 KUB >> OG tube in stomach  STUDIES:    CULTURES: Flu PCR 3/8 nasal > neg Flu PCR 3/9 trach asp > neg  ANTIBIOTICS: Doxycycline 3/8 >>   SIGNIFICANT EVENTS: 3/08  Admit with COPD exacerbation, intubated 3/10  Intermittent agitation, concern for ETOH w/d on precedex   LINES/TUBES: ETT 3/8 >>  PIV  DISCUSSION: 60 y/o F, smoker, ETOH use admitted on 3/8 with concern for AECOPD requiring intubation.  Has worsening tachycardia, agitation on 3/9 consistent with alcohol withdrawal.    ASSESSMENT / PLAN:  PULMONARY A: Acute Hypercarbic Respiratory Failure  COPD Exacerbation Baseline GOLD D, FEV1 41% pred Tobacco Abuse > ongoing P:   PRVC support, 8cc/kg.  Continue solumedrol. Continue duoneb. VAP prevention. Intermittent CXR. Daily SBT / PSV as tolerated when mental status improves.  CARDIOVASCULAR A:  Tachycardia - in setting of COPD exacerbation, beta agonists and alcohol withdrawal, resolved.  P:  Tele monitoring. QTC 0.38, ok to use reglan.  RENAL A:   Hyponatremia > slightly better Hypokalemia > slightly better Anion Gap Acidosis - resolved AKI  Hematuria P:   KVO IVF Monitor BMET and UOP Replace electrolytes as needed SCD's and d/c heparin due to hematuria.  GASTROINTESTINAL A:   Abdominal distension> ileus vs SBO P:   Continue tube feedings Continue PPI for stress ulcer  prophylaxis Reglan  HEMATOLOGIC A:   Macrocytosis - ? ETOH related P:  Trend CBC  D/C heparin due to hematuria. SCD's.  INFECTIOUS A:   AECOPD  R/o Flu - sick greater than one week P:   No role Tamilflu given duration of symptoms Monitor fever curve/WBC  Doxycycline BID   ENDOCRINE A:   Hyperglycemia   P:   Monitor glucose   NEUROLOGIC A:   Acute encephalopathy due to ETOH withdrawal Anxiety  Severe vent dyssynchrony P:   RASS goal -2 Thiamine Folate Fentanyl gtt Continue Precedex PRN versed Resume home klonopin and clonidine.  FAMILY  - Updates: No family bedside.  - Inter-disciplinary family meet or Palliative Care meeting due by:  05/16/15  The patient is critically ill with multiple organ systems failure and requires high complexity decision making for assessment and support, frequent evaluation and titration of therapies, application of advanced monitoring technologies and extensive interpretation of multiple databases.   Critical Care Time devoted to patient care services described in this note is  35  Minutes. This time reflects time of care of this signee Dr [name]. This critical care time does not reflect procedure time, or teaching time or supervisory time of PA/NP/Med student/Med Resident etc but could involve care discussion time.  Rush Farmer, M.D. [facility] Pulmonary/Critical Care Medicine. Pager: [phone]. After hours pager: [phone].  05/12/2015, 10:39 AM

## 2015-05-12 NOTE — Progress Notes (Signed)
Brief Nutrition Note  Consult received for enteral/tube feeding initiation and management. RD not on campus today  Pt was seen by RD 2 days ago.  Reccommended TF regimen per that encounter was:  Initiate Vital AF 1.2 @ 20 ml/hr and increase by 10 ml every 4 hours to goal rate of 40 ml/hr.  30 ml Prostat BID.  Tube feeding regimen provides 1352 kcal (97% of needs), 102 grams of protein, and 779 ml of H2O.   Will place these recommendations and RD will F/U when able.  Adult Enteral Nutrition Protocol initiated.  Admitting Dx: Cough [R05] Hypokalemia [E87.6] Leukocytosis [D72.829] Respiratory distress [R06.00] SOB (shortness of breath) [R06.02] Tachycardia [R00.0] COPD exacerbation (HCC) [J44.1] Essential hypertension [I10]  Body mass index is 29.69 kg/(m^2). Pt meets criteria for overweight based on current BMI.  Labs:   Recent Labs Lab 05/10/15 0307 05/11/15 0309 05/12/15 0350  NA 131* 136 140  K 3.6 4.3 4.4  CL 89* 95* 101  CO2 29 27 30   BUN 19 33* 29*  CREATININE 1.03* 1.50* 0.96  CALCIUM 9.1 7.9* 8.7*  MG 1.8  --  2.1  PHOS  --   --  3.6  GLUCOSE 154* 156* 146*    Burtis Junes RD, LDN Nutrition Pager: [phone] 05/12/2015 12:23 PM

## 2015-05-12 NOTE — Progress Notes (Signed)
CSW received referral for COPD Gold protocol. Pt not appropriate as pt has only had 1 admission in the past 6 months. Inappropriate consult. CSW signing off.   Alison Murray, MSW, LCSW Clinical Social Work Weekend coverage  [phone]

## 2015-05-12 NOTE — Progress Notes (Signed)
Pharmacy Antibiotic Note  Jenny Branch is a 60 y.o. female admitted on 05/09/2015 with respiratory distress / AECOPD experiencing severe agitation concerning for EtOH withdrawal.  Pharmacy has been consulted for Vancomycin and Levaquin dosing for persistent fever and elevated WBC while on doxycycline therapy.  Note allergy to penicillin (rash).   Plan: Start vancomycin 1g IV q12h Levaquin 750mg  IV q24h Continue doxycycline 100mg  IV q12h F/u cultures, renal fxn, VT at Css as warranted, clinical course  Height: 5\' 3"  (160 cm) Weight: 167 lb 8.8 oz (76 kg) IBW/kg (Calculated) : 52.4  Temp (24hrs), Avg:101.1 F (38.4 C), Min:98.2 F (36.8 C), Max:103.5 F (39.7 C)   Recent Labs Lab 05/09/15 1116 05/09/15 1316 05/10/15 0307 05/11/15 0309 05/12/15 0350  WBC 16.6*  --  12.3* 12.8* 14.0*  CREATININE 0.77  --  1.03* 1.50* 0.96  LATICACIDVEN  --  2.54*  --   --   --     Estimated Creatinine Clearance: 63.1 mL/min (by C-G formula based on Cr of 0.96).    Allergies  Allergen Reactions  . Penicillins Itching and Rash    Antimicrobials this admission: 3/9 Doxycycline >> 3/11 Vancomcyin >>  3/11 Levaquin >>   Dose adjustments this admission:   Microbiology results: 3/11 BCx: ordered 3/8 MRSA PCR: negative  Thank you for allowing pharmacy to be a part of this patient's care.  Ralene Bathe, PharmD, BCPS 05/12/2015, 7:47 PM  Pager: [phone]

## 2015-05-12 NOTE — Progress Notes (Signed)
eLink Physician-Brief Progress Note Patient Name: CHARIZMA LINKO DOB: 1955-11-07 MRN: [id number]   Date of Service  05/12/2015  HPI/Events of Note  Request for bilateral wrist restraints.  eICU Interventions  Will order bilateral wrist restraints.      Intervention Category Minor Interventions: Agitation / anxiety - evaluation and management  Lysle Dingwall 05/12/2015, 4:38 PM

## 2015-05-12 NOTE — Progress Notes (Signed)
[place] Progress Note Patient Name: Jenny Branch DOB: 1955/07/08 MRN: [id number]   Date of Service  05/12/2015  HPI/Events of Note  Frequent PVC's.   eICU Interventions  Will order BMP and Mg++ level now.      Intervention Category Intermediate Interventions: Arrhythmia - evaluation and management  [name] 05/12/2015, 9:23 PM

## 2015-05-13 ENCOUNTER — Inpatient Hospital Stay (HOSPITAL_COMMUNITY): Payer: Medicare Other

## 2015-05-13 DIAGNOSIS — N179 Acute kidney failure, unspecified: Secondary | ICD-10-CM

## 2015-05-13 LAB — BLOOD GAS, ARTERIAL
ACID-BASE EXCESS: 3.4 mmol/L — AB (ref 0.0–2.0)
Bicarbonate: 29.4 mEq/L — ABNORMAL HIGH (ref 20.0–24.0)
DRAWN BY: 308601
FIO2: 0.4
LHR: 18 {breaths}/min
MECHVT: 420 mL
O2 Saturation: 98.5 %
PATIENT TEMPERATURE: 39.2
PCO2 ART: 58.7 mmHg — AB (ref 35.0–45.0)
PEEP/CPAP: 5 cmH2O
PO2 ART: 137 mmHg — AB (ref 80.0–100.0)
TCO2: 26 mmol/L (ref 0–100)
pH, Arterial: 7.334 — ABNORMAL LOW (ref 7.350–7.450)

## 2015-05-13 LAB — CBC WITH DIFFERENTIAL/PLATELET
BASOS PCT: 0 %
Basophils Absolute: 0 10*3/uL (ref 0.0–0.1)
EOS ABS: 0 10*3/uL (ref 0.0–0.7)
EOS PCT: 0 %
HEMATOCRIT: 43.2 % (ref 36.0–46.0)
Hemoglobin: 14.6 g/dL (ref 12.0–15.0)
Lymphocytes Relative: 11 %
Lymphs Abs: 1.3 10*3/uL (ref 0.7–4.0)
MCH: 36 pg — AB (ref 26.0–34.0)
MCHC: 33.8 g/dL (ref 30.0–36.0)
MCV: 106.7 fL — AB (ref 78.0–100.0)
MONO ABS: 1.2 10*3/uL — AB (ref 0.1–1.0)
Monocytes Relative: 10 %
NEUTROS ABS: 9.6 10*3/uL — AB (ref 1.7–7.7)
Neutrophils Relative %: 79 %
PLATELETS: 251 10*3/uL (ref 150–400)
RBC: 4.05 MIL/uL (ref 3.87–5.11)
RDW: 14.8 % (ref 11.5–15.5)
WBC: 12.1 10*3/uL — ABNORMAL HIGH (ref 4.0–10.5)

## 2015-05-13 LAB — BASIC METABOLIC PANEL
ANION GAP: 9 (ref 5–15)
BUN: 22 mg/dL — AB (ref 6–20)
CALCIUM: 8.7 mg/dL — AB (ref 8.9–10.3)
CO2: 30 mmol/L (ref 22–32)
CREATININE: 0.85 mg/dL (ref 0.44–1.00)
Chloride: 104 mmol/L (ref 101–111)
GFR calc Af Amer: >60 mL/min (ref >60)
GFR calc non Af Amer: >60 mL/min (ref >60)
GLUCOSE: 110 mg/dL — AB (ref 65–99)
POTASSIUM: 4.2 mmol/L (ref 3.5–5.1)
Sodium: 143 mmol/L (ref 135–145)

## 2015-05-13 LAB — GLUCOSE, CAPILLARY
GLUCOSE-CAPILLARY: 139 mg/dL — AB (ref 65–99)
GLUCOSE-CAPILLARY: 195 mg/dL — AB (ref 65–99)
Glucose-Capillary: 112 mg/dL — ABNORMAL HIGH (ref 65–99)
Glucose-Capillary: 152 mg/dL — ABNORMAL HIGH (ref 65–99)
Glucose-Capillary: 139 mg/dL — ABNORMAL HIGH (ref 65–99)
Glucose-Capillary: 134 mg/dL — ABNORMAL HIGH (ref 65–99)

## 2015-05-13 LAB — PHOSPHORUS: PHOSPHORUS: 2.5 mg/dL (ref 2.5–4.6)

## 2015-05-13 LAB — MAGNESIUM: MAGNESIUM: 1.6 mg/dL — AB (ref 1.7–2.4)

## 2015-05-13 MED ORDER — FOLIC ACID 5 MG/ML IJ SOLN
1.0000 mg | Freq: Every day | INTRAMUSCULAR | Status: DC
  Administered 2015-05-13 – 2015-05-24 (×12): 1 mg via INTRAVENOUS
  Filled 2015-05-13 (×15): qty 0.2

## 2015-05-13 MED ORDER — THIAMINE HCL 100 MG/ML IJ SOLN
100.0000 mg | Freq: Every day | INTRAMUSCULAR | Status: DC
  Administered 2015-05-14 – 2015-05-24 (×11): 100 mg via INTRAVENOUS
  Filled 2015-05-13 (×11): qty 2

## 2015-05-13 MED ORDER — VITAL 1.5 CAL PO LIQD
1000.0000 mL | ORAL | Status: DC
  Administered 2015-05-13: 1000 mL
  Filled 2015-05-13 (×2): qty 1000

## 2015-05-13 MED ORDER — PRO-STAT SUGAR FREE PO LIQD
30.0000 mL | Freq: Three times a day (TID) | ORAL | Status: DC
  Administered 2015-05-13 (×2): 30 mL
  Filled 2015-05-13 (×3): qty 30

## 2015-05-13 MED ORDER — ACETAMINOPHEN 650 MG RE SUPP
650.0000 mg | Freq: Four times a day (QID) | RECTAL | Status: DC | PRN
  Administered 2015-05-13 – 2015-05-15 (×3): 650 mg via RECTAL
  Filled 2015-05-13 (×3): qty 1

## 2015-05-13 MED ORDER — METOCLOPRAMIDE HCL 5 MG/ML IJ SOLN
10.0000 mg | Freq: Four times a day (QID) | INTRAMUSCULAR | Status: AC
  Administered 2015-05-13 – 2015-05-14 (×4): 10 mg via INTRAVENOUS
  Filled 2015-05-13 (×4): qty 2

## 2015-05-13 NOTE — Progress Notes (Signed)
Nutrition Follow-up  INTERVENTION:   Change formula to Vital 1.5, initiate @ 40 ml/hr and increase by 10 ml every 4 hours to goal rate of 45 ml/hr.  30 ml Prostat TID.  Tube feeding regimen provides 1920 kcal (97% of needs), 118 grams of protein, and 825 ml of H2O.   RD to continue to monitor  NUTRITION DIAGNOSIS:   Inadequate oral intake related to inability to eat as evidenced by NPO status.  Ongoing.  GOAL:   Patient will meet greater than or equal to 90% of their needs  Not meeting.  MONITOR:   Vent status, Labs, Weight trends, I & O's  REASON FOR ASSESSMENT:   Consult Enteral/tube feeding initiation and management  ASSESSMENT:   60 y.o. female with known COPD, asthma, HTN, depression, and PTSD, who presented to [facility] ED with main concern of several days duration of progressively worsening dyspnea, that initially started with exertion and has progressed to dyspnea at rest in the past 24 hours, this has been associated with mixed episodes of non productive and productive cough of white sputum, chest tightness that occurs with coughing spells, malaise and poor oral intake.  Needs re-estimated, TF order adjusted to better meet needs.  Patient is currently intubated on ventilator support MV: 8 L/min Temp (24hrs), Avg:103.3 F (39.6 C), Min:101.8 F (38.8 C), Max:104.2 F (40.1 C)  Medications reviewed. Labs reviewed: CBGs: 112-152 Low Mg  Diet Order:  Diet NPO time specified  Skin:  Reviewed, no issues  Last BM:  PTA  Height:   Ht Readings from Last 1 Encounters:  05/09/15 5\' 3"  (1.6 m)    Weight:   Wt Readings from Last 1 Encounters:  05/13/15 164 lb 7.4 oz (74.6 kg)    Ideal Body Weight:  52.3 kg  BMI:  Body mass index is 29.14 kg/(m^2).  Estimated Nutritional Needs:   Kcal:  1984  Protein:  105-115g  Fluid:  2L/day  EDUCATION NEEDS:   No education needs identified at this time  Clayton Bibles, MS, RD, LDN Pager: [phone] After  Hours Pager: [phone]

## 2015-05-13 NOTE — Progress Notes (Signed)
PULMONARY / CRITICAL CARE MEDICINE   Name: Jenny Branch MRN: [id number] DOB: [birth year]    ADMISSION DATE:  05/09/2015 CONSULTATION DATE:  05/09/15  REFERRING MD:  Dr. Doyle Askew   CHIEF COMPLAINT:  Respiratory Distress / AECOPD   BRIEF:  60 y/o female followed by BQ in the pulmonary office with COPD and ongoing tobacco use who was intubated on 3/9 for an AE of COPD.     SUBJECTIVE: Febrile overnight and vanc added.  VITAL SIGNS: BP 136/78 mmHg  Pulse 95  Temp(Src) 104.2 F (40.1 C) (Core (Comment))  Resp 13  Ht 5\' 3"  (1.6 m)  Wt 74.6 kg (164 lb 7.4 oz)  BMI 29.14 kg/m2  SpO2 100%  HEMODYNAMICS:    VENTILATOR SETTINGS: Vent Mode:  [-] PRVC FiO2 (%):  [40 %] 40 % Set Rate:  [18 bmp] 18 bmp Vt Set:  [420 mL] 420 mL PEEP:  [5 cmH20] 5 cmH20 Plateau Pressure:  [17 cmH20-28 cmH20] 17 cmH20  INTAKE / OUTPUT: I/O last 3 completed shifts: In: 6176.6 [I.V.:4946.6; Other:30; NG/GT:850; IV Piggyback:350] Out: [id number]; Emesis/NG output:1950]  PHYSICAL EXAMINATION: General:  Sedated on vent, withdraws to pain. HENT: NCAT, ETT in place, PERRL PULM: even/non-labored, diminished bilaterally but good air movement, no wheeze CV: s1s2 rrr, no mgr GI: abdomen distended, minimal bowel sounds MSK: normal bulk and tone Derm: red all over, diaphoretic Neuro: Sedated on vent, MAE spontaneously  LABS:  BMET  Recent Labs Lab 05/12/15 0350 05/12/15 2205 05/13/15 0416  NA 140 144 143  K 4.4 4.6 4.2  CL 101 104 104  CO2 30 30 30   BUN 29* 23* 22*  CREATININE 0.96 0.81 0.85  GLUCOSE 146* 145* 110*   Electrolytes  Recent Labs Lab 05/12/15 0350 05/12/15 2205 05/13/15 0416  CALCIUM 8.7* 8.4* 8.7*  MG 2.1 1.7 1.6*  PHOS 3.6  --  2.5   CBC  Recent Labs Lab 05/11/15 0309 05/12/15 0350 05/13/15 0416  WBC 12.8* 14.0* 12.1*  HGB 14.3 14.9 14.6  HCT 44.2 46.0 43.2  PLT 256 277 251   Coag's No results for input(s): APTT, INR in the last 168 hours.  Sepsis  Markers  Recent Labs Lab 05/09/15 1316  LATICACIDVEN 2.54*   ABG  Recent Labs Lab 05/09/15 1754 05/09/15 2002 05/13/15 0305  PHART 7.317* 7.318* 7.334*  PCO2ART 63.2* 63.1* 58.7*  PO2ART 82.4 549* 137*   Liver Enzymes No results for input(s): AST, ALT, ALKPHOS, BILITOT, ALBUMIN in the last 168 hours.  Cardiac Enzymes No results for input(s): TROPONINI, PROBNP in the last 168 hours.  Glucose  Recent Labs Lab 05/12/15 1600 05/12/15 2007 05/12/15 2352 05/13/15 0437 05/13/15 0738  GLUCAP 109* 135* 139* 112* 152*   Imaging  3/9 CXR images personally reviewed >> ETT in place, no infiltrate, emphysema noted 3/8 KUB >> OG tube in stomach  STUDIES:    CULTURES: Flu PCR 3/8 nasal > neg Flu PCR 3/9 trach asp > neg  ANTIBIOTICS: Doxycycline 3/8 >>   SIGNIFICANT EVENTS: 3/08  Admit with COPD exacerbation, intubated 3/10  Intermittent agitation, concern for ETOH w/d on precedex   LINES/TUBES: ETT 3/8 >>  PIV  DISCUSSION: 60 y/o F, smoker, ETOH use admitted on 3/8 with concern for AECOPD requiring intubation.  Has worsening tachycardia, agitation on 3/9 consistent with alcohol withdrawal.    ASSESSMENT / PLAN:  PULMONARY A: Acute Hypercarbic Respiratory Failure  COPD Exacerbation Baseline GOLD D, FEV1 41% pred Tobacco Abuse > ongoing P:  PRVC support, 8cc/kg. Continue solumedrol. Continue duoneb. VAP prevention. Intermittent CXR. Daily SBT / PSV as tolerated when mental status improves.  CARDIOVASCULAR A:  Tachycardia - in setting of COPD exacerbation, beta agonists and alcohol withdrawal, resolved.  P:  Tele monitoring. QTC 0.38, ok to use reglan.  RENAL A:   Hyponatremia > slightly better Hypokalemia > slightly better Anion Gap Acidosis - resolved AKI  Hematuria P:   KVO IVF Monitor BMET and UOP Replace electrolytes as needed SCD's and d/c heparin due to hematuria.  GASTROINTESTINAL A:   Abdominal distension> ileus vs SBO P:    Restart tube feedings at noon. Continue PPI for stress ulcer prophylaxis. Reglan.  HEMATOLOGIC A:   Macrocytosis - ? ETOH related P:  Trend CBC. D/C heparin due to hematuria. SCD's.  INFECTIOUS A:   AECOPD  Febrile overnight. Ruled out for flu. P:   No role Tamilflu given duration of symptoms Monitor fever curve/WBC  Doxycycline BID  Add vancomycin. F/U on culture.  ENDOCRINE A:   Hyperglycemia   P:   Monitor glucose   NEUROLOGIC A:   Acute encephalopathy due to ETOH withdrawal Anxiety  Severe vent dyssynchrony P:   RASS goal -2 Thiamine Folate Fentanyl gtt Continue Precedex PRN versed Resume home klonopin and clonidine.  FAMILY  - Updates: No family bedside.  - Inter-disciplinary family meet or Palliative Care meeting due by:  05/16/15  The patient is critically ill with multiple organ systems failure and requires high complexity decision making for assessment and support, frequent evaluation and titration of therapies, application of advanced monitoring technologies and extensive interpretation of multiple databases.   Critical Care Time devoted to patient care services described in this note is  35  Minutes. This time reflects time of care of this signee Dr [name]. This critical care time does not reflect procedure time, or teaching time or supervisory time of PA/NP/Med student/Med Resident etc but could involve care discussion time.  Rush Farmer, M.D. [facility] Pulmonary/Critical Care Medicine. Pager: [phone]. After hours pager: [phone].  05/13/2015, 11:19 AM

## 2015-05-14 ENCOUNTER — Inpatient Hospital Stay (HOSPITAL_COMMUNITY): Payer: Medicare Other

## 2015-05-14 DIAGNOSIS — Z720 Tobacco use: Secondary | ICD-10-CM

## 2015-05-14 DIAGNOSIS — G9340 Encephalopathy, unspecified: Secondary | ICD-10-CM

## 2015-05-14 LAB — BLOOD GAS, ARTERIAL
Acid-Base Excess: 2.3 mmol/L — ABNORMAL HIGH (ref 0.0–2.0)
Bicarbonate: 29.1 mEq/L — ABNORMAL HIGH (ref 20.0–24.0)
Drawn by: 232811
FIO2: 0.4
MECHVT: 420 mL
O2 SAT: 98.5 %
PATIENT TEMPERATURE: 99.2
PCO2 ART: 57.7 mmHg — AB (ref 35.0–45.0)
PEEP: 5 cmH2O
PH ART: 7.325 — AB (ref 7.350–7.450)
PO2 ART: 130 mmHg — AB (ref 80.0–100.0)
RATE: 18 resp/min
TCO2: 26 mmol/L (ref 0–100)

## 2015-05-14 LAB — GLUCOSE, CAPILLARY
GLUCOSE-CAPILLARY: 163 mg/dL — AB (ref 65–99)
GLUCOSE-CAPILLARY: 126 mg/dL — AB (ref 65–99)
Glucose-Capillary: 149 mg/dL — ABNORMAL HIGH (ref 65–99)
Glucose-Capillary: 151 mg/dL — ABNORMAL HIGH (ref 65–99)
Glucose-Capillary: 154 mg/dL — ABNORMAL HIGH (ref 65–99)
Glucose-Capillary: 114 mg/dL — ABNORMAL HIGH (ref 65–99)

## 2015-05-14 LAB — CBC WITH DIFFERENTIAL/PLATELET
Basophils Absolute: 0 10*3/uL (ref 0.0–0.1)
Basophils Relative: 0 %
EOS PCT: 0 %
Eosinophils Absolute: 0 10*3/uL (ref 0.0–0.7)
HCT: 43.1 % (ref 36.0–46.0)
Hemoglobin: 13.9 g/dL (ref 12.0–15.0)
LYMPHS ABS: 1.3 10*3/uL (ref 0.7–4.0)
Lymphocytes Relative: 12 %
MCH: 35.6 pg — AB (ref 26.0–34.0)
MCHC: 32.3 g/dL (ref 30.0–36.0)
MCV: 110.5 fL — AB (ref 78.0–100.0)
MONO ABS: 0.2 10*3/uL (ref 0.1–1.0)
Monocytes Relative: 2 %
Neutro Abs: 9.3 10*3/uL — ABNORMAL HIGH (ref 1.7–7.7)
Neutrophils Relative %: 86 %
PLATELETS: 221 10*3/uL (ref 150–400)
RBC: 3.9 MIL/uL (ref 3.87–5.11)
RDW: 14.9 % (ref 11.5–15.5)
WBC: 10.8 10*3/uL — AB (ref 4.0–10.5)

## 2015-05-14 LAB — BASIC METABOLIC PANEL
Anion gap: 8 (ref 5–15)
BUN: 20 mg/dL (ref 6–20)
CALCIUM: 8.6 mg/dL — AB (ref 8.9–10.3)
CHLORIDE: 102 mmol/L (ref 101–111)
CO2: 30 mmol/L (ref 22–32)
CREATININE: 0.79 mg/dL (ref 0.44–1.00)
GFR calc Af Amer: >60 mL/min (ref >60)
GFR calc non Af Amer: >60 mL/min (ref >60)
GLUCOSE: 142 mg/dL — AB (ref 65–99)
Potassium: 4.1 mmol/L (ref 3.5–5.1)
Sodium: 140 mmol/L (ref 135–145)

## 2015-05-14 LAB — MAGNESIUM: MAGNESIUM: 1.5 mg/dL — AB (ref 1.7–2.4)

## 2015-05-14 LAB — PHOSPHORUS: Phosphorus: 3.1 mg/dL (ref 2.5–4.6)

## 2015-05-14 MED ORDER — CLONIDINE HCL 0.1 MG/24HR TD PTWK
0.1000 mg | MEDICATED_PATCH | TRANSDERMAL | Status: DC
  Administered 2015-05-14 – 2015-05-21 (×2): 0.1 mg via TRANSDERMAL
  Filled 2015-05-14 (×3): qty 1

## 2015-05-14 MED ORDER — MAGNESIUM SULFATE 4 GM/100ML IV SOLN
4.0000 g | Freq: Once | INTRAVENOUS | Status: AC
  Administered 2015-05-14: 4 g via INTRAVENOUS
  Filled 2015-05-14: qty 100

## 2015-05-14 MED ORDER — METHYLPREDNISOLONE SODIUM SUCC 125 MG IJ SOLR
60.0000 mg | Freq: Every day | INTRAMUSCULAR | Status: DC
  Administered 2015-05-15 – 2015-05-16 (×2): 60 mg via INTRAVENOUS
  Filled 2015-05-14 (×2): qty 2

## 2015-05-14 MED ORDER — NICOTINE 14 MG/24HR TD PT24
14.0000 mg | MEDICATED_PATCH | Freq: Every day | TRANSDERMAL | Status: DC
  Administered 2015-05-14 – 2015-05-22 (×9): 14 mg via TRANSDERMAL
  Filled 2015-05-14 (×9): qty 1

## 2015-05-14 NOTE — Progress Notes (Signed)
Fentanyl patch removed from patient's left arm. Per MD. Order discontinued

## 2015-05-14 NOTE — Progress Notes (Signed)
CRITICAL VALUE ALERT  Critical value received:  Positive blood cultures gram + cocci in clusters in pink bottle  Date of notification:  05/14/15  Time of notification:  [id number]  Critical value read back:Yes.    Nurse who received alert:  Darrin Nipper, RN  MD notified (1st page):  Warren Lacy

## 2015-05-14 NOTE — Progress Notes (Addendum)
PULMONARY / CRITICAL CARE MEDICINE   Name: Jenny Branch MRN: [id number] DOB: March 26, 1955    ADMISSION DATE:  05/09/2015 CONSULTATION DATE:  05/09/15  REFERRING MD:  Dr. Doyle Askew   CHIEF COMPLAINT:  Respiratory Distress / AECOPD   BRIEF:  60 y/o female followed by BQ in the pulmonary office with COPD and ongoing tobacco use who was intubated on 3/9 for an AE of COPD.     SUBJECTIVE: No acute events overnight. Patient had magnesium replaced this morning. Weaned fentanyl to off this morning with improving mentation per bedside nurse. She reportedly still having spells of apnea with increasing heart rate off of fentanyl.  REVIEW OF SYSTEMS:  Unable to obtain given sedation.  VITAL SIGNS: BP 108/71 mmHg  Pulse 71  Temp(Src) 99.9 F (37.7 C) (Core (Comment))  Resp 18  Ht 5\' 3"  (1.6 m)  Wt 77.6 kg (171 lb 1.2 oz)  BMI 30.31 kg/m2  SpO2 100%  HEMODYNAMICS:    VENTILATOR SETTINGS: Vent Mode:  [-] PRVC FiO2 (%):  [40 %] 40 % Set Rate:  [18 bmp] 18 bmp Vt Set:  [420 mL] 420 mL PEEP:  [5 cmH20] 5 cmH20 Plateau Pressure:  [17 cmH20-23 cmH20] 18 cmH20  INTAKE / OUTPUT: I/O last 3 completed shifts: In: 5045.2 [I.V.:3015.2; NG/GT:1030; IV Piggyback:1000] Out: [id number] [Urine:2650; Emesis/NG output:720]  PHYSICAL EXAMINATION: General:  Awake. No acute distress. Remains intubated.  Integument:  Warm & dry. No rash on exposed skin. Some skin flushing. HEENT:  No scleral injection or icterus. Endotracheal tube in place.  Cardiovascular:  Tachycardic. No edema. No appreciable JVD.  Pulmonary:  Faint end expiratory wheeze. Symmetric chest wall rise on ventilator. Abdomen: Soft. Hypoactive bowel sounds. Protuberant. Neurological: Patient is showing me 2 fingers on the appropriate hand. She is moving her head side to side and appears uncomfortable. Not following commands otherwise. Spontaneously moving all 4 extremities.    LABS:  BMET  Recent Labs Lab 05/12/15 2205 05/13/15 0416  05/14/15 0319  NA 144 143 140  K 4.6 4.2 4.1  CL 104 104 102  CO2 30 30 30   BUN 23* 22* 20  CREATININE 0.81 0.85 0.79  GLUCOSE 145* 110* 142*   Electrolytes  Recent Labs Lab 05/12/15 0350 05/12/15 2205 05/13/15 0416 05/14/15 0319  CALCIUM 8.7* 8.4* 8.7* 8.6*  MG 2.1 1.7 1.6* 1.5*  PHOS 3.6  --  2.5 3.1   CBC  Recent Labs Lab 05/12/15 0350 05/13/15 0416 05/14/15 0319  WBC 14.0* 12.1* 10.8*  HGB 14.9 14.6 13.9  HCT 46.0 43.2 43.1  PLT 277 251 221   Coag's No results for input(s): APTT, INR in the last 168 hours.  Sepsis Markers  Recent Labs Lab 05/09/15 1316  LATICACIDVEN 2.54*   ABG  Recent Labs Lab 05/09/15 2002 05/13/15 0305 05/14/15 0420  PHART 7.318* 7.334* 7.325*  PCO2ART 63.1* 58.7* 57.7*  PO2ART 549* 137* 130*   Liver Enzymes No results for input(s): AST, ALT, ALKPHOS, BILITOT, ALBUMIN in the last 168 hours.  Cardiac Enzymes No results for input(s): TROPONINI, PROBNP in the last 168 hours.  Glucose  Recent Labs Lab 05/13/15 1234 05/13/15 1713 05/13/15 2017 05/13/15 2359 05/14/15 0416 05/14/15 0758  GLUCAP 139* 134* 195* 154* 149* 151*   STUDIES:  3/9 CXR images personally reviewed: ETT in place, no infiltrate, emphysema noted 3/8 KUB: OG tube in stomach KUB 3/12:  Mild gaseous distension of right & transverse colon. No obvious obstruction. Port CXR 3/13:  ETT 3.5 cm  above carina. No focal opacity or effusion appreciated. Heart normal in size.  MICROBIOLOGY: Blood Ctx x2 3/11>>> Tracheal Asp Ctx 3/11>>> Urine Ctx 3/11>>> Flu PCR 3/8 nasal: neg Flu PCR 3/9 trach asp: neg  ANTIBIOTICS: Vancomycin 3/11>>> Levaquin 3/11>>> Doxycycline 3/8 - 3/11   SIGNIFICANT EVENTS: 3/08  Admit with COPD exacerbation, intubated 3/10  Intermittent agitation, concern for ETOH w/d on precedex   LINES/TUBES: OETT 7.5 3/8 >>  OGT 3/8>> Foley 3/8>> PIV  DISCUSSION: 60 y/o F, smoker, ETOH use admitted on 3/8 with concern for AECOPD  requiring intubation.  Has worsening tachycardia, agitation on 3/9 consistent with alcohol withdrawal.    ASSESSMENT / PLAN:  PULMONARY A: Acute on Chronic Hypercarbic Respiratory Failure COPD Exacerbation Baseline GOLD D, FEV1 41% pred Ongoing Tobacco Use  P:   PRVC support, 8cc/kg. Change Solu-Medrol to IV daily Continue Duoneb q6hr VAP prevention. Intermittent CXR. Daily SBT / PSV as tolerated when mental status improves Nicotine Patch 14mg /24hr  CARDIOVASCULAR A:  Tachycardia - Likely secondary to withdrawal & beta agonists.   P:  Tele monitoring. Vitals per unit protocol EKG in AM Catapres TTS-1 Patch  RENAL A:   Hypomagnesemia - Replacing. Hyponatremia - Resolved Hypokalemia - Resolved. Anion Gap Acidosis - Resolved. Acute Renal Failure - Resolved. Hematuria - No gross blood.  P:   KVO IVF Trending renal function daily w/ BUN/Creatinine Electrolyte panel daily Replace electrolytes as needed  GASTROINTESTINAL A:   Abdominal distension - ileus vs SBO  P:   Holding tube Feedings Tube to lower intermittent suction Protonix IV daily No further Reglan  HEMATOLOGIC A:   Macrocytosis - Possibly secondary to EtOH. Leukocytosis - Resolved.  P:  Trending cell counts daily w/ CBC Heparin off due to hematuria SCD's.  INFECTIOUS A:   Acute Bronchitis  P:   Waiting final culture results. D/C Doxycycline Day #3 Levaquin & Vancomcyin  ENDOCRINE A:   Hyperglycemia  - Improved.  P:   Monitor glucose   NEUROLOGIC A:   Acute encephalopathy - Likely due to ETOH withdrawal. Sedation on Ventilator H/O Anxiety  P:   RASS Goal:  0 to -1 Thiamine & Folic Acid IV daily D/C Fentanyl Duragesic Fentanyl gtt Precedex gtt PRN versed D/C Clonidine 0.1mg  bid D/C Klonopin 0.25mg  tid D/C Paxil 10mg  daily Catapres TTS-1 Patch  FAMILY  - Updates: No family bedside.  - Inter-disciplinary family meet or Palliative Care meeting due by:   05/16/15  TODAY'S SUMMARY: 60 year old female intubated for respiratory failure. Acute exacerbation of COPD as well as evidence of ongoing problems with withdrawal. Known history of tobacco use. Patient has not yet had a bowel movement. Discontinuing tube feedings as well as medicines via her feeding tube. Placing feeding tube to low intermittent suction. Initiating Catapres patch as well as nicotine patch. Discontinuing fentanyl patch. I suspect patient's periods of apnea were likely due to excessive use of fentanyl. Checking EKG in the morning and considering Haldol VS Seroquel at bedtime depending upon patient's mental status.   I have spent a total of 37 minutes of critical care time this morning caring for the patient, and reviewing the patient's electronic medical record.  Sonia Baller Ashok Cordia, M.D. [facility] Pulmonary & Critical Care Pager:  [phone] After 3pm or if no response, call [phone] 05/14/2015, 8:47 AM

## 2015-05-14 NOTE — Progress Notes (Signed)
[place] Progress Note Patient Name: Jenny Branch DOB: 10-Oct-1955 MRN: [id number]    [place] Physician Progress Note and Electrolyte Replacement  Patient Name: Jenny Branch DOB: 09-06-1955 MRN: [id number]  Date of Service  05/14/2015   HPI/Events of Note    Recent Labs Lab 05/10/15 0307 05/11/15 0309 05/12/15 0350 05/12/15 2205 05/13/15 0416 05/14/15 0319  NA 131* 136 140 144 143 140  K 3.6 4.3 4.4 4.6 4.2 4.1  CL 89* 95* 101 104 104 102  CO2 29 27 30 30 30 30   GLUCOSE 154* 156* 146* 145* 110* 142*  BUN 19 33* 29* 23* 22* 20  CREATININE 1.03* 1.50* 0.96 0.81 0.85 0.79  CALCIUM 9.1 7.9* 8.7* 8.4* 8.7* 8.6*  MG 1.8  --  2.1 1.7 1.6* 1.5*  PHOS  --   --  3.6  --  2.5 3.1    Estimated Creatinine Clearance: 76.6 mL/min (by C-G formula based on Cr of 0.79).  Intake/Output      03/12 0701 - 03/13 0700   I.V. (mL/kg) 1978.3 (25.5)   NG/GT 650   IV Piggyback 550   Total Intake(mL/kg) 3178.3 (41)   Urine (mL/kg/hr) 1650 (0.9)   Emesis/NG output 520 (0.3)   Total Output 2170   Net +1008.3        - I/O DETAILED x 24h    Total I/O In: 946.7 [I.V.:461.7; NG/GT:135; IV Piggyback:350] Out: 650 [Urine:650] - I/O THIS SHIFT    ASSESSMENT hypomagnesemia  eICURN Interventions  4gm mag sulfate   ASSESSMENT: MAJOR ELECTROLYTE      Dr. Brand Males, M.D., [facility].C.P Pulmonary and Critical Care Medicine Staff Physician [place] Pulmonary and Critical Care Pager: [phone], If no answer or between  15:00h - 7:00h: call [phone]  05/14/2015 4:52 AM      Intervention Category Intermediate Interventions: Electrolyte abnormality - evaluation and management  Jeffren Dombek 05/14/2015, 4:52 AM

## 2015-05-15 DIAGNOSIS — K567 Ileus, unspecified: Secondary | ICD-10-CM

## 2015-05-15 DIAGNOSIS — D72829 Elevated white blood cell count, unspecified: Secondary | ICD-10-CM

## 2015-05-15 LAB — BASIC METABOLIC PANEL
Anion gap: 9 (ref 5–15)
BUN: 17 mg/dL (ref 6–20)
CALCIUM: 8.8 mg/dL — AB (ref 8.9–10.3)
CO2: 30 mmol/L (ref 22–32)
Chloride: 102 mmol/L (ref 101–111)
Creatinine, Ser: 0.65 mg/dL (ref 0.44–1.00)
GFR calc Af Amer: >60 mL/min (ref >60)
GLUCOSE: 114 mg/dL — AB (ref 65–99)
Potassium: 3.9 mmol/L (ref 3.5–5.1)
Sodium: 141 mmol/L (ref 135–145)

## 2015-05-15 LAB — RENAL FUNCTION PANEL
ALBUMIN: 2.7 g/dL — AB (ref 3.5–5.0)
Anion gap: 9 (ref 5–15)
BUN: 18 mg/dL (ref 6–20)
CALCIUM: 8.8 mg/dL — AB (ref 8.9–10.3)
CO2: 29 mmol/L (ref 22–32)
CREATININE: 0.62 mg/dL (ref 0.44–1.00)
Chloride: 106 mmol/L (ref 101–111)
Glucose, Bld: 104 mg/dL — ABNORMAL HIGH (ref 65–99)
PHOSPHORUS: 2.6 mg/dL (ref 2.5–4.6)
Potassium: 3.6 mmol/L (ref 3.5–5.1)
SODIUM: 144 mmol/L (ref 135–145)

## 2015-05-15 LAB — CBC WITH DIFFERENTIAL/PLATELET
BASOS ABS: 0 10*3/uL (ref 0.0–0.1)
BASOS PCT: 0 %
EOS ABS: 0 10*3/uL (ref 0.0–0.7)
EOS PCT: 0 %
HCT: 40.8 % (ref 36.0–46.0)
Hemoglobin: 13.6 g/dL (ref 12.0–15.0)
Lymphocytes Relative: 12 %
Lymphs Abs: 1.7 10*3/uL (ref 0.7–4.0)
MCH: 35.2 pg — ABNORMAL HIGH (ref 26.0–34.0)
MCHC: 33.3 g/dL (ref 30.0–36.0)
MCV: 105.7 fL — ABNORMAL HIGH (ref 78.0–100.0)
Monocytes Absolute: 1 10*3/uL (ref 0.1–1.0)
Monocytes Relative: 7 %
NEUTROS PCT: 81 %
Neutro Abs: 11.5 10*3/uL — ABNORMAL HIGH (ref 1.7–7.7)
PLATELETS: 212 10*3/uL (ref 150–400)
RBC: 3.86 MIL/uL — AB (ref 3.87–5.11)
RDW: 14.8 % (ref 11.5–15.5)
WBC: 14.1 10*3/uL — AB (ref 4.0–10.5)

## 2015-05-15 LAB — GLUCOSE, CAPILLARY
GLUCOSE-CAPILLARY: 101 mg/dL — AB (ref 65–99)
GLUCOSE-CAPILLARY: 113 mg/dL — AB (ref 65–99)
GLUCOSE-CAPILLARY: 89 mg/dL (ref 65–99)
Glucose-Capillary: 101 mg/dL — ABNORMAL HIGH (ref 65–99)
Glucose-Capillary: 93 mg/dL (ref 65–99)
Glucose-Capillary: 107 mg/dL — ABNORMAL HIGH (ref 65–99)

## 2015-05-15 LAB — VANCOMYCIN, TROUGH: VANCOMYCIN TR: 13 ug/mL (ref 10.0–20.0)

## 2015-05-15 LAB — URINE CULTURE
Culture: 50000
Special Requests: NORMAL

## 2015-05-15 LAB — MAGNESIUM
MAGNESIUM: 1.7 mg/dL (ref 1.7–2.4)
Magnesium: 1.7 mg/dL (ref 1.7–2.4)

## 2015-05-15 MED ORDER — VITAMINS A & D EX OINT
TOPICAL_OINTMENT | CUTANEOUS | Status: AC
  Filled 2015-05-15: qty 5

## 2015-05-15 MED ORDER — POTASSIUM CHLORIDE 10 MEQ/100ML IV SOLN
10.0000 meq | INTRAVENOUS | Status: AC
  Administered 2015-05-15 (×4): 10 meq via INTRAVENOUS
  Filled 2015-05-15 (×4): qty 100

## 2015-05-15 MED ORDER — AMIODARONE HCL IN DEXTROSE 360-4.14 MG/200ML-% IV SOLN
INTRAVENOUS | Status: AC
  Filled 2015-05-15: qty 200

## 2015-05-15 MED ORDER — AMIODARONE LOAD VIA INFUSION
150.0000 mg | Freq: Once | INTRAVENOUS | Status: AC
  Administered 2015-05-15: 150 mg via INTRAVENOUS
  Filled 2015-05-15: qty 83.34

## 2015-05-15 MED ORDER — FUROSEMIDE 10 MG/ML IJ SOLN
20.0000 mg | Freq: Once | INTRAMUSCULAR | Status: AC
  Administered 2015-05-15: 20 mg via INTRAVENOUS
  Filled 2015-05-15: qty 2

## 2015-05-15 MED ORDER — IMIPENEM-CILASTATIN 500 MG IV SOLR
500.0000 mg | Freq: Four times a day (QID) | INTRAVENOUS | Status: DC
  Administered 2015-05-15 – 2015-05-24 (×35): 500 mg via INTRAVENOUS
  Filled 2015-05-15 (×37): qty 500

## 2015-05-15 MED ORDER — AMIODARONE HCL IN DEXTROSE 360-4.14 MG/200ML-% IV SOLN
60.0000 mg/h | INTRAVENOUS | Status: DC
  Administered 2015-05-15: 60 mg/h via INTRAVENOUS

## 2015-05-15 MED ORDER — HALOPERIDOL LACTATE 5 MG/ML IJ SOLN: 5.0000 mg | Freq: Four times a day (QID) | INTRAMUSCULAR | Status: DC | PRN

## 2015-05-15 MED ORDER — HALOPERIDOL LACTATE 5 MG/ML IJ SOLN
5.0000 mg | Freq: Four times a day (QID) | INTRAMUSCULAR | Status: DC
  Filled 2015-05-15: qty 1

## 2015-05-15 MED ORDER — MAGNESIUM SULFATE 2 GM/50ML IV SOLN
2.0000 g | Freq: Once | INTRAVENOUS | Status: AC
  Administered 2015-05-15: 2 g via INTRAVENOUS
  Filled 2015-05-15: qty 50

## 2015-05-15 MED ORDER — AMIODARONE HCL IN DEXTROSE 360-4.14 MG/200ML-% IV SOLN: 30.0000 mg/h | INTRAVENOUS | Status: DC

## 2015-05-15 MED ORDER — POTASSIUM CHLORIDE 10 MEQ/100ML IV SOLN
10.0000 meq | INTRAVENOUS | Status: AC
  Administered 2015-05-15 (×2): 10 meq via INTRAVENOUS
  Filled 2015-05-15 (×2): qty 100

## 2015-05-15 MED ORDER — VANCOMYCIN HCL 10 G IV SOLR
1250.0000 mg | Freq: Two times a day (BID) | INTRAVENOUS | Status: DC
  Administered 2015-05-16 – 2015-05-17 (×3): 1250 mg via INTRAVENOUS
  Filled 2015-05-15 (×3): qty 1250

## 2015-05-15 MED ORDER — AMIODARONE IV BOLUS ONLY 150 MG/100ML
INTRAVENOUS | Status: AC
  Filled 2015-05-15: qty 100

## 2015-05-15 MED ORDER — METOPROLOL TARTRATE 1 MG/ML IV SOLN
2.5000 mg | Freq: Four times a day (QID) | INTRAVENOUS | Status: DC
  Administered 2015-05-15 – 2015-05-24 (×28): 2.5 mg via INTRAVENOUS
  Filled 2015-05-15 (×29): qty 5

## 2015-05-15 MED ORDER — MIDAZOLAM HCL 2 MG/2ML IJ SOLN
0.5000 mg | INTRAMUSCULAR | Status: DC | PRN
  Administered 2015-05-15 – 2015-05-17 (×4): 0.5 mg via INTRAVENOUS
  Filled 2015-05-15 (×4): qty 2

## 2015-05-15 MED ORDER — METOPROLOL TARTRATE 1 MG/ML IV SOLN
2.5000 mg | Freq: Once | INTRAVENOUS | Status: AC
  Administered 2015-05-15: 2.5 mg via INTRAVENOUS
  Filled 2015-05-15: qty 5

## 2015-05-15 NOTE — Progress Notes (Signed)
Patient doing well after 30 minutes on spontaneous breathing trial. Saturation 88-90% with normal work of breathing on pressure support 0/5with FiO2 0.3. Patient awake and following commands. Plan to check for cuff leak and if present extubate. Plan to use BiPAP as needed for increased work of breathing. Plan to replace NG tube for low intermittent suction after extubation.   Sonia Baller Ashok Cordia, M.D. [facility] Pulmonary & Critical Care Pager:  [phone] After 3pm or if no response, call [phone] 9:34 AM 05/15/2015

## 2015-05-15 NOTE — Progress Notes (Signed)
Pharmacy Antibiotic Note  Jenny Branch is a 60 y.o. female admitted on 05/09/2015 with respiratory distress / AECOPD experiencing severe agitation concerning for EtOH withdrawal.  Pharmacy has been consulted for Vancomycin and Levaquin dosing for persistent fever and elevated WBC while on doxycycline therapy.  Note allergy to penicillin (rash).    Plan: Day 4 vancomycin, Levaquin  New fevers this AM and re-cultured; after discussion with Dr. Ashok Cordia, will continue current antibiotics as ordered  Will check VT tonight  F/u cultures, renal fxn, clinical course  Height: 5\' 3"  (160 cm) Weight: 179 lb 10.8 oz (81.5 kg) IBW/kg (Calculated) : 52.4  Temp (24hrs), Avg:101.5 F (38.6 C), Min:99.7 F (37.6 C), Max:102.2 F (39 C)   Recent Labs Lab 05/09/15 1316  05/11/15 0309 05/12/15 0350 05/12/15 2205 05/13/15 0416 05/14/15 0319 05/15/15 0326  WBC  --   < > 12.8* 14.0*  --  12.1* 10.8* 14.1*  CREATININE  --   < > 1.50* 0.96 0.81 0.85 0.79 0.62  LATICACIDVEN 2.54*  --   --   --   --   --   --   --   < > = values in this interval not displayed.  Estimated Creatinine Clearance: 78.4 mL/min (by C-G formula based on Cr of 0.62).    Allergies  Allergen Reactions  . Penicillins Itching and Rash    Antimicrobials this admission: 3/7 (PTA) doxy >> 3/13 3/11 Vancomcyin >>   3/11 Levaquin >>   Dose adjustments this admission: ---  Microbiology results: 3/11 BCx: 1/2 CoNS 3/11 UCx: 50k E coli pansensitive 3/11 trach asp cx: GPCC, reincubating 3/8 MRSA PCR: negative Flu PCR: neg 3/14 UCx: sent 3/14 BCx: sent 3/14 Resp Cx: sent   Thank you for allowing pharmacy to be a part of this patient's care.  Reuel Boom, PharmD, BCPS Pager: [phone] 05/15/2015, 11:31 AM

## 2015-05-15 NOTE — Progress Notes (Signed)
Pharmacy Antibiotic Note  Jenny Branch is a 60 y.o. female admitted on 05/09/2015 with respiratory distress / AECOPD experiencing severe agitation concerning for EtOH withdrawal.  Pharmacy was initially consulted for Vancomycin and Levaquin dosing for persistent fever and elevated WBC while on doxycycline therapy. Note allergy to penicillin (rash).   She spiked fever to 102.2 earlier today and was re-cultured.  Due to prolonged QTc, Levaquin has been d/c and Pharmacy has been consulted for Primaxin dosing.  Plan:  Primaxin 500mg  IV q6h.  Increase to Vancomycin 1250 mg IV q12h.  Measure Vanc trough at steady state.  Goal 15-20.  Follow up renal fxn, culture results, and clinical course.    Height: 5\' 3"  (160 cm) Weight: 179 lb 10.8 oz (81.5 kg) IBW/kg (Calculated) : 52.4  Temp (24hrs), Avg:101 F (38.3 C), Min:99.3 F (37.4 C), Max:102.2 F (39 C)   Recent Labs Lab 05/09/15 1316  05/11/15 0309 05/12/15 0350 05/12/15 2205 05/13/15 0416 05/14/15 0319 05/15/15 0326 05/15/15 1555  WBC  --   < > 12.8* 14.0*  --  12.1* 10.8* 14.1*  --   CREATININE  --   < > 1.50* 0.96 0.81 0.85 0.79 0.62 0.65  LATICACIDVEN 2.54*  --   --   --   --   --   --   --   --   < > = values in this interval not displayed.  Estimated Creatinine Clearance: 78.4 mL/min (by C-G formula based on Cr of 0.65).    Allergies  Allergen Reactions  . Penicillins Itching and Rash    Antimicrobials this admission: 3/7 (PTA) doxy >> 3/13 3/11 Vancomcyin >>  3/11 Levaquin >> 3/14 3/14 >> Primaxin >>  Dose adjustments this admission: 3/14 1930 VT =13, below goal on 1g q12h (prior to 7th dose, previous doses charted on time)  Microbiology results: 3/11 BCx: 1/2 CoNS 3/11 UCx: 50k E coli pansensitive 3/11 trach asp cx: GPCC, reincubating 3/8 MRSA PCR: negative Flu PCR: neg 3/14 UCx: sent 3/14 BCx: sent 3/14 Resp Cx: sent  Thank you for allowing pharmacy to be a part of this patient's  care.  Gretta Arab PharmD, BCPS Pager [phone] 05/15/2015 4:41 PM

## 2015-05-15 NOTE — Progress Notes (Signed)
250cc fentanyl wasted in the sink with Kathie Rhodes, RN

## 2015-05-15 NOTE — Progress Notes (Signed)
MD notified of increased HR patient had a 41 beat run of SVT with a rate going as high as 240 on the cardiac monitor. Patient states she did feel her heart beating fast however no other changes in V/S. MD to order CXray, BMET and magnesium levels. Will continue to monitor patient and f/u

## 2015-05-15 NOTE — Progress Notes (Signed)
Nutrition Follow-up  DOCUMENTATION CODES:   Not applicable  INTERVENTION:  -RD to continue to monitor for needs -Diet advancement per MD   NUTRITION DIAGNOSIS:   Inadequate oral intake related to inability to eat as evidenced by NPO status. Ongoing  GOAL:   Patient will meet greater than or equal to 90% of their needs Not meeting  MONITOR:   Vent status, Labs, Weight trends, I & O's  REASON FOR ASSESSMENT:   Consult Enteral/tube feeding initiation and management  ASSESSMENT:   60 y.o. female with known COPD, asthma, HTN, depression, and PTSD, who presented to [facility] ED with main concern of several days duration of progressively worsening dyspnea, that initially started with exertion and has progressed to dyspnea at rest in the past 24 hours, this has been associated with mixed episodes of non productive and productive cough of white sputum, chest tightness that occurs with coughing spells, malaise and poor oral intake.  Ms. Goodine was extubated this morning to 5L [place]. No acute complaints at this time. NGT to be replaced for low-intermittent suction. No acute complaints currently.  Will continue to follow for diet advancement. Monitor weight trends, pt up 20# since admission.  Labs: Unremarkable Medications: Solumedrol  Diet Order:  Diet NPO time specified  Skin:  Reviewed, no issues  Last BM:  PTA  Height:   Ht Readings from Last 1 Encounters:  05/09/15 5\' 3"  (1.6 m)    Weight:   Wt Readings from Last 1 Encounters:  05/15/15 179 lb 10.8 oz (81.5 kg)    Ideal Body Weight:  52.3 kg  BMI:  Body mass index is 31.84 kg/(m^2).  Estimated Nutritional Needs:   Kcal:  1984  Protein:  105-115g  Fluid:  2L/day  EDUCATION NEEDS:   No education needs identified at this time  Satira Anis. Chantele Corado, MS, RD LDN After Hours/Weekend Pager [phone]

## 2015-05-15 NOTE — Progress Notes (Signed)
eLink Physician-Brief Progress Note Patient Name: Jenny Branch DOB: May 09, 1955 MRN: [id number]   Date of Service  05/15/2015  HPI/Events of Note  Reminded by Pharmacy that Amiodarone will prolong QTc interval.   eICU Interventions  Will order: 1. D/C Levaquin. 2. Start Primaxin per pharmacy. 3. D/C Amiodarone IV infusion. 4. Metoprolol 2.5 mg IV Q 6 hours.      Intervention Category Major Interventions: Arrhythmia - evaluation and management  Donnald Tabar Cornelia Copa 05/15/2015, 4:33 PM

## 2015-05-15 NOTE — Progress Notes (Signed)
eLink Physician-Brief Progress Note Patient Name: Jenny Branch DOB: 1955-11-22 MRN: [id number]   Date of Service  05/15/2015  HPI/Events of Note  Recurrent runs of SVT. K+ = 3.9, Mg++ = 1.7 and Creatinine = 0.67.  eICU Interventions  Will order: 1. Replete Mg++ and K+. 2. Metoprolol 2.5 mg IV now (extra dose).      Intervention Category Major Interventions: Arrhythmia - evaluation and management  Sommer,Steven Eugene 05/15/2015, 8:12 PM

## 2015-05-15 NOTE — Progress Notes (Signed)
[place] Progress Note Patient Name: Jenny Branch DOB: 1955/06/08 MRN: [id number]   Date of Service  05/15/2015  HPI/Events of Note  Elevated HR transiently to low 200's, Now 130's with sinus tachycardia.   eICU Interventions  Will order: 1. BMP and Mg++ level STAT. 2. 12 Lead EKG now.      Intervention Category Major Interventions: Arrhythmia - evaluation and management  Sommer,Steven Eugene 05/15/2015, 3:44 PM

## 2015-05-15 NOTE — Progress Notes (Signed)
eLink Physician-Brief Progress Note Patient Name: ELLERY BRIXEY DOB: 01/08/1956 MRN: [id number]   Date of Service  05/15/2015  HPI/Events of Note  hypomagnesemia  eICU Interventions  repleted     Intervention Category Minor Interventions: Electrolytes abnormality - evaluation and management  [name] 05/15/2015, 6:03 AM

## 2015-05-15 NOTE — Progress Notes (Signed)
MD paged. Pt has had increasing BP throughout the day. Last recorded BP 163/113 (130). Pt appears anxious and legs appear mottled. Home medication list reviewed and will mention this along with BP's to MD for possible sources of increased BP. Will continue to follow up.

## 2015-05-15 NOTE — Progress Notes (Addendum)
PULMONARY / CRITICAL CARE MEDICINE   Name: Jenny Branch MRN: [id number] DOB: 1955-03-08    ADMISSION DATE:  05/09/2015 CONSULTATION DATE:  05/09/15  REFERRING MD:  Dr. Doyle Askew   CHIEF COMPLAINT:  Respiratory Distress / AECOPD   BRIEF:  60 y/o female followed by BQ in the pulmonary office with COPD and ongoing tobacco use who was intubated on 3/9 for an AE of COPD.     SUBJECTIVE: No acute events overnight. Patient did have a fever early this morning. Patient more awake this morning and cooperative.  REVIEW OF SYSTEMS:  Unable to obtain given sedation & intubation.  VITAL SIGNS: BP 107/69 mmHg  Pulse 80  Temp(Src) 102 F (38.9 C) (Core (Comment))  Resp 18  Ht 5\' 3"  (1.6 m)  Wt 81.5 kg (179 lb 10.8 oz)  BMI 31.84 kg/m2  SpO2 97%  HEMODYNAMICS:    VENTILATOR SETTINGS: Vent Mode:  [-] PRVC FiO2 (%):  [30 %-40 %] 30 % Set Rate:  [18 bmp] 18 bmp Vt Set:  [420 mL] 420 mL PEEP:  [5 cmH20] 5 cmH20 Plateau Pressure:  [16 cmH20-20 cmH20] 18 cmH20  INTAKE / OUTPUT: I/O last 3 completed shifts: In: 6947 [I.V.:5317.8; NG/GT:629.3; IV Piggyback:1000] Out: [id number] [Urine:1705; Emesis/NG output:700]  PHYSICAL EXAMINATION: General:  Awake. No acute distress. Remains intubated.  Integument:  Warm & dry. No rash on exposed skin. Bruising on bilateral upper extremities. HEENT:  No scleral icterus. Endotracheal tube in place.  Cardiovascular:  Tachycardic. Mild bilateral upper extremity edema. No appreciable JVD.  Pulmonary:  Good aeration bilaterally. Symmetric chest wall rise on ventilator. Abdomen: Soft. Hypoactive bowel sounds. Protuberant. Neurological: Patient following commands. Wiggling toes appropriately. Moving all 4 extremities. Seems to nod to questions appropriately. Attends to voice.   LABS:  BMET  Recent Labs Lab 05/13/15 0416 05/14/15 0319 05/15/15 0326  NA 143 140 144  K 4.2 4.1 3.6  CL 104 102 106  CO2 30 30 29   BUN 22* 20 18  CREATININE 0.85 0.79 0.62   GLUCOSE 110* 142* 104*   Electrolytes  Recent Labs Lab 05/13/15 0416 05/14/15 0319 05/15/15 0326  CALCIUM 8.7* 8.6* 8.8*  MG 1.6* 1.5* 1.7  PHOS 2.5 3.1 2.6   CBC  Recent Labs Lab 05/13/15 0416 05/14/15 0319 05/15/15 0326  WBC 12.1* 10.8* 14.1*  HGB 14.6 13.9 13.6  HCT 43.2 43.1 40.8  PLT 251 221 212   Coag's No results for input(s): APTT, INR in the last 168 hours.  Sepsis Markers  Recent Labs Lab 05/09/15 1316  LATICACIDVEN 2.54*   ABG  Recent Labs Lab 05/09/15 2002 05/13/15 0305 05/14/15 0420  PHART 7.318* 7.334* 7.325*  PCO2ART 63.1* 58.7* 57.7*  PO2ART 549* 137* 130*   Liver Enzymes  Recent Labs Lab 05/15/15 0326  ALBUMIN 2.7*    Cardiac Enzymes No results for input(s): TROPONINI, PROBNP in the last 168 hours.  Glucose  Recent Labs Lab 05/14/15 0416 05/14/15 0758 05/14/15 1154 05/14/15 1636 05/14/15 2003 05/15/15 0437  GLUCAP 149* 151* 163* 126* 114* 89   STUDIES:  3/9 CXR images personally reviewed: ETT in place, no infiltrate, emphysema noted 3/8 KUB: OG tube in stomach KUB 3/12:  Mild gaseous distension of right & transverse colon. No obvious obstruction. Port CXR 3/13:  ETT 3.5 cm above carina. No focal opacity or effusion appreciated. Heart normal in size. EKG 3/14:  Normal sinus rhythm. QTc 461ms.  MICROBIOLOGY: Blood Ctx x2 3/14>>> Tracheal Asp Ctx 3/14>>> Urine Ctx 3/14>>> Blood  Ctx x2 3/11>>>Coag Neg Staph 1/2 Tracheal Asp Ctx 3/11>>>GPCs Urine Ctx 3/11>>>GNRs Flu PCR 3/8 nasal: neg Flu PCR 3/9 trach asp: neg  ANTIBIOTICS: Vancomycin 3/11>>> Levaquin 3/11>>> Doxycycline 3/8 - 3/11   SIGNIFICANT EVENTS: 3/08  Admit with COPD exacerbation, intubated 3/10  Intermittent agitation, concern for ETOH w/d on precedex   LINES/TUBES: OETT 7.5 3/8 >>  OGT 3/8>> Foley 3/8>> PIV  ASSESSMENT / PLAN:  PULMONARY A: Acute on Chronic Hypercarbic Respiratory Failure COPD Exacerbation Baseline GOLD D, FEV1 41%  pred Ongoing Tobacco Use  P:   PRVC support, 8cc/kg. Solu-Medrol to IV daily Continue Duoneb q6hr VAP prevention. Intermittent CXR. Possible extubation today if passes SBT Nicotine Patch 14mg /24hr  CARDIOVASCULAR A:  Tachycardia - Likely secondary to withdrawal & beta agonists.   P:  Tele monitoring. Vitals per unit protocol Catapres TTS-1 Patch EKG daily while on Haldol  RENAL A:   Hypomagnesemia - Resolved. Hyponatremia - Resolved Hypokalemia - Resolved. Anion Gap Acidosis - Resolved. Acute Renal Failure - Resolved. Hematuria - No gross blood.  P:   KVO IVF Trending renal function daily w/ BUN/Creatinine Electrolyte panel daily Replace electrolytes as needed  GASTROINTESTINAL A:   Ileus  P:   Holding tube Feedings Tube to lower intermittent suction Protonix IV daily Consider Senna tomorrow w/ further bowel rest  HEMATOLOGIC A:   Macrocytosis - Possibly secondary to EtOH. Leukocytosis - Possible new infection given fever.  P:  Trending cell counts daily w/ CBC Heparin off due to hematuria SCD's.  INFECTIOUS A:   Acute Bronchitis FUO - New AM 3/14.  P:   Awaiting final culture results. Day #4 Levaquin & Vancomcyin Repeat Blood, Urine, & Tracheal Aspirate Cultures Procalcitonin per algorithm Holding on adding any additional antibiotics at this time  ENDOCRINE A:   Hyperglycemia  - Improved.  P:   Monitor glucose   NEUROLOGIC A:   Acute encephalopathy - Likely due to ETOH withdrawal. Sedation on Ventilator H/O Anxiety  P:   RASS Goal:  0 to -1 Thiamine & Folic Acid IV daily Fentanyl gtt Precedex gtt PRN versed Catapres TTS-1 Patch Haldol 5mg  IV q6hr prn  FAMILY  - Updates: Uncle updated at bedside.  - Inter-disciplinary family meet or Palliative Care meeting due by:  05/16/15  TODAY'S SUMMARY: 60 year old female intubated for respiratory failure. Acute exacerbation of COPD as well as evidence of ongoing problems with  withdrawal. Known history of tobacco use. Patient's mental status has improved somewhat today. Currently tolerating spontaneous breathing trial. May attempt extubation if patient able to tolerate pressure support 0/5.  I have spent a total of 34 minutes of critical care time this morning caring for the patient, and reviewing the patient's electronic medical record.  Sonia Baller Ashok Cordia, M.D. [facility] Pulmonary & Critical Care Pager:  [phone] After 3pm or if no response, call [phone] 05/15/2015, 8:25 AM

## 2015-05-15 NOTE — Progress Notes (Signed)
[place] Progress Note Patient Name: Jenny Branch DOB: [birth year] MRN: [id number]   Date of Service  05/15/2015  HPI/Events of Note  QTc interval on 12 lead EKG = 0.567 seconds.   eICU Interventions  Will D/C Haldol and Zofran. May need to D/C Levaquin as well if QTc interval does not improve off of Haldol and Zofran.      Intervention Category Major Interventions: Arrhythmia - evaluation and management  [name] 05/15/2015, 4:00 PM

## 2015-05-15 NOTE — Progress Notes (Signed)
eLink Physician-Brief Progress Note Patient Name: Jenny Branch DOB: 1956/01/30 MRN: [id number]   Date of Service  05/15/2015  HPI/Events of Note  Another episode of SVT with Ventricular rate in 240's.  eICU Interventions  Will order: 1. Amiodarone IV bolus and infusion.   Await BMP and Mg++.     Intervention Category Major Interventions: Arrhythmia - evaluation and management  Joseph Bias Eugene 05/15/2015, 4:12 PM

## 2015-05-15 NOTE — Progress Notes (Signed)
MD made aware of repeated runs of SVT. Patient is resting calmly in the room.  Central telemetry asked to monitor patient closely. Will continue to monitor.

## 2015-05-15 NOTE — Procedures (Signed)
Extubation Procedure Note  Patient Details:   Name: Jenny Branch DOB: 07/15/1955 MRN: [id number]   Airway Documentation:     Evaluation  O2 sats: stable throughout Complications: No apparent complications Patient did tolerate procedure well. Bilateral Breath Sounds: Clear Suctioning: Airway Yes   Patient extuabated to 5L nasal cannula per MD order.  Positive cuff leak noted.  No evidence of stridor. Patient able to speak post extubation.  Sats currently 91%.  Vitals are stable.  Incentive spirometry performed x5 with achieved goal of 1000.  No apparent complications.   Philomena Doheny 05/15/2015, 10:32 AM

## 2015-05-15 NOTE — Progress Notes (Signed)
While at the patient's bedside noticed change in pt mental status. Patient asking the same questions repeatedly, appearing confused but calm. A full neuro assessment was preformed. Patient oriented x4, PERRLA, with equal motor response bilaterally. MD is aware of new onset confusion. Per MD continue to monitor patient and hold off on NGT. No further orders at this time.

## 2015-05-16 ENCOUNTER — Inpatient Hospital Stay (HOSPITAL_COMMUNITY): Payer: Medicare Other

## 2015-05-16 DIAGNOSIS — N390 Urinary tract infection, site not specified: Secondary | ICD-10-CM

## 2015-05-16 DIAGNOSIS — B9620 Unspecified Escherichia coli [E. coli] as the cause of diseases classified elsewhere: Secondary | ICD-10-CM

## 2015-05-16 LAB — CBC WITH DIFFERENTIAL/PLATELET
BASOS ABS: 0 10*3/uL (ref 0.0–0.1)
Basophils Relative: 0 %
EOS ABS: 0 10*3/uL (ref 0.0–0.7)
Eosinophils Relative: 0 %
HCT: 48.3 % — ABNORMAL HIGH (ref 36.0–46.0)
HEMOGLOBIN: 15.8 g/dL — AB (ref 12.0–15.0)
LYMPHS PCT: 11 %
Lymphs Abs: 2.2 10*3/uL (ref 0.7–4.0)
MCH: 36.2 pg — ABNORMAL HIGH (ref 26.0–34.0)
MCHC: 32.7 g/dL (ref 30.0–36.0)
MCV: 110.5 fL — ABNORMAL HIGH (ref 78.0–100.0)
Monocytes Absolute: 0.6 10*3/uL (ref 0.1–1.0)
Monocytes Relative: 3 %
NEUTROS ABS: 17 10*3/uL — AB (ref 1.7–7.7)
NEUTROS PCT: 86 %
PLATELETS: 315 10*3/uL (ref 150–400)
RBC: 4.37 MIL/uL (ref 3.87–5.11)
RDW: 15 % (ref 11.5–15.5)
WBC: 19.8 10*3/uL — ABNORMAL HIGH (ref 4.0–10.5)

## 2015-05-16 LAB — GLUCOSE, CAPILLARY
GLUCOSE-CAPILLARY: 87 mg/dL (ref 65–99)
GLUCOSE-CAPILLARY: 98 mg/dL (ref 65–99)
GLUCOSE-CAPILLARY: 102 mg/dL — AB (ref 65–99)
GLUCOSE-CAPILLARY: 101 mg/dL — AB (ref 65–99)
Glucose-Capillary: 77 mg/dL (ref 65–99)
Glucose-Capillary: 102 mg/dL — ABNORMAL HIGH (ref 65–99)

## 2015-05-16 LAB — RENAL FUNCTION PANEL
ALBUMIN: 3.4 g/dL — AB (ref 3.5–5.0)
ANION GAP: 13 (ref 5–15)
BUN: 19 mg/dL (ref 6–20)
CO2: 30 mmol/L (ref 22–32)
Calcium: 9 mg/dL (ref 8.9–10.3)
Chloride: 103 mmol/L (ref 101–111)
Creatinine, Ser: 0.66 mg/dL (ref 0.44–1.00)
GFR calc Af Amer: >60 mL/min (ref >60)
Glucose, Bld: 96 mg/dL (ref 65–99)
PHOSPHORUS: 4.6 mg/dL (ref 2.5–4.6)
POTASSIUM: 3.8 mmol/L (ref 3.5–5.1)
Sodium: 146 mmol/L — ABNORMAL HIGH (ref 135–145)

## 2015-05-16 LAB — BLOOD GAS, ARTERIAL
ACID-BASE DEFICIT: 1.7 mmol/L (ref 0.0–2.0)
Bicarbonate: 28 mEq/L — ABNORMAL HIGH (ref 20.0–24.0)
DRAWN BY: 295031
O2 Content: 3 L/min
O2 Saturation: 91.1 %
PATIENT TEMPERATURE: 98.6
PCO2 ART: 71.3 mmHg — AB (ref 35.0–45.0)
TCO2: 25.4 mmol/L (ref 0–100)
pH, Arterial: 7.219 — ABNORMAL LOW (ref 7.350–7.450)
pO2, Arterial: 73.4 mmHg — ABNORMAL LOW (ref 80.0–100.0)

## 2015-05-16 LAB — BASIC METABOLIC PANEL
Anion gap: 14 (ref 5–15)
BUN: 20 mg/dL (ref 6–20)
CALCIUM: 8.7 mg/dL — AB (ref 8.9–10.3)
CHLORIDE: 101 mmol/L (ref 101–111)
CO2: 29 mmol/L (ref 22–32)
CREATININE: 0.72 mg/dL (ref 0.44–1.00)
GFR calc non Af Amer: >60 mL/min (ref >60)
GLUCOSE: 115 mg/dL — AB (ref 65–99)
Potassium: 4.5 mmol/L (ref 3.5–5.1)
Sodium: 144 mmol/L (ref 135–145)

## 2015-05-16 LAB — CULTURE, RESPIRATORY W GRAM STAIN: Special Requests: NORMAL

## 2015-05-16 LAB — URINE CULTURE
Culture: NO GROWTH
Special Requests: NORMAL

## 2015-05-16 LAB — CULTURE, RESPIRATORY: CULTURE: NORMAL

## 2015-05-16 LAB — MAGNESIUM
MAGNESIUM: 2 mg/dL (ref 1.7–2.4)
Magnesium: 1.7 mg/dL (ref 1.7–2.4)

## 2015-05-16 MED ORDER — MAGNESIUM SULFATE 2 GM/50ML IV SOLN
2.0000 g | Freq: Once | INTRAVENOUS | Status: AC
  Administered 2015-05-16: 2 g via INTRAVENOUS
  Filled 2015-05-16: qty 50

## 2015-05-16 MED ORDER — HYDRALAZINE HCL 20 MG/ML IJ SOLN
10.0000 mg | INTRAMUSCULAR | Status: DC | PRN
  Administered 2015-05-16 – 2015-05-22 (×6): 10 mg via INTRAVENOUS
  Filled 2015-05-16 (×6): qty 1

## 2015-05-16 MED ORDER — METOPROLOL TARTRATE 1 MG/ML IV SOLN
2.5000 mg | Freq: Once | INTRAVENOUS | Status: AC
  Administered 2015-05-16: 2.5 mg via INTRAVENOUS
  Filled 2015-05-16: qty 5

## 2015-05-16 MED ORDER — FUROSEMIDE 10 MG/ML IJ SOLN
40.0000 mg | Freq: Once | INTRAMUSCULAR | Status: AC
  Administered 2015-05-16: 40 mg via INTRAVENOUS
  Filled 2015-05-16: qty 4

## 2015-05-16 NOTE — Progress Notes (Signed)
[place] Progress Note Patient Name: Jenny Branch DOB: March 31, 1955 MRN: [id number]   Date of Service  05/16/2015  HPI/Events of Note  ABG on BiPAP = 7.346/56.1/121 which is better than an earlier ABG and CXR reveals no acute cardiopulmonary disease.   eICU Interventions  Continue present management.      Intervention Category Intermediate Interventions: Respiratory distress - evaluation and management  Sommer,Steven Eugene 05/16/2015, 8:57 PM

## 2015-05-16 NOTE — Progress Notes (Signed)
PULMONARY / CRITICAL CARE MEDICINE   Name: Jenny Branch MRN: [id number] DOB: 09/05/1955    ADMISSION DATE:  05/09/2015 CONSULTATION DATE:  05/09/15  REFERRING MD:  Dr. Doyle Askew   CHIEF COMPLAINT:  Respiratory Distress / AECOPD   BRIEF:  60 y/o female followed by BQ in the pulmonary office with COPD and ongoing tobacco use who was intubated on 3/9 for an AE of COPD.     SUBJECTIVE: Patient extubated yesterday. Patient developed SVT yesterday and was started on amiodarone infusion. QTc interval reportedly prolonged and Haldol/Levaquin/Zofran were discontinued. Patient was started on Lopressor 2.5 mg IV every 6 hours. Patient is slightly more disoriented this morning but still awake. Patient is not answering my questions.  REVIEW OF SYSTEMS:  Unable to obtain given altered mentation.  VITAL SIGNS: BP 173/80 mmHg  Pulse 100  Temp(Src) 98.4 F (36.9 C) (Core (Comment))  Resp 19  Ht 5\' 3"  (1.6 m)  Wt 177 lb 0.5 oz (80.3 kg)  BMI 31.37 kg/m2  SpO2 100%  HEMODYNAMICS:    VENTILATOR SETTINGS:    INTAKE / OUTPUT: I/O last 3 completed shifts: In: 5823.8 [I.V.:3923.8; IV [id number] Out: 2930 [Urine:2580; Emesis/NG output:350]  PHYSICAL EXAMINATION: General:  Awake. No acute distress. Family at bedside. Integument:  Warm & dry. No rash on exposed skin.  HEENT:  No scleral icterus. Pupils equal.  Cardiovascular:  Tachycardic. Sinus tachycardia on telemetry. Mild bilateral upper extremity edema unchanged. Pulmonary:  Slightly diminished breath sounds in bilateral lung bases. Mildly increased work of breathing with some abdominal breathing. Abdomen: Soft. Hypoactive bowel sounds. Protuberant. Neurological: Patient attends to voice. Nods to questions but will not answer questions. Moving all 4 extremities.   LABS:  BMET  Recent Labs Lab 05/15/15 0326 05/15/15 1555 05/16/15 0317  NA 144 141 146*  K 3.6 3.9 3.8  CL 106 102 103  CO2 29 30 30   BUN 18 17 19   CREATININE 0.62  0.65 0.66  GLUCOSE 104* 114* 96   Electrolytes  Recent Labs Lab 05/14/15 0319 05/15/15 0326 05/15/15 1555 05/16/15 0317  CALCIUM 8.6* 8.8* 8.8* 9.0  MG 1.5* 1.7 1.7 2.0  PHOS 3.1 2.6  --  4.6   CBC  Recent Labs Lab 05/14/15 0319 05/15/15 0326 05/16/15 0317  WBC 10.8* 14.1* 19.8*  HGB 13.9 13.6 15.8*  HCT 43.1 40.8 48.3*  PLT 221 212 315   Coag's No results for input(s): APTT, INR in the last 168 hours.  Sepsis Markers  Recent Labs Lab 05/09/15 1316  LATICACIDVEN 2.54*   ABG  Recent Labs Lab 05/09/15 2002 05/13/15 0305 05/14/15 0420  PHART 7.318* 7.334* 7.325*  PCO2ART 63.1* 58.7* 57.7*  PO2ART 549* 137* 130*   Liver Enzymes  Recent Labs Lab 05/15/15 0326 05/16/15 0317  ALBUMIN 2.7* 3.4*    Cardiac Enzymes No results for input(s): TROPONINI, PROBNP in the last 168 hours.  Glucose  Recent Labs Lab 05/15/15 1149 05/15/15 1619 05/15/15 1939 05/15/15 2325 05/16/15 0420 05/16/15 0743  GLUCAP 93 107* 101* 77 87 102*   STUDIES:  3/9 CXR images personally reviewed: ETT in place, no infiltrate, emphysema noted 3/8 KUB: OG tube in stomach KUB 3/12:  Mild gaseous distension of right & transverse colon. No obvious obstruction. Port CXR 3/13:  ETT 3.5 cm above carina. No focal opacity or effusion appreciated. Heart normal in size. EKG 3/14:  Normal sinus rhythm. QTc 446ms. EKG 3/14:  QTc 590ms. Sinus tach w/ PVCs.  MICROBIOLOGY: Blood Ctx x2 3/14>>> Tracheal  Asp Ctx 3/14>>> Urine Ctx 3/14:  Negative  Blood Ctx x2 3/11>>>Coag Neg Staph 1/2 Tracheal Asp Ctx 3/11:  Oral [place] Urine Ctx 3/11:  E coli Flu PCR 3/8 nasal: neg Flu PCR 3/9 trach asp: neg  ANTIBIOTICS: Imipenem 3/14>>> Vancomycin 3/11>>> Doxycycline 3/8 - 3/11  Levaquin 3/11 - 3/14  SIGNIFICANT EVENTS: 3/08  Admit with COPD exacerbation, intubated 3/10  Intermittent agitation, concern for ETOH w/d on precedex  3/14  Extubation & runs of SVT  LINES/TUBES: Foley  3/8>> PIV OETT 7.5 3/8 - 3/14  OGT 3/8 - 3/14  ASSESSMENT / PLAN:  PULMONARY A: Acute on Chronic Hypercarbic Respiratory Failure - Improving. COPD Exacerbation Baseline GOLD D, FEV1 41% pred Ongoing Tobacco Use  P:   Solu-Medrol to IV daily Continue Duoneb q6hr IS for Pulmonary toilette Nicotine Patch 14mg /24hr Checking ABG now BiPAP prn work of breathing Gentle diuresis with Lasix IVx1 now  CARDIOVASCULAR A:  QT Prolongation - Likely due to medications. Tachycardia - SVT.  P:  Tele monitoring. Vitals per unit protocol Catapres TTS-1 Patch Lopressor 2.5mg  IV qid Hydralazine IV prn SBP & DBP goals EKG daily  Avoid QT prolonging medications  RENAL A:   Hypernatremia - Mild. Hypomagnesemia - Resolved. Hyponatremia - Resolved Hypokalemia - Resolved. Anion Gap Acidosis - Resolved. Acute Renal Failure - Resolved. Hematuria - No gross blood.  P:   KVO IVF Trending renal function daily w/ BUN/Creatinine Electrolyte panel daily Replace electrolytes as needed Repeat BMP & Magnesium @ 1700 today  GASTROINTESTINAL A:   Ileus  P:   Protonix IV daily NPO  HEMATOLOGIC A:   Macrocytosis - Possibly secondary to EtOH. Leukocytosis - Worsening. Possible new infection given fever.  P:  Trending cell counts daily w/ CBC Heparin off due to hematuria SCD's.  INFECTIOUS A:   Acute Bronchitis E coli UTI FUO - New AM 3/14.  P:   Awaiting final culture results. Day #5 Impipenem & Vancomcyin - Previously had 4 days of Levaquin Procalcitonin per algorithm  ENDOCRINE A:   Hyperglycemia  - Improved.  P:   Monitor glucose on labs daily  NEUROLOGIC A:   Acute encephalopathy - Waxing & waning. Question element of delirium.  H/O EtOH use H/O Anxiety  P:   Avoiding Sedating Medications Thiamine & Folic Acid IV daily Precedex gtt PRN versed Catapres TTS-1 Patch  FAMILY  - Updates: Family updated at bedside.  TODAY'S SUMMARY: 60 year old female  intubated for respiratory failure. Acute exacerbation of COPD as well as evidence of ongoing problems with withdrawal. Successfully extubated yesterday. Mental status continues to fluctuate which could indicate some delirium. I am concerned given her mildly increased work of breathing and am checking an ABG at this time. We will continue to monitor her closely in the intensive care unit given her altered mental status and high risk for further clinical decompensation and reintubation.  I have spent a total of 33 minutes of critical care time this morning caring for the patient, updating family at bedside, and reviewing the patient's electronic medical record.  Sonia Baller Ashok Cordia, M.D. [facility] Pulmonary & Critical Care Pager:  [phone] After 3pm or if no response, call [phone] 05/16/2015, 11:01 AM

## 2015-05-16 NOTE — Progress Notes (Signed)
[place] Progress Note Patient Name: Jenny Branch DOB: 1955/11/09 MRN: [id number]   Date of Service  05/16/2015  HPI/Events of Note  Hypertension and Sinus Tachycardia - BP = 170/98 and HR = 127.   eICU Interventions  Will give Metoprolol 2.5 mg IV now.      Intervention Category Major Interventions: Hypertension - evaluation and management  Sommer,Steven Eugene 05/16/2015, 10:27 PM

## 2015-05-16 NOTE — Progress Notes (Signed)
[place] Progress Note Patient Name: Jenny Branch DOB: 1955-04-25 MRN: [id number]   Date of Service  05/16/2015  HPI/Events of Note  Multiple issues: 1. Family reports increased WOB and 2. Mg++ = 1.7.  eICU Interventions  Will order: 1. ABG now. 2. Portable CXR now.  3. Replace Mg++.      Intervention Category Intermediate Interventions: Respiratory distress - evaluation and management;Electrolyte abnormality - evaluation and management  Keiara Sneeringer Eugene 05/16/2015, 8:00 PM

## 2015-05-17 ENCOUNTER — Inpatient Hospital Stay (HOSPITAL_COMMUNITY): Payer: Medicare Other

## 2015-05-17 LAB — BLOOD GAS, ARTERIAL
Acid-Base Excess: 3.2 mmol/L — ABNORMAL HIGH (ref 0.0–2.0)
Acid-Base Excess: 5.2 mmol/L — ABNORMAL HIGH (ref 0.0–2.0)
BICARBONATE: 31.7 meq/L — AB (ref 20.0–24.0)
Bicarbonate: 29.5 mEq/L — ABNORMAL HIGH (ref 20.0–24.0)
DELIVERY SYSTEMS: POSITIVE
DRAWN BY: 345601
Drawn by: 295031
Expiratory PAP: 6
FIO2: 0.35
FIO2: 1
INSPIRATORY PAP: 13
LHR: 16 {breaths}/min
LHR: 16 {breaths}/min
O2 SAT: 98.1 %
O2 Saturation: 99.8 %
PATIENT TEMPERATURE: 37.8
PCO2 ART: 56.1 mmHg — AB (ref 35.0–45.0)
PEEP: 0 cmH2O
PEEP: 5 cmH2O
PO2 ART: 121 mmHg — AB (ref 80.0–100.0)
PO2 ART: 536 mmHg — AB (ref 80.0–100.0)
Patient temperature: 37
Pressure control: 7 cmH2O
TCO2: 25.8 mmol/L (ref 0–100)
TCO2: 27.9 mmol/L (ref 0–100)
VT: 500 mL
pCO2 arterial: 56.4 mmHg — ABNORMAL HIGH (ref 35.0–45.0)
pH, Arterial: 7.346 — ABNORMAL LOW (ref 7.350–7.450)
pH, Arterial: 7.368 (ref 7.350–7.450)

## 2015-05-17 LAB — CBC WITH DIFFERENTIAL/PLATELET
Basophils Absolute: 0 10*3/uL (ref 0.0–0.1)
Basophils Relative: 0 %
Eosinophils Absolute: 0 10*3/uL (ref 0.0–0.7)
Eosinophils Relative: 0 %
HEMATOCRIT: 44.8 % (ref 36.0–46.0)
HEMOGLOBIN: 15.2 g/dL — AB (ref 12.0–15.0)
LYMPHS PCT: 7 %
Lymphs Abs: 1.5 10*3/uL (ref 0.7–4.0)
MCH: 35.5 pg — AB (ref 26.0–34.0)
MCHC: 33.9 g/dL (ref 30.0–36.0)
MCV: 104.7 fL — AB (ref 78.0–100.0)
Monocytes Absolute: 0.9 10*3/uL (ref 0.1–1.0)
Monocytes Relative: 4 %
NEUTROS ABS: 19 10*3/uL — AB (ref 1.7–7.7)
Neutrophils Relative %: 89 %
Platelets: 293 10*3/uL (ref 150–400)
RBC: 4.28 MIL/uL (ref 3.87–5.11)
RDW: 14.9 % (ref 11.5–15.5)
WBC: 21.4 10*3/uL — ABNORMAL HIGH (ref 4.0–10.5)

## 2015-05-17 LAB — RENAL FUNCTION PANEL
ANION GAP: 15 (ref 5–15)
Albumin: 3 g/dL — ABNORMAL LOW (ref 3.5–5.0)
BUN: 20 mg/dL (ref 6–20)
CALCIUM: 9.1 mg/dL (ref 8.9–10.3)
CO2: 27 mmol/L (ref 22–32)
CREATININE: 0.64 mg/dL (ref 0.44–1.00)
Chloride: 105 mmol/L (ref 101–111)
Glucose, Bld: 95 mg/dL (ref 65–99)
PHOSPHORUS: 3.5 mg/dL (ref 2.5–4.6)
Potassium: 4 mmol/L (ref 3.5–5.1)
SODIUM: 147 mmol/L — AB (ref 135–145)

## 2015-05-17 LAB — GLUCOSE, CAPILLARY
GLUCOSE-CAPILLARY: 100 mg/dL — AB (ref 65–99)
GLUCOSE-CAPILLARY: 119 mg/dL — AB (ref 65–99)
GLUCOSE-CAPILLARY: 123 mg/dL — AB (ref 65–99)
GLUCOSE-CAPILLARY: 98 mg/dL (ref 65–99)
GLUCOSE-CAPILLARY: 99 mg/dL (ref 65–99)
Glucose-Capillary: 93 mg/dL (ref 65–99)
Glucose-Capillary: 129 mg/dL — ABNORMAL HIGH (ref 65–99)

## 2015-05-17 LAB — CULTURE, BLOOD (ROUTINE X 2)

## 2015-05-17 LAB — MAGNESIUM: MAGNESIUM: 1.9 mg/dL (ref 1.7–2.4)

## 2015-05-17 MED ORDER — FENTANYL CITRATE (PF) 100 MCG/2ML IJ SOLN
INTRAMUSCULAR | Status: AC
  Administered 2015-05-17: 100 ug
  Filled 2015-05-17: qty 2

## 2015-05-17 MED ORDER — DEXMEDETOMIDINE HCL IN NACL 400 MCG/100ML IV SOLN
0.4000 ug/kg/h | INTRAVENOUS | Status: DC
  Administered 2015-05-17: 1.2 ug/kg/h via INTRAVENOUS
  Administered 2015-05-17: 1 ug/kg/h via INTRAVENOUS
  Administered 2015-05-18: 1.8 ug/kg/h via INTRAVENOUS
  Administered 2015-05-18: 2 ug/kg/h via INTRAVENOUS
  Administered 2015-05-18: 1.6 ug/kg/h via INTRAVENOUS
  Administered 2015-05-18: 1.8 ug/kg/h via INTRAVENOUS
  Administered 2015-05-18: 1.3 ug/kg/h via INTRAVENOUS
  Administered 2015-05-18: 1.8 ug/kg/h via INTRAVENOUS
  Administered 2015-05-18: 1 ug/kg/h via INTRAVENOUS
  Administered 2015-05-18: 1.8 ug/kg/h via INTRAVENOUS
  Administered 2015-05-19: 1.4 ug/kg/h via INTRAVENOUS
  Administered 2015-05-19: 2 ug/kg/h via INTRAVENOUS
  Administered 2015-05-19: 1.6 ug/kg/h via INTRAVENOUS
  Administered 2015-05-19 (×2): 2 ug/kg/h via INTRAVENOUS
  Administered 2015-05-19 (×2): 1.5 ug/kg/h via INTRAVENOUS
  Administered 2015-05-20 (×2): 1.8 ug/kg/h via INTRAVENOUS
  Administered 2015-05-20: 1.6 ug/kg/h via INTRAVENOUS
  Administered 2015-05-20: 1.2 ug/kg/h via INTRAVENOUS
  Administered 2015-05-20: 2 ug/kg/h via INTRAVENOUS
  Administered 2015-05-20 – 2015-05-21 (×2): 1.8 ug/kg/h via INTRAVENOUS
  Filled 2015-05-17 (×11): qty 100
  Filled 2015-05-17: qty 200
  Filled 2015-05-17 (×13): qty 100

## 2015-05-17 MED ORDER — MIDAZOLAM HCL 2 MG/2ML IJ SOLN
INTRAMUSCULAR | Status: AC
  Filled 2015-05-17: qty 2

## 2015-05-17 MED ORDER — FENTANYL CITRATE (PF) 100 MCG/2ML IJ SOLN
INTRAMUSCULAR | Status: AC
  Filled 2015-05-17: qty 2

## 2015-05-17 MED ORDER — METOPROLOL TARTRATE 1 MG/ML IV SOLN
5.0000 mg | Freq: Once | INTRAVENOUS | Status: AC
  Administered 2015-05-17: 5 mg via INTRAVENOUS

## 2015-05-17 MED ORDER — VITAL 1.5 CAL PO LIQD
1000.0000 mL | ORAL | Status: DC
  Administered 2015-05-17 – 2015-05-20 (×4): 1000 mL
  Filled 2015-05-17 (×5): qty 1000

## 2015-05-17 MED ORDER — ETOMIDATE 2 MG/ML IV SOLN
0.3000 mg/kg | Freq: Once | INTRAVENOUS | Status: AC
  Administered 2015-05-17: 20 mg via INTRAVENOUS

## 2015-05-17 MED ORDER — HEPARIN SODIUM (PORCINE) 5000 UNIT/ML IJ SOLN
5000.0000 [IU] | Freq: Three times a day (TID) | INTRAMUSCULAR | Status: DC
  Administered 2015-05-17 – 2015-05-25 (×24): 5000 [IU] via SUBCUTANEOUS
  Filled 2015-05-17 (×28): qty 1

## 2015-05-17 MED ORDER — FENTANYL CITRATE (PF) 100 MCG/2ML IJ SOLN
100.0000 ug | INTRAMUSCULAR | Status: DC | PRN
  Administered 2015-05-17 – 2015-05-18 (×7): 100 ug via INTRAVENOUS
  Filled 2015-05-17 (×7): qty 2

## 2015-05-17 MED ORDER — LORAZEPAM 2 MG/ML IJ SOLN
1.0000 mg | Freq: Once | INTRAMUSCULAR | Status: AC
  Administered 2015-05-17: 1 mg via INTRAVENOUS

## 2015-05-17 MED ORDER — BISACODYL 10 MG RE SUPP
10.0000 mg | Freq: Every day | RECTAL | Status: DC | PRN
  Administered 2015-05-18: 10 mg via RECTAL
  Filled 2015-05-17 (×2): qty 1

## 2015-05-17 MED ORDER — MIDAZOLAM HCL 2 MG/2ML IJ SOLN
INTRAMUSCULAR | Status: AC
  Administered 2015-05-17: 2 mg
  Filled 2015-05-17: qty 2

## 2015-05-17 MED ORDER — FUROSEMIDE 10 MG/ML IJ SOLN
20.0000 mg | Freq: Once | INTRAMUSCULAR | Status: AC
Start: 2015-05-17 — End: 2015-05-17
  Administered 2015-05-17: 20 mg via INTRAVENOUS
  Filled 2015-05-17: qty 2

## 2015-05-17 MED ORDER — SODIUM CHLORIDE 0.9% FLUSH
10.0000 mL | Freq: Two times a day (BID) | INTRAVENOUS | Status: DC
  Administered 2015-05-17: 10 mL
  Administered 2015-05-18: 30 mL
  Administered 2015-05-19 – 2015-05-22 (×6): 10 mL
  Administered 2015-05-22: 20 mL
  Administered 2015-05-23: 10 mL
  Administered 2015-05-24: 20 mL
  Administered 2015-05-24 (×2): 10 mL

## 2015-05-17 MED ORDER — MIDAZOLAM HCL 2 MG/2ML IJ SOLN
1.0000 mg | INTRAMUSCULAR | Status: DC | PRN
  Administered 2015-05-17 (×2): 2 mg via INTRAVENOUS
  Administered 2015-05-18 (×2): 1 mg via INTRAVENOUS
  Administered 2015-05-19 – 2015-05-21 (×3): 2 mg via INTRAVENOUS
  Filled 2015-05-17 (×7): qty 2

## 2015-05-17 MED ORDER — CHLORHEXIDINE GLUCONATE 0.12 % MT SOLN
OROMUCOSAL | Status: AC
  Filled 2015-05-17: qty 15

## 2015-05-17 MED ORDER — ROCURONIUM BROMIDE 50 MG/5ML IV SOLN
1.0000 mg/kg | Freq: Once | INTRAVENOUS | Status: AC
  Administered 2015-05-17: 70 mg via INTRAVENOUS

## 2015-05-17 MED ORDER — VITAL HIGH PROTEIN PO LIQD
1000.0000 mL | ORAL | Status: DC
  Filled 2015-05-17: qty 1000

## 2015-05-17 MED ORDER — FENTANYL CITRATE (PF) 100 MCG/2ML IJ SOLN
100.0000 ug | INTRAMUSCULAR | Status: AC | PRN
  Administered 2015-05-17 (×3): 100 ug via INTRAVENOUS
  Filled 2015-05-17 (×3): qty 2

## 2015-05-17 MED ORDER — SODIUM CHLORIDE 0.9% FLUSH
10.0000 mL | INTRAVENOUS | Status: DC | PRN
  Administered 2015-05-20: 10 mL
  Filled 2015-05-17: qty 40

## 2015-05-17 MED ORDER — PRO-STAT SUGAR FREE PO LIQD
30.0000 mL | Freq: Two times a day (BID) | ORAL | Status: DC
  Administered 2015-05-17 – 2015-05-21 (×8): 30 mL
  Filled 2015-05-17 (×8): qty 30

## 2015-05-17 MED ORDER — LORAZEPAM 2 MG/ML IJ SOLN
INTRAMUSCULAR | Status: AC
  Administered 2015-05-17: 1 mg via INTRAVENOUS
  Filled 2015-05-17: qty 1

## 2015-05-17 MED ORDER — METHYLPREDNISOLONE SODIUM SUCC 40 MG IJ SOLR
40.0000 mg | Freq: Every day | INTRAMUSCULAR | Status: DC
  Administered 2015-05-17 – 2015-05-21 (×5): 40 mg via INTRAVENOUS
  Filled 2015-05-17 (×5): qty 1

## 2015-05-17 NOTE — Progress Notes (Addendum)
Assessed at bedside for airway protection / respiratory status.  Patient arouses to voice but falls back to sleep.  Mild tachypnea with abdominal accessory muscle use.  On precedex with improvement in tachycardia / hypertension.  Wheezing bilaterally on exam.    Blood pressure 112/61, pulse 103, temperature 99.1 F (37.3 C), temperature source Core (Comment), resp. rate 25, height 5\' 3"  (1.6 m), weight 175 lb 4.3 oz (79.5 kg), SpO2 97 %.  Plan: Proceed with intubation for airway protection / evidence of respiratory muscle fatigue PRVC 8 cc/kg, Rate 16 CXR / ABG post intubation Continue precedex gtt post intubation  PRN fentanyl for pain   Noe Gens, NP-C [place] Pulmonary & Critical Care Pgr: [phone] or if no answer [phone] 05/17/2015, 1:34 PM

## 2015-05-17 NOTE — Progress Notes (Signed)
PULMONARY / CRITICAL CARE MEDICINE   Name: Jenny Branch MRN: [id number] DOB: [birth year]    ADMISSION DATE:  05/09/2015 CONSULTATION DATE:  05/09/15  REFERRING MD:  Dr. Doyle Askew   CHIEF COMPLAINT:  Respiratory Distress / AECOPD   BRIEF:  60 y/o female followed by BQ in the pulmonary office with COPD and ongoing tobacco use who was intubated on 3/9 for an AE of COPD. Extubated 3/14, delirium and BIPAP 3/15.     SUBJECTIVE: has been on BIPAP since ~ noon yesterday, not agitated but only intermittently following commands, tachycardia persists   VITAL SIGNS: BP 158/88 mmHg  Pulse 117  Temp(Src) 97.5 F (36.4 C) (Core (Comment))  Resp 28  Ht 5\' 3"  (1.6 m)  Wt 79.5 kg (175 lb 4.3 oz)  BMI 31.05 kg/m2  SpO2 97%  HEMODYNAMICS:    VENTILATOR SETTINGS: Vent Mode:  [-] BIPAP FiO2 (%):  [30 %-35 %] 35 % Set Rate:  [16 bmp] 16 bmp PEEP:  [6 cmH20] 6 cmH20 Pressure Support:  [6 cmH20-8 cmH20] 6 cmH20  INTAKE / OUTPUT: I/O last 3 completed shifts: In: [id number] [I.V.:3500; IV Piggyback:1850] Out: 8000 [Urine:8000]  PHYSICAL EXAMINATION: General:  tachypneic on BIPAP HENT: NCAT MM dry, BIPAP in place PULM: surprisingly no wheezing, air movement is slightly limited with BIPAP supported breaths CV: Tachy, no mgr GI: BS+, soft, mildly distended Derm: diaphoretic, flushing noted throughout, edema arms/legs, thin skin/bruising extensor surfaces Neuro: nods head to questions but doesn't follow commands, moves extremities spontaneously but not to command   LABS:  BMET  Recent Labs Lab 05/16/15 0317 05/16/15 1700 05/17/15 0333  NA 146* 144 147*  K 3.8 4.5 4.0  CL 103 101 105  CO2 30 29 27   BUN 19 20 20   CREATININE 0.66 0.72 0.64  GLUCOSE 96 115* 95   Electrolytes  Recent Labs Lab 05/15/15 0326  05/16/15 0317 05/16/15 1700 05/17/15 0332 05/17/15 0333  CALCIUM 8.8*  < > 9.0 8.7*  --  9.1  MG 1.7  < > 2.0 1.7 1.9  --   PHOS 2.6  --  4.6  --   --  3.5  < > = values in this  interval not displayed. CBC  Recent Labs Lab 05/15/15 0326 05/16/15 0317 05/17/15 0629  WBC 14.1* 19.8* 21.4*  HGB 13.6 15.8* 15.2*  HCT 40.8 48.3* 44.8  PLT 212 315 293   Coag's No results for input(s): APTT, INR in the last 168 hours.  Sepsis Markers No results for input(s): LATICACIDVEN, PROCALCITON, O2SATVEN in the last 168 hours. ABG  Recent Labs Lab 05/14/15 0420 05/16/15 1126 05/16/15 2030  PHART 7.325* 7.219* 7.346*  PCO2ART 57.7* 71.3* 56.1*  PO2ART 130* 73.4* 121*   Liver Enzymes  Recent Labs Lab 05/15/15 0326 05/16/15 0317 05/17/15 0333  ALBUMIN 2.7* 3.4* 3.0*    Cardiac Enzymes No results for input(s): TROPONINI, PROBNP in the last 168 hours.  Glucose  Recent Labs Lab 05/16/15 1155 05/16/15 1613 05/16/15 1957 05/17/15 0015 05/17/15 0501 05/17/15 0821  GLUCAP 98 102* 101* 99 93 100*   STUDIES:  3/9 CXR images personally reviewed: ETT in place, no infiltrate, emphysema noted 3/8 KUB: OG tube in stomach KUB 3/12:  Mild gaseous distension of right & transverse colon. No obvious obstruction Port CXR 3/13:  ETT 3.5 cm above carina. No focal opacity or effusion appreciated. Heart normal in size EKG 3/14:  Normal sinus rhythm. QTc 429ms EKG 3/14:  QTc 579ms. Sinus tach w/ PVCs  MICROBIOLOGY: Blood Ctx x2 3/14>>> Tracheal Asp Ctx 3/14>>> Urine Ctx 3/14:  Negative  Blood Ctx x2 3/11>>>Coag Neg Staph 1/2 Tracheal Asp Ctx 3/11:  Oral Flora Urine Ctx 3/11:  E coli Flu PCR 3/8 nasal: neg Flu PCR 3/9 trach asp: neg  ANTIBIOTICS: Imipenem 3/14>>> Vancomycin 3/11>>>3/15  Doxycycline 3/8 - 3/11  Levaquin 3/11 - 3/14  SIGNIFICANT EVENTS: 3/08  Admit with COPD exacerbation, intubated 3/10  Intermittent agitation, concern for ETOH w/d on precedex  3/14  Extubation & runs of SVT  LINES/TUBES: Foley 3/8>> PIV OETT 7.5 3/8 - 3/14  OGT 3/8 - 3/14  ASSESSMENT / PLAN:  60 y/o female with advanced COPD who is still smoking and has a problem  with alcohol abuse admitted on 3/8 with a COPD exacerbation requiring extubation.  Had UTI on hospital day 3.  Extubated on 3/14 but within 24 hours developed recurrent fever, tachycardia, hypertension, diaphoresis, delirium and worsening tachypnea.  On 3/16 lungs sound better than admission but overall she looks poorly today.  Ddx broad including infectious cause but no clear new infection so I favor a withdrawal syndrome; has been benzodiazepine and alcohol dependent for years and last benzodiazepines given on 3/14.  PULMONARY A: Acute on Chronic Hypercarbic Respiratory Failure - still BIPAP dependent COPD Exacerbation > improved exam findings Baseline GOLD D, FEV1 41% pred Ongoing Tobacco Use P:   Wean solumedrol Continue BIPAP for now If no improvement in respiratory status with benzodiazepines and precedex then re-intubate today Continue bronchodilators as ordered Continue nicotine patch  NEUROLOGIC A:   Acute encephalopathy > ICU delirium and possible benzo withdrawal  H/O EtOH use H/O Anxiety  P:   Start precedex again (last given [id number]) Thiamine & Folic Acid IV daily Ativan x1 dose now Nicotine patch  CARDIOVASCULAR A:  QT Prolongation - Likely due to medications Tachycardia - SVT P:  Tele monitoring. 12 lead now Continue scheduled metoprolol Adjust metoprolol after adding precedex Prn hydralazine Continue clonidine  RENAL A:   No acute issues P:   Monitor BMET and UOP Replace electrolytes as needed  GASTROINTESTINAL A:   Ileus P:   Protonix IV daily NPO Tube feedings if intubated  HEMATOLOGIC A:   Macrocytosis  Due to EtOH Leukocytosis - Worsening, steroid related? P:  Monitor for bleeding  INFECTIOUS A:   Acute Bronchitis E coli UTI FUO - New AM 3/14 > withdrawal? Related to UTI? P:   F/u culture results Stop vanc Continue imipenem Consider CT Ab/pelvis if no improvement with treating withdrawal  ENDOCRINE A:   Hyperglycemia  -  Improved P:   Monitor glucose on labs daily   FAMILY  - Updates: Uncle updated at bedside 3/16 by me  My cc time 40 minutes  Roselie Awkward, MD [place] [facility] Pager: [phone] Cell: [phone] After 3pm or if no response, call [phone]   05/17/2015, 9:07 AM

## 2015-05-17 NOTE — Progress Notes (Signed)
eLink Physician-Brief Progress Note Patient Name: Jenny Branch DOB: 10-13-1955 MRN: [id number]   Date of Service  05/17/2015  HPI/Events of Note  Patient intubated a few hours ago for weak cough, tachypnea, worsening wheezing despite improvement in mental status, HR, BP with precedex Now more awake, uncomfortable on vent  eICU Interventions  Increase prn versed dosing Increase upper limit of precedex Use prn fentanyl     Intervention Category Major Interventions: Respiratory failure - evaluation and management  [name] 05/17/2015, 4:45 PM

## 2015-05-17 NOTE — Progress Notes (Signed)
Post intubation CXR reviewed.  ETT in good position.     Noe Gens, NP-C [facility] Pgr: [phone] or if no answer [phone] 05/17/2015, 2:41 PM

## 2015-05-17 NOTE — Progress Notes (Signed)
Nutrition Follow-up  INTERVENTION:   Initiate Vital 1.5 @ 20 ml/hr and increase by 10 ml every 4 hours to goal rate of 40 ml/hr.   30 ml Prostat BID.    Tube feeding regimen provides 1640 kcal (98% of needs), 95 grams of protein, and 733 ml of H2O.   RD to continue to monitor  NUTRITION DIAGNOSIS:   Inadequate oral intake related to inability to eat as evidenced by NPO status.  Ongoing.  GOAL:   Patient will meet greater than or equal to 90% of their needs  Not meeting.  MONITOR:   Vent status, Labs, Weight trends, TF tolerance, I & O's  REASON FOR ASSESSMENT:   Consult Enteral/tube feeding initiation and management  ASSESSMENT:   60 y.o. female with known COPD, asthma, HTN, depression, and PTSD, who presented to [facility] ED with main concern of several days duration of progressively worsening dyspnea, that initially started with exertion and has progressed to dyspnea at rest in the past 24 hours, this has been associated with mixed episodes of non productive and productive cough of white sputum, chest tightness that occurs with coughing spells, malaise and poor oral intake.  Patient re-intubated today d/t respiratory muscle fatigue. RD consulted to start TF. Order placed based on recommendations above.   Patient is currently intubated on ventilator support MV: 11.6 L/min Temp (24hrs), Avg:99.5 F (37.5 C), Min:97.5 F (36.4 C), Max:100 F (37.8 C)  Medications: folic acid IV, thiamine IV Labs reviewed: CBGs: 98-100 Elevated Na Mg/Phos/K WNL  Diet Order:     Skin:  Reviewed, no issues  Last BM:  PTA  Height:   Ht Readings from Last 1 Encounters:  05/09/15 5\' 3"  (1.6 m)    Weight:   Wt Readings from Last 1 Encounters:  05/17/15 175 lb 4.3 oz (79.5 kg)    Ideal Body Weight:  52.3 kg  BMI:  Body mass index is 31.05 kg/(m^2).  Estimated Nutritional Needs:   Kcal:  1984  Protein:  105-115g  Fluid:  2L/day  EDUCATION NEEDS:   No education needs  identified at this time  Clayton Bibles, MS, RD, LDN Pager: [phone] After Hours Pager: [phone]

## 2015-05-17 NOTE — Procedures (Signed)
Intubation Procedure Note Jenny Branch [id number] [birth year]  Procedure: Intubation Indications: Airway protection and maintenance  Procedure Details Consent: Risks of procedure as well as the alternatives and risks of each were explained to the (patient/caregiver).  Consent for procedure obtained. Time Out: Verified patient identification, verified procedure, site/side was marked, verified correct patient position, special equipment/implants available, medications/allergies/relevent history reviewed, required imaging and test results available.  Performed  Drugs Etomidate 20mg , Rocuronium 70mg , Versed 2mg  IV, Fentanyl 13mcg DL x 1 with GS 3 blade Grade 1 view 7.5 ET tube passed through cords under direct visualization Placement confirmed with bilateral breath sounds, positive EtCO2 change and smoke in tube   Evaluation Hemodynamic Status: BP stable throughout; O2 sats: stable throughout Patient's Current Condition: stable Complications: No apparent complications Patient did tolerate procedure well. Chest X-ray ordered to verify placement.  CXR: pending.   Jenny Branch 05/17/2015

## 2015-05-17 NOTE — Progress Notes (Signed)
Peripherally Inserted Central Catheter/Midline Placement  The IV Nurse has discussed with the patient and/or persons authorized to consent for the patient, the purpose of this procedure and the potential benefits and risks involved with this procedure.  The benefits include less needle sticks, lab draws from the catheter and patient may be discharged home with the catheter.  Risks include, but not limited to, infection, bleeding, blood clot (thrombus formation), and puncture of an artery; nerve damage and irregular heat beat.  Alternatives to this procedure were also discussed.  Consent obtained from mother due to patient being sedated on ventilator.  PICC/Midline Placement Documentation        Jenny Branch, [name] 05/17/2015, 6:32 PM

## 2015-05-17 NOTE — Progress Notes (Signed)
eLink Physician-Brief Progress Note Patient Name: Jenny Branch DOB: 12-24-1955 MRN: [id number]   Date of Service  05/17/2015  HPI/Events of Note  Elevated BP 192/92  eICU Interventions  Metoprolol IV 5mg  x 1 dose     Intervention Category Major Interventions: Hypertension - evaluation and management  Yaris Ferrell 05/17/2015, 2:12 AM

## 2015-05-18 LAB — GLUCOSE, CAPILLARY
GLUCOSE-CAPILLARY: 175 mg/dL — AB (ref 65–99)
GLUCOSE-CAPILLARY: 169 mg/dL — AB (ref 65–99)
Glucose-Capillary: 141 mg/dL — ABNORMAL HIGH (ref 65–99)
Glucose-Capillary: 193 mg/dL — ABNORMAL HIGH (ref 65–99)
Glucose-Capillary: 159 mg/dL — ABNORMAL HIGH (ref 65–99)

## 2015-05-18 LAB — CULTURE, RESPIRATORY W GRAM STAIN

## 2015-05-18 LAB — RENAL FUNCTION PANEL
Albumin: 2.8 g/dL — ABNORMAL LOW (ref 3.5–5.0)
Anion gap: 9 (ref 5–15)
BUN: 22 mg/dL — ABNORMAL HIGH (ref 6–20)
CALCIUM: 8.9 mg/dL (ref 8.9–10.3)
CO2: 33 mmol/L — ABNORMAL HIGH (ref 22–32)
CREATININE: 0.64 mg/dL (ref 0.44–1.00)
Chloride: 108 mmol/L (ref 101–111)
Glucose, Bld: 132 mg/dL — ABNORMAL HIGH (ref 65–99)
Phosphorus: 2.5 mg/dL (ref 2.5–4.6)
Potassium: 3.2 mmol/L — ABNORMAL LOW (ref 3.5–5.1)
SODIUM: 150 mmol/L — AB (ref 135–145)

## 2015-05-18 LAB — CBC WITH DIFFERENTIAL/PLATELET
BASOS ABS: 0 10*3/uL (ref 0.0–0.1)
Basophils Relative: 0 %
EOS ABS: 0 10*3/uL (ref 0.0–0.7)
EOS PCT: 0 %
HEMATOCRIT: 40.4 % (ref 36.0–46.0)
Hemoglobin: 13 g/dL (ref 12.0–15.0)
Lymphocytes Relative: 5 %
Lymphs Abs: 0.6 10*3/uL — ABNORMAL LOW (ref 0.7–4.0)
MCH: 35.3 pg — ABNORMAL HIGH (ref 26.0–34.0)
MCHC: 32.2 g/dL (ref 30.0–36.0)
MCV: 109.8 fL — AB (ref 78.0–100.0)
Monocytes Absolute: 0.9 10*3/uL (ref 0.1–1.0)
Monocytes Relative: 9 %
Neutro Abs: 9.3 10*3/uL — ABNORMAL HIGH (ref 1.7–7.7)
Neutrophils Relative %: 86 %
Platelets: 207 10*3/uL (ref 150–400)
RBC: 3.68 MIL/uL — AB (ref 3.87–5.11)
RDW: 15.2 % (ref 11.5–15.5)
WBC: 10.8 10*3/uL — AB (ref 4.0–10.5)

## 2015-05-18 LAB — CULTURE, BLOOD (ROUTINE X 2): Culture: NO GROWTH

## 2015-05-18 LAB — CULTURE, RESPIRATORY: SPECIAL REQUESTS: NORMAL

## 2015-05-18 LAB — MAGNESIUM: MAGNESIUM: 1.9 mg/dL (ref 1.7–2.4)

## 2015-05-18 MED ORDER — ANTISEPTIC ORAL RINSE SOLUTION (CORINZ)
7.0000 mL | OROMUCOSAL | Status: DC
  Administered 2015-05-18 – 2015-05-19 (×13): 7 mL via OROMUCOSAL

## 2015-05-18 MED ORDER — POTASSIUM CHLORIDE 20 MEQ/15ML (10%) PO SOLN
30.0000 meq | ORAL | Status: AC
  Administered 2015-05-18 (×2): 30 meq
  Filled 2015-05-18 (×3): qty 30

## 2015-05-18 MED ORDER — SODIUM CHLORIDE 0.9 % IV SOLN
25.0000 ug/h | INTRAVENOUS | Status: DC
  Administered 2015-05-18: 50 ug/h via INTRAVENOUS
  Administered 2015-05-19: 300 ug/h via INTRAVENOUS
  Administered 2015-05-19: 225 ug/h via INTRAVENOUS
  Administered 2015-05-19 – 2015-05-20 (×2): 100 ug/h via INTRAVENOUS
  Filled 2015-05-18 (×5): qty 50

## 2015-05-18 MED ORDER — CHLORHEXIDINE GLUCONATE 0.12% ORAL RINSE (MEDLINE KIT)
15.0000 mL | Freq: Two times a day (BID) | OROMUCOSAL | Status: DC
Start: 2015-05-18 — End: 2015-05-19
  Administered 2015-05-18 – 2015-05-19 (×3): 15 mL via OROMUCOSAL

## 2015-05-18 MED ORDER — INSULIN ASPART 100 UNIT/ML ~~LOC~~ SOLN
0.0000 [IU] | SUBCUTANEOUS | Status: DC
  Administered 2015-05-18: 2 [IU] via SUBCUTANEOUS
  Administered 2015-05-18 (×2): 3 [IU] via SUBCUTANEOUS
  Administered 2015-05-19 (×2): 2 [IU] via SUBCUTANEOUS
  Administered 2015-05-19 (×2): 3 [IU] via SUBCUTANEOUS
  Administered 2015-05-19 (×2): 2 [IU] via SUBCUTANEOUS
  Administered 2015-05-20: 3 [IU] via SUBCUTANEOUS
  Administered 2015-05-20 – 2015-05-24 (×10): 2 [IU] via SUBCUTANEOUS

## 2015-05-18 NOTE — Progress Notes (Signed)
Pharmacy Antibiotic Note  Jenny Branch is a 60 y.o. female admitted on 05/09/2015 with respiratory distress / AECOPD experiencing severe agitation concerning for EtOH withdrawal.  Pharmacy was initially consulted for Vancomycin and Levaquin dosing for persistent fever and elevated WBC while on doxycycline therapy. Note allergy to penicillin (rash). She spiked fever to 102.2 on this regimen & due to prolonged QTc, Levaquin was changed to Primaxin per RX dosing on 3/14.    05/18/2015:  WBC trending down  Renal function stable  Low grade fevers noted 3/16 PM (missed 6pm Primaxin dose on this day)  Patient re-intubated 3/16 due to withdrawal syndrome  Currently on day# 4 Primaxin (7 total antibiotic days)  Repeat cx data has been negative  QTc normalized off Levaquin, Haldol  Plan:  Primaxin 500mg  IV q6h.  Follow up renal fxn, culture results, and clinical course.  Noted plan to d/c abx tomorrow if patient remains clinically stable  Height: 5\' 3"  (160 cm) Weight: 177 lb 4 oz (80.4 kg) IBW/kg (Calculated) : 52.4  Temp (24hrs), Avg:100 F (37.8 C), Min:99.1 F (37.3 C), Max:101.1 F (38.4 C)   Recent Labs Lab 05/14/15 0319 05/15/15 0326 05/15/15 1555 05/15/15 1958 05/16/15 0317 05/16/15 1700 05/17/15 0333 05/17/15 0629 05/18/15 0133  WBC 10.8* 14.1*  --   --  19.8*  --   --  21.4* 10.8*  CREATININE 0.79 0.62 0.65  --  0.66 0.72 0.64  --  0.64  VANCOTROUGH  --   --   --  13  --   --   --   --   --     Estimated Creatinine Clearance: 77.9 mL/min (by C-G formula based on Cr of 0.64).    Allergies  Allergen Reactions  . Penicillins Itching and Rash    Antimicrobials this admission: 3/7 (PTA) doxy >> 3/13 3/11 Vancomcyin >> 3/16 3/11 Levaquin >> 3/14 3/14 >> Primaxin >>  Dose adjustments this admission: 3/14 1930 VT =13, below goal on 1g q12h (prior to 7th dose, previous doses charted on time)  Microbiology results: 3/11 BCx: 1/2 CoNS 3/11 UCx: 50k E coli  pansensitive 3/11 trach asp cx: normal flora 3/8 MRSA PCR: negative Flu PCR: neg 3/14 UCx: NGF 3/14 BCx: ngtd 3/14 trach aspirate: few candida  Thank you for allowing pharmacy to be a part of this patient's care.  Netta Cedars, PharmD, BCPS Pager: [phone]  05/18/2015 10:53 AM

## 2015-05-18 NOTE — Care Management Note (Signed)
Case Management Note  Patient Details  Name: HARSHITHA MARCENGILL MRN: [id number] Date of Birth: 02-01-1956  Subjective/Objective: Both LTAC facilities are following-[facility]-both currently can accept-please put in LTAC cons for case mgmnt-the both LTAC can talk to patient about their services if MD in agreement-there is a small window to qualify for LTAC.                   Action/Plan:d/c plan home.   Expected Discharge Date:   (UNKNOWN)               Expected Discharge Plan:  Home/Self Care  In-House Referral:  NA  Discharge planning Services  CM Consult  Post Acute Care Choice:  NA Choice offered to:  NA  DME Arranged:    DME Agency:     HH Arranged:    HH Agency:     Status of Service:  In process, will continue to follow  Medicare Important Message Given:    Date Medicare IM Given:    Medicare IM give by:    Date Additional Medicare IM Given:    Additional Medicare Important Message give by:     If discussed at [place] of Stay Meetings, dates discussed:    Additional Comments:  Dessa Phi, RN 05/18/2015, 3:24 PM

## 2015-05-18 NOTE — Progress Notes (Signed)
[facility] ADULT ICU REPLACEMENT PROTOCOL FOR AM LAB REPLACEMENT ONLY  The patient does apply for the [facility] Adult ICU Electrolyte Replacment Protocol based on the criteria listed below:   1. Is GFR >/= 40 ml/min? Yes.    Patient's GFR today is >60 2. Is urine output >/= 0.5 ml/kg/hr for the last 6 hours? Yes.   Patient's UOP is 0.5 ml/kg/hr 3. Is BUN < 60 mg/dL? Yes.    Patient's BUN today is 22 4. Abnormal electrolyte  K 3.2 5. Ordered repletion with: per protocol 6. If a panic level lab has been reported, has the CCM MD in charge been notified? Yes.  .   Physician:  E Deterding  Christeen Douglas 05/18/2015 3:12 AM

## 2015-05-18 NOTE — Care Management Note (Signed)
Case Management Note  Patient Details  Name: Jenny Branch MRN: [id number] Date of Birth: [birth year]  Subjective/Objective:Noted re intubated-vent. LTAC screen.                    Action/Plan:d/c plan home.   Expected Discharge Date:   (UNKNOWN)               Expected Discharge Plan:  Home/Self Care  In-House Referral:  NA  Discharge planning Services  CM Consult  Post Acute Care Choice:  NA Choice offered to:  NA  DME Arranged:    DME Agency:     HH Arranged:    HH Agency:     Status of Service:  In process, will continue to follow  Medicare Important Message Given:    Date Medicare IM Given:    Medicare IM give by:    Date Additional Medicare IM Given:    Additional Medicare Important Message give by:     If discussed at [place] of Stay Meetings, dates discussed:    Additional Comments:  Dessa Phi, RN 05/18/2015, 12:48 PM

## 2015-05-18 NOTE — Progress Notes (Signed)
PULMONARY / CRITICAL CARE MEDICINE   Name: Jenny Branch MRN: [id number] DOB: [birth year]    ADMISSION DATE:  05/09/2015 CONSULTATION DATE:  05/09/15  REFERRING MD:  Dr. Doyle Askew   CHIEF COMPLAINT:  Respiratory Distress / AECOPD   BRIEF:  60 y/o female followed by BQ in the pulmonary office with COPD and ongoing tobacco use who was intubated on 3/9 for an AE of COPD. Extubated 3/14, delirium and BIPAP 3/15.  Re-intubated 3/16     SUBJECTIVE: re-intubated yesterday, anxious on vent this morning   VITAL SIGNS: BP 141/71 mmHg  Pulse 65  Temp(Src) 99.5 F (37.5 C) (Core (Comment))  Resp 15  Ht 5\' 3"  (1.6 m)  Wt 80.4 kg (177 lb 4 oz)  BMI 31.41 kg/m2  SpO2 98%  HEMODYNAMICS:    VENTILATOR SETTINGS: Vent Mode:  [-] PSV;CPAP FiO2 (%):  [30 %-100 %] 30 % Set Rate:  [16 bmp] 16 bmp Vt Set:  [500 mL] 500 mL PEEP:  [5 cmH20] 5 cmH20 Pressure Support:  [5 cmH20-8 cmH20] 8 cmH20 Plateau Pressure:  [18 cmH20-27 cmH20] 27 cmH20  INTAKE / OUTPUT: I/O last 3 completed shifts: In: 5309.9 [I.V.:4054.9; NG/GT:455; IV Piggyback:800] Out: 4605 [Urine:4605]  PHYSICAL EXAMINATION: General:  Awake on vent, anxious HENT: NCAT ETT in place PULM: minimal wheezing, vent supported breaths CV: RRR, no mgr GI: BS+, soft, nontender MSK: Normal bulk and tone Derm: bruising extremities Neuro: Awake on vent, anxious, trying to write but very confused, can't focus   LABS:  BMET  Recent Labs Lab 05/16/15 1700 05/17/15 0333 05/18/15 0133  NA 144 147* 150*  K 4.5 4.0 3.2*  CL 101 105 108  CO2 29 27 33*  BUN 20 20 22*  CREATININE 0.72 0.64 0.64  GLUCOSE 115* 95 132*   Electrolytes  Recent Labs Lab 05/16/15 0317 05/16/15 1700 05/17/15 0332 05/17/15 0333 05/18/15 0133  CALCIUM 9.0 8.7*  --  9.1 8.9  MG 2.0 1.7 1.9  --  1.9  PHOS 4.6  --   --  3.5 2.5   CBC  Recent Labs Lab 05/16/15 0317 05/17/15 0629 05/18/15 0133  WBC 19.8* 21.4* 10.8*  HGB 15.8* 15.2* 13.0  HCT 48.3*  44.8 40.4  PLT 315 293 207   Coag's No results for input(s): APTT, INR in the last 168 hours.  Sepsis Markers No results for input(s): LATICACIDVEN, PROCALCITON, O2SATVEN in the last 168 hours. ABG  Recent Labs Lab 05/16/15 1126 05/16/15 2030 05/17/15 1422  PHART 7.219* 7.346* 7.368  PCO2ART 71.3* 56.1* 56.4*  PO2ART 73.4* 121* 536*   Liver Enzymes  Recent Labs Lab 05/16/15 0317 05/17/15 0333 05/18/15 0133  ALBUMIN 3.4* 3.0* 2.8*    Cardiac Enzymes No results for input(s): TROPONINI, PROBNP in the last 168 hours.  Glucose  Recent Labs Lab 05/17/15 1128 05/17/15 1619 05/17/15 2032 05/17/15 2344 05/18/15 0324 05/18/15 0739  GLUCAP 98 119* 129* 123* 141* 175*   STUDIES:  3/9 CXR images personally reviewed: ETT in place, no infiltrate, emphysema noted 3/8 KUB: OG tube in stomach KUB 3/12:  Mild gaseous distension of right & transverse colon. No obvious obstruction Port CXR 3/13:  ETT 3.5 cm above carina. No focal opacity or effusion appreciated. Heart normal in size EKG 3/14:  Normal sinus rhythm. QTc 462ms EKG 3/14:  QTc 524ms. Sinus tach w/ PVCs   MICROBIOLOGY: Blood Ctx x2 3/14>>> Tracheal Asp Ctx 3/14>>> Urine Ctx 3/14:  Negative   Blood Ctx x2 3/11>>>Coag Neg Staph 1/2  Tracheal Asp Ctx 3/11:  Oral Flora Urine Ctx 3/11:  E coli  Flu PCR 3/8 nasal: neg Flu PCR 3/9 trach asp: neg   ANTIBIOTICS: Imipenem 3/14>>> Vancomycin 3/11>>>3/15  Doxycycline 3/8 - 3/11  Levaquin 3/11 - 3/14  SIGNIFICANT EVENTS: 3/08  Admit with COPD exacerbation, intubated 3/10  Intermittent agitation, concern for ETOH w/d on precedex  3/14  Extubation & runs of SVT  LINES/TUBES: Foley 3/8>> PIV  OETT 7.5 3/8 - 3/14, 3/16 > OGT 3/8 - 3/14, 3/16 > PICC 3/16 >    ASSESSMENT / PLAN:  60 y/o female with advanced COPD who is still smoking and has a problem with alcohol abuse admitted on 3/8 with a COPD exacerbation requiring extubation.  Had UTI on hospital day 3.   Extubated on 3/14 but within 24 hours developed recurrent fever, tachycardia, hypertension, diaphoresis, delirium and worsening tachypnea.  On 3/16 she was re-intubated.   PULMONARY A: Acute on Chronic Hypercarbic Respiratory Failure - failing SBT this morning, I think she needs a tracheostomy COPD Exacerbation > improved exam findings Baseline GOLD D, FEV1 41% pred Ongoing Tobacco Use P:   Continue solumedrol at current dosing Pressure support as much as tolerated during daytime Tracheostomy planned Monday at 1pm with Dr. Nelda Marseille if not extubated over weekend Could benefit from LTAC Continue bronchodilators as ordered Continue nicotine patch  NEUROLOGIC A:   Acute encephalopathy > ICU delirium ? Benzo dependence  H/O EtOH use H/O Anxiety > severe here when awake  P:   Continue precedex  Fentanyl infusion (was requiring q2h intermittent pushes) Thiamine & Folic Acid IV daily Prn versed Nicotine patch  CARDIOVASCULAR A:  QT Prolongation - resolved Tachycardia - SVT P:  Tele monitoring Continue scheduled metoprolol Prn hydralazine Continue clonidine  RENAL A:   No acute issues P:   Monitor BMET and UOP Replace electrolytes as needed  GASTROINTESTINAL A:   Ileus > better P:   Protonix IV daily Tube feedings   HEMATOLOGIC A:   Macrocytosis  Due to EtOH Leukocytosis - Worsening, steroid related? P:  Monitor for bleeding  INFECTIOUS A:   Acute Bronchitis E coli UTI FUO - New AM 3/14, persistent > withdrawal? Related to UTI? P:   F/u culture results Consider stopping imipenem 3/18 if stable Consider CT Ab/pelvis if no improvement with treating withdrawal, doubt needed with improving leukocytosis  ENDOCRINE A:   Hyperglycemia  - Improved P:   Monitor glucose on labs daily   FAMILY  - Updates: Uncle updated at bedside 3/17 by me, sister updated 3/16; tried calling sister Magda Paganini on 3/17, had to leave a message  My cc time 36 minutes  Roselie Awkward, MD [place] [facility] Pager: [phone] Cell: [phone] After 3pm or if no response, call [phone]   05/18/2015, 12:16 PM

## 2015-05-19 ENCOUNTER — Inpatient Hospital Stay (HOSPITAL_COMMUNITY): Payer: Medicare Other

## 2015-05-19 LAB — CBC WITH DIFFERENTIAL/PLATELET
Basophils Absolute: 0 10*3/uL (ref 0.0–0.1)
Basophils Relative: 0 %
EOS ABS: 0 10*3/uL (ref 0.0–0.7)
EOS PCT: 0 %
HCT: 40.3 % (ref 36.0–46.0)
Hemoglobin: 13.6 g/dL (ref 12.0–15.0)
LYMPHS ABS: 0.6 10*3/uL — AB (ref 0.7–4.0)
Lymphocytes Relative: 3 %
MCH: 35.5 pg — AB (ref 26.0–34.0)
MCHC: 33.7 g/dL (ref 30.0–36.0)
MCV: 105.2 fL — AB (ref 78.0–100.0)
MONO ABS: 1.4 10*3/uL — AB (ref 0.1–1.0)
MONOS PCT: 8 %
Neutro Abs: 15.2 10*3/uL — ABNORMAL HIGH (ref 1.7–7.7)
Neutrophils Relative %: 89 %
PLATELETS: 261 10*3/uL (ref 150–400)
RBC: 3.83 MIL/uL — AB (ref 3.87–5.11)
RDW: 15.1 % (ref 11.5–15.5)
WBC: 17.1 10*3/uL — AB (ref 4.0–10.5)

## 2015-05-19 LAB — GLUCOSE, CAPILLARY
GLUCOSE-CAPILLARY: 137 mg/dL — AB (ref 65–99)
GLUCOSE-CAPILLARY: 180 mg/dL — AB (ref 65–99)
Glucose-Capillary: 124 mg/dL — ABNORMAL HIGH (ref 65–99)
Glucose-Capillary: 150 mg/dL — ABNORMAL HIGH (ref 65–99)
Glucose-Capillary: 170 mg/dL — ABNORMAL HIGH (ref 65–99)

## 2015-05-19 LAB — RENAL FUNCTION PANEL
Albumin: 2.9 g/dL — ABNORMAL LOW (ref 3.5–5.0)
Anion gap: 5 (ref 5–15)
BUN: 21 mg/dL — AB (ref 6–20)
CALCIUM: 9 mg/dL (ref 8.9–10.3)
CO2: 32 mmol/L (ref 22–32)
CREATININE: 0.53 mg/dL (ref 0.44–1.00)
Chloride: 111 mmol/L (ref 101–111)
GFR calc non Af Amer: >60 mL/min (ref >60)
Glucose, Bld: 149 mg/dL — ABNORMAL HIGH (ref 65–99)
Phosphorus: 2.2 mg/dL — ABNORMAL LOW (ref 2.5–4.6)
Potassium: 3.5 mmol/L (ref 3.5–5.1)
SODIUM: 148 mmol/L — AB (ref 135–145)

## 2015-05-19 LAB — MAGNESIUM: Magnesium: 1.5 mg/dL — ABNORMAL LOW (ref 1.7–2.4)

## 2015-05-19 MED ORDER — CLONAZEPAM 0.125 MG PO TBDP
0.5000 mg | ORAL_TABLET | Freq: Three times a day (TID) | ORAL | Status: DC
  Administered 2015-05-19 – 2015-05-25 (×16): 0.5 mg
  Filled 2015-05-19: qty 4
  Filled 2015-05-19 (×2): qty 1
  Filled 2015-05-19 (×2): qty 4
  Filled 2015-05-19 (×4): qty 1
  Filled 2015-05-19: qty 4
  Filled 2015-05-19 (×2): qty 1
  Filled 2015-05-19 (×3): qty 4
  Filled 2015-05-19 (×2): qty 1

## 2015-05-19 MED ORDER — CHLORHEXIDINE GLUCONATE 0.12 % MT SOLN
OROMUCOSAL | Status: AC
  Filled 2015-05-19: qty 15

## 2015-05-19 MED ORDER — ANTISEPTIC ORAL RINSE SOLUTION (CORINZ)
7.0000 mL | Freq: Four times a day (QID) | OROMUCOSAL | Status: DC
  Administered 2015-05-20 – 2015-05-22 (×9): 7 mL via OROMUCOSAL

## 2015-05-19 MED ORDER — CHLORHEXIDINE GLUCONATE 0.12% ORAL RINSE (MEDLINE KIT)
15.0000 mL | Freq: Two times a day (BID) | OROMUCOSAL | Status: DC
  Administered 2015-05-19 – 2015-05-21 (×4): 15 mL via OROMUCOSAL

## 2015-05-19 MED ORDER — MAGNESIUM SULFATE 2 GM/50ML IV SOLN
2.0000 g | Freq: Once | INTRAVENOUS | Status: AC
  Administered 2015-05-19: 2 g via INTRAVENOUS
  Filled 2015-05-19: qty 50

## 2015-05-19 MED ORDER — POTASSIUM PHOSPHATES 15 MMOLE/5ML IV SOLN
40.0000 meq | Freq: Once | INTRAVENOUS | Status: AC
  Administered 2015-05-19: 40 meq via INTRAVENOUS
  Filled 2015-05-19: qty 9.09

## 2015-05-19 MED ORDER — CLONAZEPAM 0.1 MG/ML ORAL SUSPENSION: 0.5000 mg | Freq: Three times a day (TID) | ORAL | Status: DC

## 2015-05-19 NOTE — Progress Notes (Signed)
PULMONARY / CRITICAL CARE MEDICINE   Name: Jenny Branch MRN: [id number] DOB: 04/26/1955    ADMISSION DATE:  05/09/2015 CONSULTATION DATE:  05/09/15  REFERRING MD:  Dr. Doyle Askew   CHIEF COMPLAINT:  Respiratory Distress / AECOPD   BRIEF:  60 y/o female followed by BQ in the pulmonary office with COPD and ongoing tobacco use who was intubated on 3/9 for an AE of COPD. Extubated 3/14, delirium and BIPAP 3/15.  Re-intubated 3/16     SUBJECTIVE: No acute events overnight. Patient weaned on sedation this morning but reported as though she felt she was going to die and couldn't breathe well signifying disease with a head nod. She did appear in moderate discomfort and distress on ventilatory support despite normal oxygen and hemodynamic parameters.  REVIEW OF SYSTEMS: Unable to obtain given intubation and sedation.  VITAL SIGNS: BP 155/86 mmHg  Pulse 72  Temp(Src) 99.3 F (37.4 C) (Core (Comment))  Resp 16  Ht 5\' 3"  (1.6 m)  Wt 177 lb 4 oz (80.4 kg)  BMI 31.41 kg/m2  SpO2 99%  HEMODYNAMICS:    VENTILATOR SETTINGS: Vent Mode:  [-] PRVC FiO2 (%):  [30 %] 30 % Set Rate:  [16 bmp] 16 bmp Vt Set:  [500 mL] 500 mL PEEP:  [5 cmH20] 5 cmH20 Pressure Support:  [8 cmH20] 8 cmH20 Plateau Pressure:  [22 cmH20-28 cmH20] 22 cmH20  INTAKE / OUTPUT: I/O last 3 completed shifts: In: 7345.4 [I.V.:5368.1; NG/GT:1327.3; IV Piggyback:650] Out: [id number] [Urine:1740]  PHYSICAL EXAMINATION: General:  Awake. Alert. Significant agitation. Sitting up in bed.  Integument:  Warm & dry. No rash on exposed skin.  HEENT:  No scleral injection or icterus. Endotracheal tube in place.  Cardiovascular:  Regular rate. No edema. No appreciable JVD.  Pulmonary:  Coarse breath sounds bilaterally. Symmetric chest rise on ventilator. Abdomen: Soft. Normal bowel sounds. Nondistended. Neurological: Moving all 4 extremities equally. Nondistended questions. Appears grossly nonfocal.   LABS:  BMET  Recent Labs Lab  05/17/15 0333 05/18/15 0133 05/19/15 0455  NA 147* 150* 148*  K 4.0 3.2* 3.5  CL 105 108 111  CO2 27 33* 32  BUN 20 22* 21*  CREATININE 0.64 0.64 0.53  GLUCOSE 95 132* 149*   Electrolytes  Recent Labs Lab 05/17/15 0332 05/17/15 0333 05/18/15 0133 05/19/15 0455  CALCIUM  --  9.1 8.9 9.0  MG 1.9  --  1.9 1.5*  PHOS  --  3.5 2.5 2.2*   CBC  Recent Labs Lab 05/17/15 0629 05/18/15 0133 05/19/15 0455  WBC 21.4* 10.8* 17.1*  HGB 15.2* 13.0 13.6  HCT 44.8 40.4 40.3  PLT 293 207 261   Coag's No results for input(s): APTT, INR in the last 168 hours.  Sepsis Markers No results for input(s): LATICACIDVEN, PROCALCITON, O2SATVEN in the last 168 hours. ABG  Recent Labs Lab 05/16/15 1126 05/16/15 2030 05/17/15 1422  PHART 7.219* 7.346* 7.368  PCO2ART 71.3* 56.1* 56.4*  PO2ART 73.4* 121* 536*   Liver Enzymes  Recent Labs Lab 05/17/15 0333 05/18/15 0133 05/19/15 0455  ALBUMIN 3.0* 2.8* 2.9*    Cardiac Enzymes No results for input(s): TROPONINI, PROBNP in the last 168 hours.  Glucose  Recent Labs Lab 05/18/15 0739 05/18/15 1150 05/18/15 1723 05/18/15 2034 05/19/15 0058 05/19/15 0416  GLUCAP 175* 169* 193* 159* 137* 124*   STUDIES:  3/9 CXR images personally reviewed: ETT in place, no infiltrate, emphysema noted 3/8 KUB: OG tube in stomach KUB 3/12:  Mild gaseous distension of  right & transverse colon. No obvious obstruction Port CXR 3/13:  ETT 3.5 cm above carina. No focal opacity or effusion appreciated. Heart normal in size EKG 3/14:  Normal sinus rhythm. QTc 479ms EKG 3/14:  QTc 551ms. Sinus tach w/ PVCs Port CXR 3/18: Endotracheal tube in good position. Enteric tube in the stomach. Right PICC line and lower superior vena cava. No focal opacity.  MICROBIOLOGY: Blood Ctx x2 3/14>>> Tracheal Asp Ctx 3/14:  Negative  Urine Ctx 3/14:  Negative  Blood Ctx x2 3/11:  Coag Neg Staph 1/2 Tracheal Asp Ctx 3/11:  Oral Flora Urine Ctx 3/11:  E coli Flu  PCR 3/8 nasal: neg Flu PCR 3/9 trach asp: neg  ANTIBIOTICS: Imipenem 3/14>>> Vancomycin 3/11- 3/15  Doxycycline 3/8 - 3/11  Levaquin 3/11 - 3/14  SIGNIFICANT EVENTS: 3/08  Admit with COPD exacerbation, intubated 3/10  Intermittent agitation, concern for ETOH w/d on precedex  3/14  Extubation & runs of SVT 3/16  Reintubation  LINES/TUBES: Foley 3/8>> PIV  OETT 7.5 3/8 - 3/14, 3/16 > OGT 3/8 - 3/14, 3/16 > R PICC 3/16 >    ASSESSMENT / PLAN:  PULMONARY A: Acute on Chronic Hypercarbic Respiratory Failure - Failing SBT. COPD Exacerbation - Improving. Baseline GOLD D, FEV1 41% pred Ongoing Tobacco Use  P:   Continue Solu-Medrol 40mg  daily IV Pressure support as much as tolerated during daytime Tracheostomy planned Monday at 1pm with Dr. Nelda Marseille if not extubated over weekend Continue bronchodilators as ordered Continue nicotine patch  NEUROLOGIC A:   Acute encephalopathy - Delirium vs EtOH/Benzo withdrawal/dependence  H/O EtOH use H/O Anxiety - severe here when awake  P:   Continue precedex  Fentanyl infusion (was requiring q2h intermittent pushes) Thiamine & Folic Acid IV daily Versed IV when necessary Restarting home Klonopin 0.5 mg via tube every 8 hour Nicotine patch  CARDIOVASCULAR A:  Hypertension QT Prolongation - Resolved. Tachycardia - SVT  P:  Tele monitoring Continue scheduled metoprolol Hydralazine IV prn Continue Catapres Patch  RENAL A:   Hypernatremia - Mild. Hypokalemia - Replacing Hypomagnesemia - Replacing Hypophosphatemia - Replacing.  P:   KPhos 77mEq IV Magnesium Sulfate 2gm IV Monitoring UOP Trending electrolytes & renal function daily Replace electrolytes as needed  GASTROINTESTINAL A:   Ileus - Improving.  P:   Protonix IV daily Continue tube feedings  HEMATOLOGIC A:   Macrocytosis - Due to EtOH Leukocytosis - Unclear etiology.  P:  Trending cell counts daily w/ CBC Heparin [place] q8hr SCDs  INFECTIOUS A:    Acute Bronchitis E coli UTI FUO - New AM 3/14, persistent > withdrawal? Related to UTI?  P:   Awaiting final blood cultures Plan to re-culture for fever  ENDOCRINE A:   Hyperglycemia  - Improved  P:   Accu-Checks q4hr SSI per Moderate Algorithm  FAMILY  - Updates: Uncle updated at bedside 3/18 by Dr. Milinda Hirschfeld.  TODAY'S SUMMARY:  60 y/o female with advanced COPD who is still smoking and has a problem with alcohol abuse admitted on 3/8 with a COPD exacerbation requiring extubation.  Had UTI on hospital day 3.  Extubated on 3/14 but within 24 hours developed recurrent fever, tachycardia, hypertension, diaphoresis, delirium and worsening tachypnea.  On 3/16 she was re-intubated. Agitation continues to be significant and the patient is intolerant of spontaneous breathing trials. Restarting home Klonopin in an effort to help patient's high degree of anxiety. Continuing to plan for tracheostomy on Monday.  I have spent a total of 36 minutes  of critical care time today caring for the patient, updating the patient's uncle at bedside, and reviewing the patient's electronic medical record.  Sonia Baller Ashok Cordia, M.D. [facility]monary [facility] Critical [facility] Pager:  [phone] After 3pm or if no response, call [phone]  05/19/2015, 8:16 AM

## 2015-05-19 NOTE — Progress Notes (Signed)
[place] Progress Note Patient Name: Jenny Branch DOB: [birth year] MRN: [id number]   Date of Service  05/19/2015  HPI/Events of Note  K 3.5, Mg 1.5, P 2.2  eICU Interventions  Repleted     Intervention Category Intermediate Interventions: Electrolyte abnormality - evaluation and management  [place] Markey 05/19/2015, 6:03 AM

## 2015-05-20 DIAGNOSIS — E876 Hypokalemia: Secondary | ICD-10-CM

## 2015-05-20 DIAGNOSIS — E870 Hyperosmolality and hypernatremia: Secondary | ICD-10-CM

## 2015-05-20 LAB — CBC WITH DIFFERENTIAL/PLATELET
BASOS ABS: 0 10*3/uL (ref 0.0–0.1)
Basophils Relative: 0 %
EOS ABS: 0 10*3/uL (ref 0.0–0.7)
EOS PCT: 0 %
HCT: 40.8 % (ref 36.0–46.0)
Hemoglobin: 13.2 g/dL (ref 12.0–15.0)
LYMPHS ABS: 1.2 10*3/uL (ref 0.7–4.0)
Lymphocytes Relative: 8 %
MCH: 35.4 pg — AB (ref 26.0–34.0)
MCHC: 32.4 g/dL (ref 30.0–36.0)
MCV: 109.4 fL — ABNORMAL HIGH (ref 78.0–100.0)
Monocytes Absolute: 1 10*3/uL (ref 0.1–1.0)
Monocytes Relative: 7 %
Neutro Abs: 12.2 10*3/uL — ABNORMAL HIGH (ref 1.7–7.7)
Neutrophils Relative %: 85 %
PLATELETS: 253 10*3/uL (ref 150–400)
RBC: 3.73 MIL/uL — AB (ref 3.87–5.11)
RDW: 15.6 % — ABNORMAL HIGH (ref 11.5–15.5)
WBC: 14.5 10*3/uL — AB (ref 4.0–10.5)

## 2015-05-20 LAB — GLUCOSE, CAPILLARY
GLUCOSE-CAPILLARY: 104 mg/dL — AB (ref 65–99)
GLUCOSE-CAPILLARY: 112 mg/dL — AB (ref 65–99)
GLUCOSE-CAPILLARY: 150 mg/dL — AB (ref 65–99)
Glucose-Capillary: 150 mg/dL — ABNORMAL HIGH (ref 65–99)
Glucose-Capillary: 121 mg/dL — ABNORMAL HIGH (ref 65–99)
Glucose-Capillary: 127 mg/dL — ABNORMAL HIGH (ref 65–99)
Glucose-Capillary: 169 mg/dL — ABNORMAL HIGH (ref 65–99)

## 2015-05-20 LAB — RENAL FUNCTION PANEL
Albumin: 2.8 g/dL — ABNORMAL LOW (ref 3.5–5.0)
Anion gap: 8 (ref 5–15)
BUN: 17 mg/dL (ref 6–20)
CALCIUM: 8.8 mg/dL — AB (ref 8.9–10.3)
CO2: 30 mmol/L (ref 22–32)
CREATININE: 0.48 mg/dL (ref 0.44–1.00)
Chloride: 112 mmol/L — ABNORMAL HIGH (ref 101–111)
Glucose, Bld: 141 mg/dL — ABNORMAL HIGH (ref 65–99)
Phosphorus: 4.3 mg/dL (ref 2.5–4.6)
Potassium: 3.2 mmol/L — ABNORMAL LOW (ref 3.5–5.1)
SODIUM: 150 mmol/L — AB (ref 135–145)

## 2015-05-20 LAB — CULTURE, BLOOD (ROUTINE X 2)
CULTURE: NO GROWTH
Culture: NO GROWTH

## 2015-05-20 LAB — MAGNESIUM: MAGNESIUM: 1.8 mg/dL (ref 1.7–2.4)

## 2015-05-20 MED ORDER — FREE WATER
200.0000 mL | Freq: Three times a day (TID) | Status: DC
  Administered 2015-05-20 – 2015-05-21 (×3): 200 mL

## 2015-05-20 MED ORDER — SENNOSIDES 8.8 MG/5ML PO SYRP
5.0000 mL | ORAL_SOLUTION | Freq: Two times a day (BID) | ORAL | Status: DC
  Administered 2015-05-20 (×2): 5 mL via ORAL
  Filled 2015-05-20 (×13): qty 5

## 2015-05-20 MED ORDER — POTASSIUM CHLORIDE 20 MEQ/15ML (10%) PO SOLN
40.0000 meq | Freq: Once | ORAL | Status: AC
  Administered 2015-05-20: 40 meq
  Filled 2015-05-20: qty 30

## 2015-05-20 NOTE — Progress Notes (Signed)
PULMONARY / CRITICAL CARE MEDICINE   Name: Jenny Branch MRN: [id number] DOB: Jan 18, 1956    ADMISSION DATE:  05/09/2015 CONSULTATION DATE:  05/09/15  REFERRING MD:  Dr. Doyle Askew   CHIEF COMPLAINT:  Respiratory Distress / AECOPD   BRIEF:  60 y/o female followed by BQ in the pulmonary office with COPD and ongoing tobacco use who was intubated on 3/9 for an AE of COPD. Extubated 3/14, delirium and BIPAP 3/15.  Re-intubated 3/16     SUBJECTIVE: No acute events overnight. Patient more calm and comfortable today on pressure support 5/5. Denies any difficulty breathing or pain at this time with a head nod.  REVIEW OF SYSTEMS: Unable to obtain given intubation and sedation.  VITAL SIGNS: BP 147/94 mmHg  Pulse 133  Temp(Src) 99 F (37.2 C) (Core (Comment))  Resp 29  Ht 5\' 3"  (1.6 m)  Wt 87.5 kg (192 lb 14.4 oz)  BMI 34.18 kg/m2  SpO2 94%  HEMODYNAMICS:    VENTILATOR SETTINGS: Vent Mode:  [-] PSV FiO2 (%):  [30 %] 30 % Set Rate:  [16 bmp] 16 bmp Vt Set:  [500 mL] 500 mL PEEP:  [5 cmH20] 5 cmH20 Pressure Support:  [5 cmH20] 5 cmH20 Plateau Pressure:  [21 cmH20-27 cmH20] 26 cmH20  INTAKE / OUTPUT: I/O last 3 completed shifts: In: 7676.1 [I.V.:5416.1; NG/GT:1440; IV Piggyback:820] Out: 1875 [id number]  PHYSICAL EXAMINATION: General:  Awake. Alert. Comfortable. Eyes closed on room entry. Integument:  Warm & dry. No rash on exposed skin.  HEENT:  No scleral injection. Endotracheal tube in place.  Cardiovascular:  Regular rhythm. No edema. No appreciable JVD.  Pulmonary:  Coarse breath sounds bilaterally unchanged. Symmetric chest rise on ventilator. Abdomen: Soft. Normal bowel sounds. Nondistended. Neurological: Moving all 4 extremities equally. Nods to questions. Appears grossly nonfocal.   LABS:  BMET  Recent Labs Lab 05/18/15 0133 05/19/15 0455 05/20/15 0500  NA 150* 148* 150*  K 3.2* 3.5 3.2*  CL 108 111 112*  CO2 33* 32 30  BUN 22* 21* 17  CREATININE 0.64 0.53  0.48  GLUCOSE 132* 149* 141*   Electrolytes  Recent Labs Lab 05/18/15 0133 05/19/15 0455 05/20/15 0500  CALCIUM 8.9 9.0 8.8*  MG 1.9 1.5* 1.8  PHOS 2.5 2.2* 4.3   CBC  Recent Labs Lab 05/18/15 0133 05/19/15 0455 05/20/15 0500  WBC 10.8* 17.1* 14.5*  HGB 13.0 13.6 13.2  HCT 40.4 40.3 40.8  PLT 207 261 253   Coag's No results for input(s): APTT, INR in the last 168 hours.  Sepsis Markers No results for input(s): LATICACIDVEN, PROCALCITON, O2SATVEN in the last 168 hours. ABG  Recent Labs Lab 05/16/15 1126 05/16/15 2030 05/17/15 1422  PHART 7.219* 7.346* 7.368  PCO2ART 71.3* 56.1* 56.4*  PO2ART 73.4* 121* 536*   Liver Enzymes  Recent Labs Lab 05/18/15 0133 05/19/15 0455 05/20/15 0500  ALBUMIN 2.8* 2.9* 2.8*    Cardiac Enzymes No results for input(s): TROPONINI, PROBNP in the last 168 hours.  Glucose  Recent Labs Lab 05/19/15 1225 05/19/15 1618 05/19/15 2144 05/20/15 0023 05/20/15 0321 05/20/15 0749  GLUCAP 170* 180* 150* 104* 121* 112*   STUDIES:  3/9 CXR images personally reviewed: ETT in place, no infiltrate, emphysema noted 3/8 KUB: OG tube in stomach KUB 3/12:  Mild gaseous distension of right & transverse colon. No obvious obstruction Port CXR 3/13:  ETT 3.5 cm above carina. No focal opacity or effusion appreciated. Heart normal in size EKG 3/14:  Normal sinus rhythm. QTc  433ms EKG 3/14:  QTc 553ms. Sinus tach w/ PVCs Port CXR 3/18: Endotracheal tube in good position. Enteric tube in the stomach. Right PICC line and lower superior vena cava. No focal opacity.  MICROBIOLOGY: Blood Ctx x2 3/14>>> Tracheal Asp Ctx 3/14:  Candida species Urine Ctx 3/14:  Negative  Blood Ctx x2 3/11:  Coag Neg Staph 1/2 Tracheal Asp Ctx 3/11:  Oral Flora Urine Ctx 3/11:  E coli Flu PCR 3/8 nasal: neg Flu PCR 3/9 trach asp: neg  ANTIBIOTICS: Imipenem 3/14>>> Vancomycin 3/11- 3/15  Doxycycline 3/8 - 3/11  Levaquin 3/11 - 3/14  SIGNIFICANT  EVENTS: 3/08  Admit with COPD exacerbation, intubated 3/10  Intermittent agitation, concern for ETOH w/d on precedex  3/14  Extubation & runs of SVT 3/16  Reintubation  LINES/TUBES: Foley 3/8>> PIV  OETT 7.5 3/8 - 3/14, 3/16 > OGT 3/8 - 3/14, 3/16 > R PICC 3/16 >   ASSESSMENT / PLAN:  PULMONARY A: Acute on Chronic Hypercarbic Respiratory Failure - Failing SBT. COPD Exacerbation - Improving. Baseline GOLD D, FEV1 41% pred Ongoing Tobacco Use  P:   Continue Solu-Medrol 40mg  daily IV Pressure support as much as tolerated during daytime Tracheostomy planned Monday at 1pm with Dr. Nelda Marseille  Continue bronchodilators as ordered Continue nicotine patch SBT in AM  NEUROLOGIC A:   Acute encephalopathy - Delirium vs EtOH/Benzo withdrawal/dependence  H/O EtOH use H/O Anxiety - severe here when awake  P:   Continue precedex  Fentanyl infusion (was requiring q2h intermittent pushes) Thiamine & Folic Acid IV daily Versed IV when necessary Klonopin 0.5 mg via tube every 8 hour Nicotine patch  CARDIOVASCULAR A:  Hypertension QT Prolongation - Resolved. Tachycardia - SVT  P:  Tele monitoring Continue scheduled metoprolol Hydralazine IV prn Continue Catapres Patch  RENAL A:   Hypernatremia - Mild. Hypokalemia - Mild.  Hypomagnesemia - Resolved. Hypophosphatemia - Resolved.  P:   KCl 38mEq VT once [employer] 200cc VT q8hr Monitoring UOP Trending electrolytes & renal function daily Replace electrolytes as needed  GASTROINTESTINAL A:   Ileus - Improving.  P:   Protonix IV daily Continue tube feedings Senna bid  HEMATOLOGIC A:   Macrocytosis - Due to EtOH Leukocytosis - Unclear etiology.  P:  Trending cell counts daily w/ CBC Heparin [place] q8hr SCDs  INFECTIOUS A:   Acute Bronchitis E coli UTI FUO - New AM 3/14, persistent > withdrawal? Related to UTI?  P:   Awaiting final blood cultures Plan to re-culture for fever See Abx  above  ENDOCRINE A:   Hyperglycemia  - Improved.  P:   Accu-Checks q4hr SSI per Moderate Algorithm  FAMILY  - Updates: Uncle updated at bedside 3/18 by Dr. Ashok Cordia.  TODAY'S SUMMARY:  60 y/o female with advanced COPD who is still smoking and has a problem with alcohol abuse admitted on 3/8 with a COPD exacerbation requiring extubation.  Had UTI on hospital day 3.  Extubated on 3/14 but within 24 hours developed recurrent fever, tachycardia, hypertension, diaphoresis, delirium and worsening tachypnea.  On 3/16 she was re-intubated. Patient's degree of agitation significantly improved with initiating Klonopin yesterday. Plan for spontaneous breathing trial in the morning and if successful would recommend considering extubation versus tracheostomy in the afternoon.  I have spent a total of 35 minutes of critical care time today caring for the patient and reviewing the patient's electronic medical record.  Sonia Baller Ashok Cordia, M.D. [facility] Pulmonary & Critical Care Pager:  [phone] After 3pm or  if no response, call [phone]  05/20/2015, 11:48 AM

## 2015-05-21 ENCOUNTER — Encounter (HOSPITAL_COMMUNITY): Payer: Medicare Other

## 2015-05-21 LAB — CBC WITH DIFFERENTIAL/PLATELET
Basophils Absolute: 0 10*3/uL (ref 0.0–0.1)
Basophils Relative: 0 %
Eosinophils Absolute: 0 10*3/uL (ref 0.0–0.7)
Eosinophils Relative: 0 %
HCT: 39.5 % (ref 36.0–46.0)
HEMOGLOBIN: 12.8 g/dL (ref 12.0–15.0)
LYMPHS ABS: 1.2 10*3/uL (ref 0.7–4.0)
LYMPHS PCT: 8 %
MCH: 35.3 pg — AB (ref 26.0–34.0)
MCHC: 32.4 g/dL (ref 30.0–36.0)
MCV: 108.8 fL — AB (ref 78.0–100.0)
Monocytes Absolute: 1 10*3/uL (ref 0.1–1.0)
Monocytes Relative: 7 %
NEUTROS ABS: 12.1 10*3/uL — AB (ref 1.7–7.7)
NEUTROS PCT: 85 %
Platelets: 232 10*3/uL (ref 150–400)
RBC: 3.63 MIL/uL — AB (ref 3.87–5.11)
RDW: 15.2 % (ref 11.5–15.5)
WBC: 14.3 10*3/uL — AB (ref 4.0–10.5)

## 2015-05-21 LAB — GLUCOSE, CAPILLARY
GLUCOSE-CAPILLARY: 129 mg/dL — AB (ref 65–99)
GLUCOSE-CAPILLARY: 119 mg/dL — AB (ref 65–99)
GLUCOSE-CAPILLARY: 104 mg/dL — AB (ref 65–99)
Glucose-Capillary: 137 mg/dL — ABNORMAL HIGH (ref 65–99)
Glucose-Capillary: 145 mg/dL — ABNORMAL HIGH (ref 65–99)
Glucose-Capillary: 148 mg/dL — ABNORMAL HIGH (ref 65–99)

## 2015-05-21 LAB — RENAL FUNCTION PANEL
ANION GAP: 8 (ref 5–15)
Albumin: 2.9 g/dL — ABNORMAL LOW (ref 3.5–5.0)
BUN: 17 mg/dL (ref 6–20)
CHLORIDE: 111 mmol/L (ref 101–111)
CO2: 29 mmol/L (ref 22–32)
Calcium: 8.9 mg/dL (ref 8.9–10.3)
Creatinine, Ser: 0.48 mg/dL (ref 0.44–1.00)
GFR calc non Af Amer: >60 mL/min (ref >60)
Glucose, Bld: 138 mg/dL — ABNORMAL HIGH (ref 65–99)
Phosphorus: 4 mg/dL (ref 2.5–4.6)
Potassium: 3.7 mmol/L (ref 3.5–5.1)
Sodium: 148 mmol/L — ABNORMAL HIGH (ref 135–145)

## 2015-05-21 LAB — MAGNESIUM: Magnesium: 1.8 mg/dL (ref 1.7–2.4)

## 2015-05-21 MED ORDER — FREE WATER: 200.0000 mL | Freq: Four times a day (QID) | Status: DC

## 2015-05-21 MED ORDER — TRAZODONE HCL 50 MG PO TABS: 50.0000 mg | ORAL_TABLET | Freq: Every evening | ORAL | Status: DC | PRN

## 2015-05-21 MED ORDER — DEXMEDETOMIDINE HCL IN NACL 200 MCG/50ML IV SOLN
0.4000 ug/kg/h | INTRAVENOUS | Status: DC
  Administered 2015-05-21 (×3): 1.8 ug/kg/h via INTRAVENOUS
  Administered 2015-05-21: 0.9 ug/kg/h via INTRAVENOUS
  Administered 2015-05-21: 1.8 ug/kg/h via INTRAVENOUS
  Filled 2015-05-21 (×7): qty 50

## 2015-05-21 MED ORDER — MAGNESIUM SULFATE 2 GM/50ML IV SOLN
2.0000 g | Freq: Once | INTRAVENOUS | Status: AC
  Administered 2015-05-21: 2 g via INTRAVENOUS
  Filled 2015-05-21: qty 50

## 2015-05-21 MED ORDER — PAROXETINE HCL 20 MG PO TABS
20.0000 mg | ORAL_TABLET | Freq: Every day | ORAL | Status: DC
  Administered 2015-05-21 – 2015-05-25 (×5): 20 mg via ORAL
  Filled 2015-05-21 (×5): qty 1

## 2015-05-21 MED ORDER — PREDNISONE 5 MG/ML PO CONC
40.0000 mg | Freq: Every day | ORAL | Status: DC
  Administered 2015-05-21: 40 mg
  Filled 2015-05-21 (×2): qty 8

## 2015-05-21 NOTE — Progress Notes (Signed)
Patient extubated per order; cuff leak noted. Patient extubated to [place] @ 2 L. Patient tolerated well and no complications noted at this time. No complaints of SOB. VS are stable. RT will continue to monitor patient.

## 2015-05-21 NOTE — Progress Notes (Signed)
PULMONARY / CRITICAL CARE MEDICINE   Name: Jenny Branch MRN: [id number] DOB: Jul 31, 1955    ADMISSION DATE:  05/09/2015 CONSULTATION DATE:  05/09/15  REFERRING MD:  Dr. Doyle Askew   CHIEF COMPLAINT:  Respiratory Distress / AECOPD   BRIEF:  60 y/o female followed by BQ in the pulmonary office with COPD and ongoing tobacco use who was intubated on 3/9 for an AE of COPD. Extubated 3/14, delirium and BIPAP 3/15.  Re-intubated 3/16     SUBJECTIVE: No acute events overnight. Patient more calm and comfortable today on pressure support 5/5. Denies any difficulty breathing or pain at this time with a head nod.  REVIEW OF SYSTEMS: Unable to obtain given intubation and sedation.  VITAL SIGNS: BP 138/88 mmHg  Pulse 110  Temp(Src) 98.8 F (37.1 C) (Core (Comment))  Resp 19  Ht 5\' 3"  (1.6 m)  Wt 195 lb 15.8 oz (88.9 kg)  BMI 34.73 kg/m2  SpO2 100%  HEMODYNAMICS:    VENTILATOR SETTINGS: Vent Mode:  [-] PSV;CPAP FiO2 (%):  [30 %] 30 % Set Rate:  [16 bmp] 16 bmp Vt Set:  [500 mL] 500 mL PEEP:  [5 cmH20] 5 cmH20 Pressure Support:  [5 cmH20-8 cmH20] 5 cmH20 Plateau Pressure:  [24 cmH20-26 cmH20] 24 cmH20  INTAKE / OUTPUT: I/O last 3 completed shifts: In: 8112.1 [I.V.:5422.1; NG/GT:2090; IV Piggyback:600] Out: [id number] [Urine:1375]  PHYSICAL EXAMINATION: General:  Awake. Alert. Comfortable. Weaning  Integument:  Warm & dry. No rash on exposed skin.  HEENT:  No scleral injection. Endotracheal tube in place.  Cardiovascular:  Regular rhythm. No edema. No appreciable JVD.  Pulmonary:  Clear breath sounds bilaterally unchanged. Symmetric chest rise on ventilator. Excellent F/Vt  Abdomen: Soft. Normal bowel sounds. Nondistended. Neurological: Moving all 4 extremities equally. Nods to questions. Appears grossly nonfocal.   LABS:  BMET  Recent Labs Lab 05/19/15 0455 05/20/15 0500 05/21/15 0355  NA 148* 150* 148*  K 3.5 3.2* 3.7  CL 111 112* 111  CO2 32 30 29  BUN 21* 17 17  CREATININE  0.53 0.48 0.48  GLUCOSE 149* 141* 138*   Electrolytes  Recent Labs Lab 05/19/15 0455 05/20/15 0500 05/21/15 0354 05/21/15 0355  CALCIUM 9.0 8.8*  --  8.9  MG 1.5* 1.8 1.8  --   PHOS 2.2* 4.3  --  4.0   CBC  Recent Labs Lab 05/19/15 0455 05/20/15 0500 05/21/15 0354  WBC 17.1* 14.5* 14.3*  HGB 13.6 13.2 12.8  HCT 40.3 40.8 39.5  PLT 261 253 232   Coag's No results for input(s): APTT, INR in the last 168 hours.  Sepsis Markers No results for input(s): LATICACIDVEN, PROCALCITON, O2SATVEN in the last 168 hours. ABG  Recent Labs Lab 05/16/15 1126 05/16/15 2030 05/17/15 1422  PHART 7.219* 7.346* 7.368  PCO2ART 71.3* 56.1* 56.4*  PO2ART 73.4* 121* 536*   Liver Enzymes  Recent Labs Lab 05/19/15 0455 05/20/15 0500 05/21/15 0355  ALBUMIN 2.9* 2.8* 2.9*    Cardiac Enzymes No results for input(s): TROPONINI, PROBNP in the last 168 hours.  Glucose  Recent Labs Lab 05/20/15 0749 05/20/15 1146 05/20/15 1655 05/20/15 1947 05/20/15 2336 05/21/15 0357  GLUCAP 112* 127* 169* 150* 137* 129*   STUDIES:  3/9 CXR images personally reviewed: ETT in place, no infiltrate, emphysema noted 3/8 KUB: OG tube in stomach KUB 3/12:  Mild gaseous distension of right & transverse colon. No obvious obstruction Port CXR 3/13:  ETT 3.5 cm above carina. No focal opacity or effusion  appreciated. Heart normal in size EKG 3/14:  Normal sinus rhythm. QTc 420ms EKG 3/14:  QTc 572ms. Sinus tach w/ PVCs Port CXR 3/18: Endotracheal tube in good position. Enteric tube in the stomach. Right PICC line and lower superior vena cava. No focal opacity.  MICROBIOLOGY: Blood Ctx x2 3/14>>> Tracheal Asp Ctx 3/14:  Candida species Urine Ctx 3/14:  Negative  Blood Ctx x2 3/11:  Coag Neg Staph 1/2 Tracheal Asp Ctx 3/11:  Oral Flora Urine Ctx 3/11:  E coli Flu PCR 3/8 nasal: neg Flu PCR 3/9 trach asp: neg  ANTIBIOTICS: Imipenem 3/14>>> Vancomycin 3/11- 3/15  Doxycycline 3/8 - 3/11   Levaquin 3/11 - 3/14  SIGNIFICANT EVENTS: 3/08  Admit with COPD exacerbation, intubated 3/10  Intermittent agitation, concern for ETOH w/d on precedex  3/14  Extubation & runs of SVT 3/16  Reintubation-->delirium vs w/d 3/20: extubated  LINES/TUBES: Foley 3/8>> PIV  OETT 7.5 3/8 - 3/14, 3/16 > OGT 3/8 - 3/14, 3/16 > R PICC 3/16 >   ASSESSMENT / PLAN:  PULMONARY A: Acute on Chronic Hypercarbic Respiratory Failure - COPD Exacerbation - Improving. Baseline GOLD D, FEV1 41% pred Ongoing Tobacco Use ->passed SBT. Possibly element of delirium vs w/d barrier. Now better on Clonaz and precedex.  P:   Extubate Taper steroids Continue bronchodilators as ordered Continue nicotine patch  NEUROLOGIC A:   Acute encephalopathy - Delirium vs EtOH/Benzo withdrawal/dependence  H/O EtOH use H/O Anxiety - severe here when awake  P:   Continue precedex s/p extubation  Dc fentanyl  Cont Thiamine & Folic Acid IV daily Klonopin 0.5 mg via tube every 8 hour  Add back paxil and HS trazodone Nicotine patch  CARDIOVASCULAR A:  Hypertension QT Prolongation - Resolved. Tachycardia - SVT  P:  Tele monitoring Continue scheduled metoprolol Hydralazine IV prn Continue Catapres Patch  RENAL A:   Hypernatremia - Mild. P:   Encourage free water s/p extubation  Add D5w @ 50   GASTROINTESTINAL A:   Ileus - Improving.  P:   Protonix IV daily Continue tube feedings Senna bid  HEMATOLOGIC A:   Macrocytosis - Due to EtOH Leukocytosis - Unclear etiology.  P:  Trending cell counts daily w/ CBC Heparin [place] q8hr SCDs  INFECTIOUS A:   Acute Bronchitis E coli UTI FUO - New AM 3/14, persistent > withdrawal? Related to UTI? CNS 1/2 BC  P:   Awaiting final blood cultures Plan to re-culture for fever See Abx above; will d/c on 3/21 assuming no new culture data   ENDOCRINE A:   Hyperglycemia  - Improved.  P:   Accu-Checks q4hr SSI per Moderate Algorithm  FAMILY  -  Updates: Uncle updated at bedside 3/18 by Dr. Ashok Cordia.  TODAY'S SUMMARY:  60 y/o female with advanced COPD who is still smoking and has a problem with alcohol abuse admitted on 3/8 with a COPD exacerbation requiring intubation.   Had UTI on hospital day 3.  Extubated on 3/14 but within 24 hours developed recurrent fever, tachycardia, hypertension, diaphoresis, delirium and worsening tachypnea.  On 3/16 she was re-intubated. Patient's degree of agitation significantly improved with initiating Klonopin 3/19. Now ready for extubation.   Erick Colace ACNP-BC [place] Pager # [phone] OR # [phone] if no answer

## 2015-05-21 NOTE — Progress Notes (Signed)
Pt alert, oriented to person, place; reoriented to time. Able to follow commands and  answer questions by writing. Asked who would sign her consents, pt wrote her sister Marlowe Aschoff and friend Saddie Benders. Pt wrote poa and then pointed to paper  and  nodded when undersigned RN asked if there are legal papers saying they were poa.  Attempted to contact Marlowe Aschoff and phone answered at pt mothers home. Pt mother Winferd Humphrey reported Magda Paganini was in the ICU at [facility]. Mother did not have any knowledge of any POA papers. Mother asked for this Rn not to tell pt her sister, Magda Paganini was in the hospital at this time. Will honor this request and place consult for social work to follow up on possible POA.

## 2015-05-21 NOTE — Progress Notes (Signed)
50 mL of Fentanyl gtt wasted in sink with Penni Bombard

## 2015-05-21 NOTE — Progress Notes (Signed)
Pharmacy Antibiotic Note  Jenny Branch is a 60 y.o. female admitted on 05/09/2015 with respiratory distress / AECOPD experiencing severe agitation concerning for EtOH withdrawal.  Pharmacy was initially consulted for Vancomycin and Levaquin dosing for persistent fever and elevated WBC while on doxycycline therapy. Note allergy to penicillin (rash). She spiked fever to 102.2 on this regimen & due to prolonged QTc, Levaquin was changed to Primaxin per RX dosing on 3/14.    05/21/2015:  WBC trending down  Renal function stable  afebrile  Patient extubated today  Currently on day# 7 Primaxin (10 total antibiotic days)  Repeat cx data has been negative  QTc normalized off Levaquin, Haldol  Plan:  Primaxin 500mg  IV q6h.  Follow up renal fxn, culture results, and clinical course.  Noted plan to d/c abx tomorrow if patient remains clinically stable  Height: 5\' 3"  (160 cm) Weight: 195 lb 15.8 oz (88.9 kg) IBW/kg (Calculated) : 52.4  Temp (24hrs), Avg:98.8 F (37.1 C), Min:98.2 F (36.8 C), Max:99.1 F (37.3 C)   Recent Labs Lab 05/15/15 1958  05/17/15 0333 05/17/15 0629 05/18/15 0133 05/19/15 0455 05/20/15 0500 05/21/15 0354 05/21/15 0355  WBC  --   < >  --  21.4* 10.8* 17.1* 14.5* 14.3*  --   CREATININE  --   < > 0.64  --  0.64 0.53 0.48  --  0.48  VANCOTROUGH 13  --   --   --   --   --   --   --   --   < > = values in this interval not displayed.  Estimated Creatinine Clearance: 82.1 mL/min (by C-G formula based on Cr of 0.48).    Allergies  Allergen Reactions  . Penicillins Itching and Rash    Antimicrobials this admission: 3/7 (PTA) doxy >> 3/13 3/11 Vancomcyin >> 3/16 3/11 Levaquin >> 3/14 3/14 >> Primaxin >>  Dose adjustments this admission: 3/14 1930 VT =13, below goal on 1g q12h (prior to 7th dose, previous doses charted on time)  Microbiology results: 3/11 BCx: 1/2 CoNS 3/11 UCx: 50k E coli pansensitive 3/11 trach asp cx: normal flora 3/8 MRSA  PCR: negative Flu PCR: neg 3/14 UCx: NGF 3/14 BCx: ngtd 3/14 trach aspirate: few candida  Thank you for allowing pharmacy to be a part of this patient's care.  Dolly Rias RPh 05/21/2015, 1:19 PM Pager [phone]

## 2015-05-22 LAB — CBC WITH DIFFERENTIAL/PLATELET
BASOS ABS: 0 10*3/uL (ref 0.0–0.1)
BASOS PCT: 0 %
Eosinophils Absolute: 0.1 10*3/uL (ref 0.0–0.7)
Eosinophils Relative: 0 %
HEMATOCRIT: 43.3 % (ref 36.0–46.0)
HEMOGLOBIN: 14.2 g/dL (ref 12.0–15.0)
Lymphocytes Relative: 6 %
Lymphs Abs: 1.3 10*3/uL (ref 0.7–4.0)
MCH: 35.1 pg — ABNORMAL HIGH (ref 26.0–34.0)
MCHC: 32.8 g/dL (ref 30.0–36.0)
MCV: 106.9 fL — ABNORMAL HIGH (ref 78.0–100.0)
Monocytes Absolute: 1.7 10*3/uL — ABNORMAL HIGH (ref 0.1–1.0)
Monocytes Relative: 8 %
NEUTROS ABS: 17.5 10*3/uL — AB (ref 1.7–7.7)
NEUTROS PCT: 86 %
Platelets: 366 10*3/uL (ref 150–400)
RBC: 4.05 MIL/uL (ref 3.87–5.11)
RDW: 15.2 % (ref 11.5–15.5)
WBC: 20.5 10*3/uL — ABNORMAL HIGH (ref 4.0–10.5)

## 2015-05-22 LAB — RENAL FUNCTION PANEL
ALBUMIN: 3.1 g/dL — AB (ref 3.5–5.0)
ANION GAP: 8 (ref 5–15)
BUN: 13 mg/dL (ref 6–20)
CO2: 31 mmol/L (ref 22–32)
Calcium: 9 mg/dL (ref 8.9–10.3)
Chloride: 106 mmol/L (ref 101–111)
Creatinine, Ser: 0.51 mg/dL (ref 0.44–1.00)
Glucose, Bld: 90 mg/dL (ref 65–99)
PHOSPHORUS: 3.8 mg/dL (ref 2.5–4.6)
POTASSIUM: 3.1 mmol/L — AB (ref 3.5–5.1)
Sodium: 145 mmol/L (ref 135–145)

## 2015-05-22 LAB — GLUCOSE, CAPILLARY
GLUCOSE-CAPILLARY: 126 mg/dL — AB (ref 65–99)
GLUCOSE-CAPILLARY: 82 mg/dL (ref 65–99)
GLUCOSE-CAPILLARY: 88 mg/dL (ref 65–99)
Glucose-Capillary: 82 mg/dL (ref 65–99)
Glucose-Capillary: 111 mg/dL — ABNORMAL HIGH (ref 65–99)
Glucose-Capillary: 100 mg/dL — ABNORMAL HIGH (ref 65–99)

## 2015-05-22 LAB — POTASSIUM: Potassium: 3.6 mmol/L (ref 3.5–5.1)

## 2015-05-22 LAB — MAGNESIUM: MAGNESIUM: 1.8 mg/dL (ref 1.7–2.4)

## 2015-05-22 MED ORDER — FUROSEMIDE 10 MG/ML IJ SOLN
20.0000 mg | Freq: Once | INTRAMUSCULAR | Status: AC
  Administered 2015-05-22: 20 mg via INTRAVENOUS
  Filled 2015-05-22: qty 2

## 2015-05-22 MED ORDER — PREDNISONE 5 MG/ML PO CONC
30.0000 mg | Freq: Every day | ORAL | Status: DC
  Administered 2015-05-22: 30 mg via ORAL
  Filled 2015-05-22: qty 6

## 2015-05-22 MED ORDER — LABETALOL HCL 5 MG/ML IV SOLN
10.0000 mg | INTRAVENOUS | Status: DC | PRN
  Administered 2015-05-22: 5 mg via INTRAVENOUS
  Administered 2015-05-23: 10 mg via INTRAVENOUS
  Filled 2015-05-22 (×3): qty 4

## 2015-05-22 MED ORDER — PREDNISONE 20 MG PO TABS
30.0000 mg | ORAL_TABLET | Freq: Every day | ORAL | Status: DC
  Administered 2015-05-23 – 2015-05-25 (×3): 30 mg via ORAL
  Filled 2015-05-22 (×4): qty 1

## 2015-05-22 MED ORDER — LABETALOL HCL 5 MG/ML IV SOLN
10.0000 mg | INTRAVENOUS | Status: DC | PRN
  Filled 2015-05-22: qty 4

## 2015-05-22 MED ORDER — BUDESONIDE 0.5 MG/2ML IN SUSP
RESPIRATORY_TRACT | Status: AC
  Filled 2015-05-22: qty 2

## 2015-05-22 MED ORDER — BUDESONIDE 0.5 MG/2ML IN SUSP
0.5000 mg | Freq: Two times a day (BID) | RESPIRATORY_TRACT | Status: DC
  Administered 2015-05-22 – 2015-05-24 (×4): 0.5 mg via RESPIRATORY_TRACT
  Filled 2015-05-22 (×4): qty 2

## 2015-05-22 MED ORDER — POTASSIUM CHLORIDE 10 MEQ/50ML IV SOLN
10.0000 meq | INTRAVENOUS | Status: AC
  Administered 2015-05-22 (×4): 10 meq via INTRAVENOUS
  Filled 2015-05-22 (×4): qty 50

## 2015-05-22 NOTE — Progress Notes (Signed)
eLink Physician-Brief Progress Note Patient Name: Jenny Branch DOB: 1955/05/11 MRN: [id number]   Date of Service  05/22/2015  HPI/Events of Note  Camera check on patient postextubation. Patient sitting up in bed awake watching TV.  Appears comfortable. Denies any difficulty breathing with a head nod.Noted to be hypertensive.  Patient did receive hydralazine earlier in the evening as well as scheduled Lopressor. Contacted bedside nurse regarding new medication to treat hypertension.  eICU Interventions  1. Continue close monitoring in the intensive care unit for potential reintubation 2. Start labetalol IV when necessary for hypertension     Intervention Category Major Interventions: Respiratory failure - evaluation and management;Hypertension - evaluation and management  Tera Partridge 05/22/2015, 1:32 AM

## 2015-05-22 NOTE — Progress Notes (Signed)
PULMONARY / CRITICAL CARE MEDICINE   Name: Jenny Branch MRN: [id number] DOB: 11-01-1955    ADMISSION DATE:  05/09/2015 CONSULTATION DATE:  05/09/15  REFERRING MD:  Dr. Doyle Askew   CHIEF COMPLAINT:  Respiratory Distress / AECOPD   BRIEF:  60 y/o female followed by BQ in the pulmonary office with COPD and ongoing tobacco use who was intubated on 3/9 for an AE of COPD. Extubated 3/14, delirium and BIPAP 3/15.  Re-intubated 3/16. Extubated on 3/20.      SUBJECTIVE: No issues overnight. Did well on [place]. Was anxious to take BP meds last night.   REVIEW OF SYSTEMS: (+) SOB, cough. (-) cp, abd pain. Generalized weakness. Rest of ROS is (-).  VITAL SIGNS: BP 187/88 mmHg  Pulse 96  Temp(Src) 99.1 F (37.3 C) (Core (Comment))  Resp 27  Ht 5\' 3"  (1.6 m)  Wt 197 lb 1.5 oz (89.4 kg)  BMI 34.92 kg/m2  SpO2 97%  HEMODYNAMICS:    VENTILATOR SETTINGS: Pt was extubated and is on 2L [place].   INTAKE / OUTPUT: I/O last 3 completed shifts: In: 4829.4 [I.V.:2599.4; Other:720; NG/GT:910; IV Piggyback:600] Out: 4100 [Urine:4100]   (+) 21 L since admission  PHYSICAL EXAMINATION: General:  Awake. Alert. Comfortable. Not in distress. Integument:  Warm & dry. No rash on exposed skin.  HEENT:  No scleral injection. Endotracheal tube in place.  Cardiovascular:  Regular rhythm. No edema. No appreciable JVD.  Pulmonary:  Dec BS bilaterally. Crackles at bases. (-) rhonchi/wheeze. (-) accesory muscle use.  Abdomen: Soft. Normal bowel sounds. Nondistended. Neurological: Moving all 4 extremities equally. Nods to questions. Appears grossly nonfocal. Ext : gr 2 edema. (-) rash. Skin was warm and dry.    LABS:  BMET  Recent Labs Lab 05/20/15 0500 05/21/15 0355 05/22/15 0525  NA 150* 148* 145  K 3.2* 3.7 3.1*  CL 112* 111 106  CO2 30 29 31   BUN 17 17 13   CREATININE 0.48 0.48 0.51  GLUCOSE 141* 138* 90   Electrolytes  Recent Labs Lab 05/20/15 0500 05/21/15 0354 05/21/15 0355 05/22/15 0525   CALCIUM 8.8*  --  8.9 9.0  MG 1.8 1.8  --  1.8  PHOS 4.3  --  4.0 3.8   CBC  Recent Labs Lab 05/20/15 0500 05/21/15 0354 05/22/15 0525  WBC 14.5* 14.3* 20.5*  HGB 13.2 12.8 14.2  HCT 40.8 39.5 43.3  PLT 253 232 366   Coag's No results for input(s): APTT, INR in the last 168 hours.  Sepsis Markers No results for input(s): LATICACIDVEN, PROCALCITON, O2SATVEN in the last 168 hours. ABG  Recent Labs Lab 05/16/15 1126 05/16/15 2030 05/17/15 1422  PHART 7.219* 7.346* 7.368  PCO2ART 71.3* 56.1* 56.4*  PO2ART 73.4* 121* 536*   Liver Enzymes  Recent Labs Lab 05/20/15 0500 05/21/15 0355 05/22/15 0525  ALBUMIN 2.8* 2.9* 3.1*    Cardiac Enzymes No results for input(s): TROPONINI, PROBNP in the last 168 hours.  Glucose  Recent Labs Lab 05/21/15 0900 05/21/15 1149 05/21/15 1621 05/21/15 1929 05/22/15 0032 05/22/15 0339  GLUCAP 119* 145* 148* 104* 88 82   STUDIES:  3/9 CXR images personally reviewed: ETT in place, no infiltrate, emphysema noted 3/8 KUB: OG tube in stomach KUB 3/12:  Mild gaseous distension of right & transverse colon. No obvious obstruction Port CXR 3/13:  ETT 3.5 cm above carina. No focal opacity or effusion appreciated. Heart normal in size EKG 3/14:  Normal sinus rhythm. QTc 467ms EKG 3/14:  QTc  560ms. Sinus tach w/ PVCs Port CXR 3/18: Endotracheal tube in good position. Enteric tube in the stomach. Right PICC line and lower superior vena cava. No focal opacity.  MICROBIOLOGY: Blood Ctx x2 3/14>>> (-) Tracheal Asp Ctx 3/14:  Candida species Urine Ctx 3/14:  Negative  Blood Ctx x2 3/11:  Coag Neg Staph 1/2 Tracheal Asp Ctx 3/11:  Oral Flora Urine Ctx 3/11:  E coli Flu PCR 3/8 nasal: neg Flu PCR 3/9 trach asp: neg  ANTIBIOTICS: Imipenem 3/14>>> Vancomycin 3/11- 3/15  Doxycycline 3/8 - 3/11  Levaquin 3/11 - 3/14  SIGNIFICANT EVENTS: 3/08  Admit with COPD exacerbation, intubated 3/10  Intermittent agitation, concern for ETOH w/d  on precedex  3/14  Extubation & runs of SVT 3/16  Reintubation-->delirium vs w/d 3/20: extubated  LINES/TUBES: Foley 3/8>> PIV  OETT 7.5 3/8 - 3/14, 3/16 > OGT 3/8 - 3/14, 3/16 > R PICC 3/16 >   ASSESSMENT / PLAN:  PULMONARY A: Acute on Chronic Hypoxemic Hypercapneic Respiratory Failure S/P Extubation 3/20 COPD Exacerbation - Improving. Baseline GOLD D, FEV1 41% pred Ongoing Tobacco Use  P:   Doing well post extubation. Cont O2. Keep o2 sats > 885-90% Taper steroids Continue bronchodilators  as ordered. Start pulmicort.  Continue nicotine patch  NEUROLOGIC A:   Acute encephalopathy - Delirium vs EtOH/Benzo withdrawal/dependence  H/O EtOH use H/O Anxiety - severe here when awake  P:  Off precedex and fentanyl.  Cont trazodone, paxil  Cont Thiamine & Folic Acid IV daily Klonopin 0.5 mg prn Nicotine patch  CARDIOVASCULAR A:  Hypertension Volume Overload QT Prolongation - Resolved. Tachycardia - SVT --better  P:  Tele monitoring Continue scheduled metoprolol Hydralazine IV prn Continue Catapres Patch Will switch meds to PO once she passes swallow evaln Needs diuresis but K needs to be corrected first. Will order lasix at 4 pm -- after K is corrected. Nurse to give lasix if K is normal.   RENAL A:   Hypernatremia - Better Volume Overload P:   Will observe. Needs diuresis.   GASTROINTESTINAL A:   Ileus - Improving.  P:   Protonix IV daily [place] evaln today -- if she passes, will switch meds to PO  HEMATOLOGIC A:   Macrocytosis - Due to EtOH Leukocytosis - Unclear etiology.  P:  Trending cell counts daily w/ CBC Heparin [place] q8hr SCDs  INFECTIOUS A:   Acute Bronchitis E coli UTI FUO - New AM 3/14, persistent > withdrawal? Related to UTI? CNS 1/2 BC  P:   On Imipenem since 3/14 -- no fever. Still with elevated WBC. Likely will d/c imipinem after todays dose.   ENDOCRINE A:   Hyperglycemia  - Improved.  P:   Accu-Checks  q4-6hr SSI per Moderate Algorithm  FAMILY  - Updates: Pt updated.   Dispo : Mentioned to pt re: STR.  She wanted to go home from here. Will discuss later on. Pt needs PT evaln. She is very deconditioned.   Keep in ICU.   Monica Becton, MD 05/22/2015, 9:10 AM [facility] Pager [phone] After 3 pm or if no answer, call [phone]

## 2015-05-22 NOTE — Progress Notes (Signed)
Patients blood pressure remains high despite PRN hydralazine and scheduled dose of metoprolol. MD ordered PRN labetalol. The patient has refused the PRN medication and any additional medications to help lower her blood pressure. The patient stated that she does not feel her blood pressure is really elevated, she feels the blood pressure cuff is "squeezing too tight" causing an elevated reading. I educated the patient on how the blood pressure cuff works,changed the blood pressure cuff (per pt request), readjusted the blood pressure cuff (per pt request), and retook the blood pressure several times. The patient continued to refuse blood pressure medication. I educated the patient on the risks of her blood pressure remaining elevated. I provided the patient with a medication information sheet for labetalol and have given her time to read over it. The patient remains very distrusting of nursing staff and about the medications she is receiving. Will reassess the patients willingness to try the medication once she has read over the information sheet. S.Corbitt Cloke, RN

## 2015-05-22 NOTE — Progress Notes (Signed)
Patients 0600 blood pressure 180/92 (122). I spoke with the patient about her elevated blood pressure and offered PRN hydralazine. She declined the blood pressure medication at this time stating, "I can get it down on my own, I don't need that." Will continue to monitor. S.Jaleal Schliep, RN

## 2015-05-23 DIAGNOSIS — J441 Chronic obstructive pulmonary disease with (acute) exacerbation: Secondary | ICD-10-CM | POA: Insufficient documentation

## 2015-05-23 LAB — RENAL FUNCTION PANEL
ANION GAP: 10 (ref 5–15)
Albumin: 2.7 g/dL — ABNORMAL LOW (ref 3.5–5.0)
BUN: 12 mg/dL (ref 6–20)
CALCIUM: 8.6 mg/dL — AB (ref 8.9–10.3)
CO2: 30 mmol/L (ref 22–32)
Chloride: 101 mmol/L (ref 101–111)
Creatinine, Ser: 0.57 mg/dL (ref 0.44–1.00)
GFR calc Af Amer: >60 mL/min (ref >60)
GFR calc non Af Amer: >60 mL/min (ref >60)
GLUCOSE: 79 mg/dL (ref 65–99)
Phosphorus: 3.5 mg/dL (ref 2.5–4.6)
Potassium: 3.1 mmol/L — ABNORMAL LOW (ref 3.5–5.1)
SODIUM: 141 mmol/L (ref 135–145)

## 2015-05-23 LAB — CBC WITH DIFFERENTIAL/PLATELET
Basophils Absolute: 0 10*3/uL (ref 0.0–0.1)
Basophils Relative: 0 %
EOS ABS: 0.1 10*3/uL (ref 0.0–0.7)
EOS PCT: 1 %
HCT: 38.2 % (ref 36.0–46.0)
Hemoglobin: 13 g/dL (ref 12.0–15.0)
LYMPHS ABS: 1.3 10*3/uL (ref 0.7–4.0)
LYMPHS PCT: 8 %
MCH: 36 pg — AB (ref 26.0–34.0)
MCHC: 34 g/dL (ref 30.0–36.0)
MCV: 105.8 fL — AB (ref 78.0–100.0)
MONO ABS: 1.4 10*3/uL — AB (ref 0.1–1.0)
MONOS PCT: 9 %
NEUTROS PCT: 82 %
Neutro Abs: 13 10*3/uL — ABNORMAL HIGH (ref 1.7–7.7)
PLATELETS: 330 10*3/uL (ref 150–400)
RBC: 3.61 MIL/uL — ABNORMAL LOW (ref 3.87–5.11)
RDW: 14.9 % (ref 11.5–15.5)
WBC: 15.8 10*3/uL — ABNORMAL HIGH (ref 4.0–10.5)

## 2015-05-23 LAB — GLUCOSE, CAPILLARY
GLUCOSE-CAPILLARY: 108 mg/dL — AB (ref 65–99)
Glucose-Capillary: 129 mg/dL — ABNORMAL HIGH (ref 65–99)
Glucose-Capillary: 80 mg/dL (ref 65–99)
Glucose-Capillary: 84 mg/dL (ref 65–99)

## 2015-05-23 LAB — MAGNESIUM: Magnesium: 1.7 mg/dL (ref 1.7–2.4)

## 2015-05-23 MED ORDER — CETYLPYRIDINIUM CHLORIDE 0.05 % MT LIQD: 7.0000 mL | Freq: Two times a day (BID) | OROMUCOSAL | Status: DC

## 2015-05-23 MED ORDER — POTASSIUM CHLORIDE 10 MEQ/50ML IV SOLN
10.0000 meq | INTRAVENOUS | Status: AC
  Administered 2015-05-23 (×2): 10 meq via INTRAVENOUS
  Filled 2015-05-23 (×2): qty 50

## 2015-05-23 MED ORDER — POTASSIUM CHLORIDE CRYS ER 20 MEQ PO TBCR
40.0000 meq | EXTENDED_RELEASE_TABLET | Freq: Two times a day (BID) | ORAL | Status: AC
  Administered 2015-05-23 (×2): 40 meq via ORAL
  Filled 2015-05-23 (×2): qty 2

## 2015-05-23 MED ORDER — FUROSEMIDE 20 MG PO TABS
20.0000 mg | ORAL_TABLET | Freq: Every day | ORAL | Status: AC
  Administered 2015-05-23: 20 mg via ORAL
  Filled 2015-05-23: qty 1

## 2015-05-23 NOTE — Progress Notes (Signed)
CSW consulted : " Patient sates Sister is POA sister is reportedly in ICU per patients mother. No advanced directive on file and patient on list to be trached at 1pm. " PN reviewed. Pt has not required a trach. PT eval / recommendations are pending. PN indicate pt would like to return home at d/c. CSW will remain available to assist with SNF placement if recommended and pt is in agreement with plan.  Werner Lean LCSW [phone]

## 2015-05-23 NOTE — Progress Notes (Signed)
PULMONARY / CRITICAL CARE MEDICINE   Name: Jenny Branch MRN: [id number] DOB: [birth year]    ADMISSION DATE:  05/09/2015 CONSULTATION DATE:  05/09/15  REFERRING MD:  Dr. Doyle Askew   CHIEF COMPLAINT:  Respiratory Distress / AECOPD   BRIEF:  60 y/o female followed by BQ in the pulmonary office with COPD and ongoing tobacco use who was intubated on 3/9 for an AE of COPD. Extubated 3/14, delirium and BIPAP 3/15.  Re-intubated 3/16. Extubated on 3/20.      SUBJECTIVE: No issues overnight. Did well on [place]. Was anxious to take BP meds last night.    VITAL SIGNS: BP 133/68 mmHg  Pulse 92  Temp(Src) 99.5 F (37.5 C) (Core (Comment))  Resp 25  Ht 5\' 3"  (1.6 m)  Wt 192 lb 3.9 oz (87.2 kg)  BMI 34.06 kg/m2  SpO2 97%  HEMODYNAMICS:    VENTILATOR SETTINGS: Pt was extubated and is on 2L [place].   INTAKE / OUTPUT: I/O last 3 completed shifts: In: 4 [Other:730; IV Piggyback:750] Out: [id number] [Urine:4825]   (+) 21 L since admission  PHYSICAL EXAMINATION: General:  Awake. Alert. Comfortable. Not in distress.Very strange affect Integument:  Warm & dry. No rash on exposed skin.  HEENT:  No scleral injection. Endotracheal tube in place.  Cardiovascular:  Regular rhythm. No edema. No appreciable JVD.  Pulmonary:  Dec BS bilaterally. Crackles at bases. (-) rhonchi/wheeze. (-) accesory muscle use.  Abdomen: Soft. Normal bowel sounds. Nondistended. Neurological: Moving all 4 extremities equally. Nods to questions. Appears grossly nonfocal. Ext : gr 2 edema. (-) rash. Skin was warm and dry.    LABS:  BMET  Recent Labs Lab 05/21/15 0355 05/22/15 0525 05/22/15 1620 05/23/15 0600  NA 148* 145  --  141  K 3.7 3.1* 3.6 3.1*  CL 111 106  --  101  CO2 29 31  --  30  BUN 17 13  --  12  CREATININE 0.48 0.51  --  0.57  GLUCOSE 138* 90  --  79   Electrolytes  Recent Labs Lab 05/21/15 0354 05/21/15 0355 05/22/15 0525 05/23/15 0600  CALCIUM  --  8.9 9.0 8.6*  MG 1.8  --  1.8 1.7  PHOS  --   4.0 3.8 3.5   CBC  Recent Labs Lab 05/21/15 0354 05/22/15 0525 05/23/15 0600  WBC 14.3* 20.5* 15.8*  HGB 12.8 14.2 13.0  HCT 39.5 43.3 38.2  PLT 232 366 330   Coag's No results for input(s): APTT, INR in the last 168 hours.  Sepsis Markers No results for input(s): LATICACIDVEN, PROCALCITON, O2SATVEN in the last 168 hours. ABG  Recent Labs Lab 05/16/15 1126 05/16/15 2030 05/17/15 1422  PHART 7.219* 7.346* 7.368  PCO2ART 71.3* 56.1* 56.4*  PO2ART 73.4* 121* 536*   Liver Enzymes  Recent Labs Lab 05/21/15 0355 05/22/15 0525 05/23/15 0600  ALBUMIN 2.9* 3.1* 2.7*    Cardiac Enzymes No results for input(s): TROPONINI, PROBNP in the last 168 hours.  Glucose  Recent Labs Lab 05/22/15 0748 05/22/15 1236 05/22/15 1808 05/22/15 2023 05/23/15 0018 05/23/15 0758  GLUCAP 82 111* 126* 100* 84 80   STUDIES:  3/9 CXR images personally reviewed: ETT in place, no infiltrate, emphysema noted 3/8 KUB: OG tube in stomach KUB 3/12:  Mild gaseous distension of right & transverse colon. No obvious obstruction Port CXR 3/13:  ETT 3.5 cm above carina. No focal opacity or effusion appreciated. Heart normal in size EKG 3/14:  Normal sinus rhythm. QTc 478ms  EKG 3/14:  QTc 549ms. Sinus tach w/ PVCs Port CXR 3/18: Endotracheal tube in good position. Enteric tube in the stomach. Right PICC line and lower superior vena cava. No focal opacity.  MICROBIOLOGY: Blood Ctx x2 3/14>>> (-) Tracheal Asp Ctx 3/14:  Candida species Urine Ctx 3/14:  Negative  Blood Ctx x2 3/11:  Coag Neg Staph 1/2 Tracheal Asp Ctx 3/11:  Oral Flora Urine Ctx 3/11:  E coli Flu PCR 3/8 nasal: neg Flu PCR 3/9 trach asp: neg  ANTIBIOTICS: Imipenem 3/14>>> Vancomycin 3/11- 3/15  Doxycycline 3/8 - 3/11  Levaquin 3/11 - 3/14  SIGNIFICANT EVENTS: 3/08  Admit with COPD exacerbation, intubated 3/10  Intermittent agitation, concern for ETOH w/d on precedex  3/14  Extubation & runs of SVT 3/16   Reintubation-->delirium vs w/d 3/20: extubated 3/22 change to sdu status  LINES/TUBES: Foley 3/8>> PIV  OETT 7.5 3/8 - 3/14, 3/16 > OGT 3/8 - 3/14, 3/16 > R PICC 3/16 >   ASSESSMENT / PLAN:  PULMONARY A: Acute on Chronic Hypoxemic Hypercapneic Respiratory Failure S/P Extubation 3/20 COPD Exacerbation - Improving. Baseline GOLD D, FEV1 41% pred Ongoing Tobacco Use  P:   Doing well post extubation. Cont O2. Keep o2 sats > 885-90% Taper steroids Continue bronchodilators  as ordered. Start pulmicort.  Continue nicotine patch  NEUROLOGIC A:   Acute encephalopathy - Delirium vs EtOH/Benzo withdrawal/dependence  H/O EtOH use H/O Anxiety - severe here when awake  P:  Off precedex and fentanyl.  Cont trazodone, paxil  Cont Thiamine & Folic Acid IV daily Klonopin 0.5 mg prn Nicotine patch  CARDIOVASCULAR A:  Hypertension Volume Overload QT Prolongation - Resolved. Tachycardia - SVT --better  P:  Tele monitoring Continue scheduled metoprolol Hydralazine IV prn Continue Catapres Patch Will switch meds to PO  Needs diuresis   RENAL  Recent Labs Lab 05/21/15 0355 05/22/15 0525 05/23/15 0600  NA 148* 145 141    Recent Labs Lab 05/22/15 0525 05/22/15 1620 05/23/15 0600  K 3.1* 3.6 3.1*      A:   Hypernatremia - Better Volume Overload P:   Will observe. Continue diuresis.   GASTROINTESTINAL A:   Ileus - Improving.  P:   Protonix IV daily   HEMATOLOGIC A:   Macrocytosis - Due to EtOH Leukocytosis - Unclear etiology.  P:  Trending cell counts daily w/ CBC Heparin [place] q8hr SCDs  INFECTIOUS A:   Acute Bronchitis E coli UTI FUO - New AM 3/14, persistent > withdrawal? Related to UTI? CNS 1/2 BC  P:   On Imipenem since 3/14 -- no fever. Still with elevated WBC. Likely will d/c imipinem after todays dose.   ENDOCRINE CBG (last 3)   Recent Labs  05/22/15 2023 05/23/15 0018 05/23/15 0758  GLUCAP 100* 84 80     A:    Hyperglycemia  - Improved.  P:   Accu-Checks q4-6hr SSI per Moderate Algorithm  FAMILY  - Updates: Pt updated.   Dispo : Mentioned to pt re: STR.  She wanted to go home from here. Will discuss later on. Pt needs PT evaln. She is very deconditioned. Remains very anxious ans paranoid.  Change to SDU status. Consider transfer to [facility].   Richardson Landry Asante Ritacco ACNP [facility] PCCM Pager [phone] till 3 pm If no answer page [phone] 05/23/2015, 9:14 AM

## 2015-05-24 DIAGNOSIS — J439 Emphysema, unspecified: Secondary | ICD-10-CM

## 2015-05-24 LAB — CBC WITH DIFFERENTIAL/PLATELET
BASOS ABS: 0 10*3/uL (ref 0.0–0.1)
Basophils Relative: 0 %
EOS ABS: 0.1 10*3/uL (ref 0.0–0.7)
EOS PCT: 1 %
HCT: 34.4 % — ABNORMAL LOW (ref 36.0–46.0)
Hemoglobin: 11.9 g/dL — ABNORMAL LOW (ref 12.0–15.0)
Lymphocytes Relative: 11 %
Lymphs Abs: 1.3 10*3/uL (ref 0.7–4.0)
MCH: 34.8 pg — ABNORMAL HIGH (ref 26.0–34.0)
MCHC: 34.6 g/dL (ref 30.0–36.0)
MCV: 100.6 fL — ABNORMAL HIGH (ref 78.0–100.0)
Monocytes Absolute: 1.2 10*3/uL — ABNORMAL HIGH (ref 0.1–1.0)
Monocytes Relative: 10 %
Neutro Abs: 9.5 10*3/uL — ABNORMAL HIGH (ref 1.7–7.7)
Neutrophils Relative %: 78 %
PLATELETS: 314 10*3/uL (ref 150–400)
RBC: 3.42 MIL/uL — AB (ref 3.87–5.11)
RDW: 14.3 % (ref 11.5–15.5)
WBC: 12.1 10*3/uL — AB (ref 4.0–10.5)

## 2015-05-24 LAB — PHOSPHORUS: Phosphorus: 3.5 mg/dL (ref 2.5–4.6)

## 2015-05-24 LAB — GLUCOSE, CAPILLARY
GLUCOSE-CAPILLARY: 105 mg/dL — AB (ref 65–99)
GLUCOSE-CAPILLARY: 129 mg/dL — AB (ref 65–99)
Glucose-Capillary: 80 mg/dL (ref 65–99)
Glucose-Capillary: 107 mg/dL — ABNORMAL HIGH (ref 65–99)

## 2015-05-24 LAB — BASIC METABOLIC PANEL
ANION GAP: 6 (ref 5–15)
BUN: 9 mg/dL (ref 6–20)
CALCIUM: 8.5 mg/dL — AB (ref 8.9–10.3)
CO2: 32 mmol/L (ref 22–32)
Chloride: 98 mmol/L — ABNORMAL LOW (ref 101–111)
Creatinine, Ser: 0.48 mg/dL (ref 0.44–1.00)
GFR calc Af Amer: >60 mL/min (ref >60)
GFR calc non Af Amer: >60 mL/min (ref >60)
Glucose, Bld: 91 mg/dL (ref 65–99)
Potassium: 3 mmol/L — ABNORMAL LOW (ref 3.5–5.1)
SODIUM: 136 mmol/L (ref 135–145)

## 2015-05-24 LAB — MAGNESIUM: MAGNESIUM: 1.5 mg/dL — AB (ref 1.7–2.4)

## 2015-05-24 MED ORDER — HYDROCHLOROTHIAZIDE 12.5 MG PO CAPS
12.5000 mg | ORAL_CAPSULE | Freq: Every day | ORAL | Status: DC
  Administered 2015-05-25: 12.5 mg via ORAL
  Filled 2015-05-24: qty 1

## 2015-05-24 MED ORDER — POTASSIUM CHLORIDE CRYS ER 20 MEQ PO TBCR
40.0000 meq | EXTENDED_RELEASE_TABLET | ORAL | Status: AC
  Administered 2015-05-24 (×2): 40 meq via ORAL
  Filled 2015-05-24 (×3): qty 2

## 2015-05-24 MED ORDER — TIOTROPIUM BROMIDE MONOHYDRATE 18 MCG IN CAPS
18.0000 ug | ORAL_CAPSULE | Freq: Every day | RESPIRATORY_TRACT | Status: DC
  Administered 2015-05-25: 18 ug via RESPIRATORY_TRACT
  Filled 2015-05-24: qty 5

## 2015-05-24 MED ORDER — MAGNESIUM SULFATE 4 GM/100ML IV SOLN
4.0000 g | Freq: Once | INTRAVENOUS | Status: AC
  Administered 2015-05-24: 4 g via INTRAVENOUS
  Filled 2015-05-24: qty 100

## 2015-05-24 MED ORDER — LOSARTAN POTASSIUM 50 MG PO TABS
50.0000 mg | ORAL_TABLET | Freq: Every day | ORAL | Status: DC
  Administered 2015-05-24 – 2015-05-25 (×2): 50 mg via ORAL
  Filled 2015-05-24 (×2): qty 1

## 2015-05-24 MED ORDER — MOMETASONE FURO-FORMOTEROL FUM 200-5 MCG/ACT IN AERO
2.0000 | INHALATION_SPRAY | Freq: Two times a day (BID) | RESPIRATORY_TRACT | Status: DC
  Administered 2015-05-24 – 2015-05-25 (×2): 2 via RESPIRATORY_TRACT
  Filled 2015-05-24: qty 8.8

## 2015-05-24 MED ORDER — ACETAMINOPHEN 325 MG PO TABS
650.0000 mg | ORAL_TABLET | Freq: Four times a day (QID) | ORAL | Status: DC | PRN
  Administered 2015-05-24 – 2015-05-25 (×3): 650 mg via ORAL
  Filled 2015-05-24 (×3): qty 2

## 2015-05-24 NOTE — Progress Notes (Signed)
Received report from ICU RN. Pt arrived unit, alert and oriented, able to communicate needs. Will continue with current plan of care.

## 2015-05-24 NOTE — Progress Notes (Addendum)
Ihor Dow visiting today spoke with bedside RN. Pt's sister, Magda Paganini, is in ICU at [facility] with Anoxic Brain Injury from post code. Danise's mom, Hoyle Sauer, has asked the aunt, Sonia Baller,  to share this information with Srihita. She spoke with this RN to determine best way to tell Iula at this time because she knows she will go "beserk" upon hearing the news. This RN has paged Marni Griffon NP to inform him of conversation to take place. Waiting answer. Chaplain paged also. Silva took conversation well without distress. Aunt and Chaplain remain at bedside.

## 2015-05-24 NOTE — Progress Notes (Signed)
PULMONARY / CRITICAL CARE MEDICINE   Name: Jenny Branch MRN: [id number] DOB: June 10, 1955    ADMISSION DATE:  05/09/2015 CONSULTATION DATE:  05/09/15  REFERRING MD:  Dr. Doyle Askew   CHIEF COMPLAINT:  Respiratory Distress / AECOPD   BRIEF:  60 y/o female followed by BQ in the pulmonary office with COPD and ongoing tobacco use who was intubated on 3/9 for an AE of COPD. Extubated 3/14, delirium and BIPAP 3/15.  Re-intubated 3/16. Extubated on 3/20.      SUBJECTIVE: No issues overnight. Did well on [place]. Was anxious to take BP meds last night.    VITAL SIGNS: BP 154/86 mmHg  Pulse 96  Temp(Src) 99 F (37.2 C) (Core (Comment))  Resp 24  Ht 5\' 3"  (1.6 m)  Wt 188 lb 11.4 oz (85.6 kg)  BMI 33.44 kg/m2  SpO2 100%  HEMODYNAMICS:    VENTILATOR SETTINGS: Pt was extubated and is on 2L [place].   INTAKE / OUTPUT:  Intake/Output Summary (Last 24 hours) at 05/24/15 1152 Last data filed at 05/24/15 0600  Gross per 24 hour  Intake    600 ml  Output   1500 ml  Net   -900 ml    PHYSICAL EXAMINATION: General:  Awake. Alert. Comfortable. Not in distress. Integument:  Warm & dry. No rash on exposed skin.  HEENT:  No scleral injection.  No JVD. PERRL  Cardiovascular:  Regular rhythm. No edema. No appreciable JVD.  Pulmonary:  Dec BS bilaterally. Crackles at bases. (-) rhonchi/wheeze. (-) accesory muscle use.  Abdomen: Soft. Normal bowel sounds. Nondistended. Neurological: Moving all 4 extremities equally. Nods to questions. Appears grossly nonfocal. Ext : gr 2 edema. (-) rash. Skin was warm and weepy .   LABS:  BMET  Recent Labs Lab 05/22/15 0525 05/22/15 1620 05/23/15 0600 05/24/15 0530  NA 145  --  141 136  K 3.1* 3.6 3.1* 3.0*  CL 106  --  101 98*  CO2 31  --  30 32  BUN 13  --  12 9  CREATININE 0.51  --  0.57 0.48  GLUCOSE 90  --  79 91   Electrolytes  Recent Labs Lab 05/22/15 0525 05/23/15 0600 05/24/15 0530  CALCIUM 9.0 8.6* 8.5*  MG 1.8 1.7 1.5*  PHOS 3.8 3.5 3.5    CBC  Recent Labs Lab 05/22/15 0525 05/23/15 0600 05/24/15 0530  WBC 20.5* 15.8* 12.1*  HGB 14.2 13.0 11.9*  HCT 43.3 38.2 34.4*  PLT 366 330 314   Coag's No results for input(s): APTT, INR in the last 168 hours.  Sepsis Markers No results for input(s): LATICACIDVEN, PROCALCITON, O2SATVEN in the last 168 hours. ABG  Recent Labs Lab 05/17/15 1422  PHART 7.368  PCO2ART 56.4*  PO2ART 536*   Liver Enzymes  Recent Labs Lab 05/21/15 0355 05/22/15 0525 05/23/15 0600  ALBUMIN 2.9* 3.1* 2.7*    Cardiac Enzymes No results for input(s): TROPONINI, PROBNP in the last 168 hours.  Glucose  Recent Labs Lab 05/23/15 0758 05/23/15 1624 05/23/15 1940 05/24/15 0001 05/24/15 0348 05/24/15 0813  GLUCAP 80 129* 108* 129* 105* 80   STUDIES:  3/9 CXR images personally reviewed: ETT in place, no infiltrate, emphysema noted 3/8 KUB: OG tube in stomach KUB 3/12:  Mild gaseous distension of right & transverse colon. No obvious obstruction Port CXR 3/13:  ETT 3.5 cm above carina. No focal opacity or effusion appreciated. Heart normal in size EKG 3/14:  Normal sinus rhythm. QTc 474ms EKG 3/14:  QTc 537ms. Sinus tach w/ PVCs Port CXR 3/18: Endotracheal tube in good position. Enteric tube in the stomach. Right PICC line and lower superior vena cava. No focal opacity.  MICROBIOLOGY: Blood Ctx x2 3/14>>> (-) Tracheal Asp Ctx 3/14:  Candida species Urine Ctx 3/14:  Negative  Blood Ctx x2 3/11:  Coag Neg Staph 1/2 Tracheal Asp Ctx 3/11:  Oral Flora Urine Ctx 3/11:  E coli Flu PCR 3/8 nasal: neg Flu PCR 3/9 trach asp: neg  ANTIBIOTICS: Imipenem 3/14>>>3/23 Vancomycin 3/11- 3/15  Doxycycline 3/8 - 3/11  Levaquin 3/11 - 3/14  SIGNIFICANT EVENTS: 3/08  Admit with COPD exacerbation, intubated 3/10  Intermittent agitation, concern for ETOH w/d on precedex  3/14  Extubation & runs of SVT 3/16  Reintubation-->delirium vs w/d 3/20: extubated 3/22 change to sdu  status  LINES/TUBES: Foley 3/8>> PIV  OETT 7.5 3/8 - 3/14, 3/16 > OGT 3/8 - 3/14, 3/16 > R PICC 3/16 >   ASSESSMENT / PLAN:  Acute on Chronic Hypoxemic Hypercapneic Respiratory Failure S/P Extubation 3/20 COPD Exacerbation - Improving. Baseline GOLD D, FEV1 41% pred Ongoing Tobacco Use Plan:   Wean FIO2 Taper steroids Resume home BDs Continue nicotine patch  E coli UTI FUO - New AM 3/14, persistent > withdrawal? Related to UTI? CNS 1/2 BC Plan:   On Imipenem since 3/14 -- no fever. Still with elevated WBC. Will dc imipenem   Acute encephalopathy - Delirium vs EtOH/Benzo withdrawal/dependence -->resolved H/O Anxiety - severe here when awake Physical deconditioning  Plan:  Cont trazodone, paxil Cont Thiamine & Folic Acid IV daily Klonopin 0.5 mg prn PT consult  Nicotine patch  Hypertension Volume Overload QT Prolongation - Resolved. Tachycardia - SVT --better Plan:  Dc tele Resume home hyzaar Continue Catapres Patch Will switch meds to PO    Macrocytosis - Due to EtOH Leukocytosis - Unclear etiology (improved). Plan Heparin [place] q8hr   FAMILY  - Updates: Pt updated.   Ready to go to med/surg Will look for SNF for rehab PT ordered  Erick Colace ACNP-BC [place] Pager # [phone] OR # [phone] if no answer   05/24/2015, 11:43 AM

## 2015-05-24 NOTE — NC FL2 (Signed)
[place] LEVEL OF CARE SCREENING TOOL     IDENTIFICATION  Patient Name: Jenny Branch Birthdate: 06-20-1955 Sex: female Admission Date (Current Location): 05/09/2015  [facility] and [place] Number:  Herbalist and Address:  [facility],  [place] [address], [place]      Provider Number: [id number]  Attending Physician Name and Address:  Javier Glazier, MD  Relative Name and Phone Number:       Current Level of Care: Hospital Recommended Level of Care: [place] Prior Approval Number:    Date Approved/Denied:   PASRR Number:    Discharge Plan: SNF    Current Diagnoses: Patient Active Problem List   Diagnosis Date Noted  . COPD exacerbation ([place])   . AKI (acute kidney injury) ([place]) 05/11/2015  . Encephalopathy acute 05/11/2015  . Acute respiratory failure ([place])   . Acute respiratory failure with hypercapnia ([place]) 05/09/2015  . COPD with acute exacerbation ([place]) 05/09/2015  . Hyponatremia 05/09/2015  . Hypokalemia 05/09/2015  . Leukocytosis 05/09/2015  . History of alcohol use 05/09/2015  . Essential hypertension 06/29/2014  . COPD GOLD III  07/28/2013  . Cigarette smoker 07/28/2013    Orientation RESPIRATION BLADDER Height & Weight     Self, Time, Situation, Place  O2 Indwelling catheter Weight: 85.6 kg (188 lb 11.4 oz) Height:  5\' 3"  (160 cm)  BEHAVIORAL SYMPTOMS/MOOD NEUROLOGICAL BOWEL NUTRITION STATUS  Other (Comment) (no behaviors)   Continent Diet  AMBULATORY STATUS COMMUNICATION OF NEEDS Skin   Extensive Assist Verbally Normal                       Personal Care Assistance Level of Assistance  Bathing, Feeding, Dressing Bathing Assistance: Limited assistance Feeding assistance: Independent Dressing Assistance: Limited assistance     Functional Limitations Info  Sight, Hearing, Speech Sight Info: Adequate Hearing Info: Adequate Speech Info: Adequate    SPECIAL CARE FACTORS  FREQUENCY  PT (By licensed PT), OT (By licensed OT)     PT Frequency: 5 x wk OT Frequency: 5 x wk            Contractures Contractures Info: Not present    Additional Factors Info  Code Status Code Status Info: Full Code             Current Medications (05/24/2015):  This is the current hospital active medication list Current Facility-Administered Medications  Medication Dose Route Frequency Provider Last Rate Last Dose  . albuterol (PROVENTIL) (2.5 MG/3ML) 0.083% nebulizer solution 2.5 mg  2.5 mg Nebulization Q2H PRN Theodis Blaze, MD      . antiseptic oral rinse (CPC / CETYLPYRIDINIUM CHLORIDE 0.05%) solution 7 mL  7 mL Mouth Rinse BID Javier Glazier, MD   7 mL at 05/23/15 2200  . bisacodyl (DULCOLAX) suppository 10 mg  10 mg Rectal Daily PRN Donita Brooks, NP   10 mg at 05/18/15 1729  . clonazePAM (KLONOPIN) disintegrating tablet 0.5 mg  0.5 mg Per Tube 3 times per day Javier Glazier, MD   0.5 mg at 05/24/15 0534  . cloNIDine (CATAPRES - Dosed in mg/24 hr) patch 0.1 mg  0.1 mg Transdermal Weekly Javier Glazier, MD   0.1 mg at 05/21/15 0942  . folic acid injection 1 mg  1 mg Intravenous Daily Anders Simmonds, MD   1 mg at 05/24/15 0943  . heparin injection 5,000 Units  5,000 Units Subcutaneous 3 times  per day Donita Brooks, NP   5,000 Units at 05/24/15 0534  . hydrochlorothiazide (MICROZIDE) capsule 12.5 mg  12.5 mg Oral Daily Erick Colace, NP      . losartan (COZAAR) tablet 50 mg  50 mg Oral Daily Erick Colace, NP      . magnesium sulfate IVPB 4 g 100 mL  4 g Intravenous Once Erick Colace, NP      . mometasone-formoterol (DULERA) 200-5 MCG/ACT inhaler 2 puff  2 puff Inhalation BID Erick Colace, NP      . PARoxetine (PAXIL) tablet 20 mg  20 mg Oral Daily Erick Colace, NP   20 mg at 05/24/15 0935  . potassium chloride SA (K-DUR,KLOR-CON) CR tablet 40 mEq  40 mEq Oral Q4H Erick Colace, NP      . predniSONE (DELTASONE) tablet 30 mg  30 mg Oral Q  breakfast Javier Glazier, MD   30 mg at 05/24/15 0935  . sennosides (SENOKOT) 8.8 MG/5ML syrup 5 mL  5 mL Oral BID Javier Glazier, MD   5 mL at 05/20/15 2158  . sodium chloride flush (NS) 0.9 % injection 10-40 mL  10-40 mL Intracatheter Q12H Javier Glazier, MD   10 mL at 05/24/15 1000  . sodium chloride flush (NS) 0.9 % injection 10-40 mL  10-40 mL Intracatheter PRN Javier Glazier, MD   10 mL at 05/20/15 0945  . thiamine (B-1) injection 100 mg  100 mg Intravenous Daily Anders Simmonds, MD   100 mg at 05/24/15 0935  . tiotropium (SPIRIVA) inhalation capsule 18 mcg  18 mcg Inhalation Daily Erick Colace, NP      . traZODone (DESYREL) tablet 50 mg  50 mg Oral QHS PRN Javier Glazier, MD         Discharge Medications: Please see discharge summary for a list of discharge medications.  Relevant Imaging Results:  Relevant Lab Results:   Additional Information SS # [id number]  Verdis Bassette, Randall An, LCSW

## 2015-05-24 NOTE — Progress Notes (Signed)
[facility] Progress Note Patient Name: Jenny Branch DOB: October 02, 1955 MRN: [id number]   Date of Service  05/24/2015  HPI/Events of Note  Notified by bedside nurse of headache.  eICU Interventions  Tylenol by mouth when necessary     Intervention Category Intermediate Interventions: Pain - evaluation and management  Tera Partridge 05/24/2015, 3:54 PM

## 2015-05-24 NOTE — Clinical Social Work Placement (Signed)
   CLINICAL SOCIAL WORK PLACEMENT  NOTE  Date:  05/24/2015  Patient Details  Name: Jenny Branch MRN: [id number] Date of Birth: 1956-02-27  Clinical Social Work is seeking post-discharge placement for this patient at the [place] level of care (*CSW will initial, date and re-position this form in  chart as items are completed):  Yes   Patient/family provided with [place] Work Department's list of facilities offering this level of care within the geographic area requested by the patient (or if unable, by the patient's family).  Yes   Patient/family informed of their freedom to choose among providers that offer the needed level of care, that participate in Medicare, Medicaid or managed care program needed by the patient, have an available bed and are willing to accept the patient.  Yes   Patient/family informed of [place]'s ownership interest in [facility] and [facility], as well as of the fact that they are under no obligation to receive care at these facilities.  PASRR submitted to EDS on 05/24/15     PASRR number received on       Existing PASRR number confirmed on       FL2 transmitted to all facilities in geographic area requested by pt/family on 05/24/15     FL2 transmitted to all facilities within larger geographic area on       Patient informed that his/her managed care company has contracts with or will negotiate with certain facilities, including the following:            Patient/family informed of bed offers received.  Patient chooses bed at       Physician recommends and patient chooses bed at      Patient to be transferred to   on  .  Patient to be transferred to facility by       Patient family notified on   of transfer.  Name of family member notified:        PHYSICIAN       Additional Comment:    _______________________________________________ Luretha Rued, [place] 05/24/2015, 2:34  PM

## 2015-05-24 NOTE — Progress Notes (Signed)
Critical Care interim Discharge Summary       Patient ID: Jenny Branch MRN: [id number] DOB/AGE: May 23, 1955 60 y.o.  Admit date: 05/09/2015 Discharge date: 05/24/2015  Discharge Diagnoses:  Acute on Chronic Hypoxemic Hypercapneic Respiratory Failure S/P Extubation 3/20 COPD Exacerbation - Improving. Baseline GOLD D, FEV1 41% pred Ongoing Tobacco E coli UTI FUO  Acute encephalopathy -  Delirium vs EtOH/Benzo withdrawal dependence H/O Anxiety  Physical deconditioning  Hypertension Volume Overload QT Prolongation - Resolved. Tachycardia - SVT --better Macrocytosis - Due to EtOH Leukocytosis - Unclear etiology (improved).   Detailed Hospital Course:    60 y/o female followed by BQ in the pulmonary office with COPD and ongoing tobacco use who was intubated on 3/9 for an AE of COPD & UTI. Treated in usual fashion w/ IV abx, hydration, scheduled BDs and systemic steroids. Extubated 3/14, delirium and w/d was worse. and BIPAP 3/15. Re-intubated 3/16 for what was most likely poly-substance w/d (ETOH/benzos) superimposed on underlying severe airflow obstructions. She was supported on precedex infusion. We did widen abx at that time w/ Imipenem given concern about fever and possible nosocomial infection. He was pan-cultured at that time and based all cultures are now negative. She completed antibiotics on 3/23. Since this time her precedex is now off. We are weaning prednisone and have converted her to her home BD regimen. We d/c'd tele monitoring on 3/23 and we transferred her to the med/surg ward. At this point her primary focus will be on rehab efforts w/ plan to eventually go to a SNF.    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 Baseline GOLD D, FEV1 41% pred Ongoing Tobacco Use Plan:  Taper steroids Resume home BDs Wean FIO2 Continue nicotine patch Will f/u our office  4/27 at 3pm   H/O Anxiety Resolved ETOH w/d & acute encephalopathy  Physical deconditioning  Plan:  Cont trazodone, paxil Cont Thiamine & Folic Acid IV daily Klonopin 0.5 mg prn PT consult -->needs SNF  Nicotine patch  Hypertension Plan:  Resume home hyzaar Continue Catapres Patch  Macrocytosis - Due to EtOH Leukocytosis - Unclear etiology (improved). Plan Heparin  q8hr Intermittent CBC   Significant Hospital tests/ studies  Consults: none  STUDIES:  3/9 CXR images personally reviewed: ETT in place, no infiltrate, emphysema noted 3/8 KUB: OG tube in stomach KUB 3/12: Mild gaseous distension of right & transverse colon. No obvious obstruction Port CXR 3/13: ETT 3.5 cm above carina. No focal opacity or effusion appreciated. Heart normal in size EKG 3/14: Normal sinus rhythm. QTc 476ms EKG 3/14: QTc 549ms. Sinus tach w/ PVCs Port CXR 3/18: Endotracheal tube in good position. Enteric tube in the stomach. Right PICC line and lower superior vena cava. No focal opacity.  MICROBIOLOGY: Blood Ctx x2 3/14>>> (-) Tracheal Asp Ctx 3/14: Candida species Urine Ctx 3/14: Negative  Blood Ctx x2 3/11: Coag Neg Staph 1/2 Tracheal Asp Ctx 3/11: Oral [place] Urine Ctx 3/11: E coli Flu PCR 3/8 nasal: neg Flu PCR 3/9 trach asp: neg  ANTIBIOTICS: Imipenem 3/14>>>3/23 Vancomycin 3/11- 3/15  Doxycycline 3/8 - 3/11  Levaquin 3/11 - 3/14  SIGNIFICANT EVENTS: 3/08 Admit with COPD exacerbation, intubated 3/10 Intermittent agitation, concern for ETOH w/d on precedex  3/14 Extubation & runs of SVT 3/16 Reintubation-->delirium vs w/d 3/20: extubated 3/22 change to sdu status  LINES/TUBES: Foley 3/8>>3/20 PIV  OETT 7.5 3/8 - 3/14, 3/16 >3/20 OGT 3/8 -  3/14, 3/16 >3/20 R PICC 3/16 >    Discharge Exam: BP 135/83 mmHg  Pulse 93  Temp(Src) 97.7 F (36.5 C) (Oral)  Resp 20  Ht 5\' 3"  (1.6 m)  Wt 188 lb 11.4 oz (85.6 kg)  BMI 33.44 kg/m2  SpO2 98%  2 liters    General: Awake. Alert. Comfortable. Not in distress. Integument: Warm & dry. No rash on exposed skin.  HEENT: No scleral injection. No JVD. PERRL  Cardiovascular: Regular rhythm. No edema. No appreciable JVD.  Pulmonary: Dec BS bilaterally. Crackles at bases. (-) rhonchi/wheeze. (-) accesory muscle use.  Abdomen: Soft. Normal bowel sounds. Nondistended. Neurological: Moving all 4 extremities equally. Nods to questions. Appears grossly nonfocal. Ext : gr 2 edema. (-) rash. Skin was warm and weepy .   Labs at discharge Lab Results  Component Value Date   CREATININE 0.48 05/24/2015   BUN 9 05/24/2015   NA 136 05/24/2015   K 3.0* 05/24/2015   CL 98* 05/24/2015   CO2 32 05/24/2015   Lab Results  Component Value Date   WBC 12.1* 05/24/2015   HGB 11.9* 05/24/2015   HCT 34.4* 05/24/2015   MCV 100.6* 05/24/2015   PLT 314 05/24/2015   Lab Results  Component Value Date   ALT 37* 08/02/2010   AST 32 08/02/2010   ALKPHOS 83 08/02/2010   BILITOT 0.3 08/02/2010   No results found for: INR, PROTIME  Current radiology studies No results found.  Scheduled Meds: . clonazepam  0.5 mg Per Tube 3 times per day  . cloNIDine  0.1 mg Transdermal Weekly  . heparin subcutaneous  5,000 Units Subcutaneous 3 times per day  . hydrochlorothiazide  12.5 mg Oral Daily  . losartan  50 mg Oral Daily  . mometasone-formoterol  2 puff Inhalation BID  . PARoxetine  20 mg Oral Daily  . potassium chloride  40 mEq Oral Q4H  . predniSONE  30 mg Oral Q breakfast  . sennosides  5 mL Oral BID  . sodium chloride flush  10-40 mL Intracatheter Q12H  . tiotropium  18 mcg Inhalation Daily   Continuous Infusions:  PRN Meds:.acetaminophen, albuterol, bisacodyl, traZODone   Disposition: Will transfer to [facility] & medsurg while awaiting SNF.   Erick Colace ACNP-BC [place] Pager # [phone] OR # [phone] if no answer

## 2015-05-24 NOTE — Clinical Social Work Note (Signed)
Clinical Social Work Assessment  Patient Details  Name: BARBARA AHART MRN: [id number] Date of Birth: [birth year]  Date of referral:  05/24/15               Reason for consult:  Facility Placement, Discharge Planning                Permission sought to share information with:  Facility Art therapist granted to share information::  Yes, Verbal Permission Granted  Name::        Agency::     Relationship::     Contact Information:     Housing/Transportation Living arrangements for the past 2 months:  Single Family Home Source of Information:  Patient Patient Interpreter Needed:  None Criminal Activity/Legal Involvement Pertinent to Current Situation/Hospitalization:  No - Comment as needed Significant Relationships:  Parents Lives with:  Self Do you feel safe going back to the place where you live?  No (SNF needed.) Need for family participation in patient care:  Yes (Comment)  Care giving concerns:  Pt's care cannot be managed at home following hospital d/c.   Social Worker assessment / plan: Pt hospitalized on 05/09/15 with acute respiratory distress. PT has recommended SNF placement at d/c. CSW has  Met with pt to review recommendations. Pt is in agreement with [facility] plan and would like to go to [facility] ( [facility] ) for her rehab. SNF search has been initiated and bed offers are pending. [facility] as been contacted and a decision is pending. CSW will continue to follow to assist with d/c planning to SNF.  Employment status:  Disabled (Comment on whether or not currently receiving Disability) Insurance information:  Medicare PT Recommendations:  [place] / Referral to community resources:  [place]  Patient/Family's Response to care:  Pt feels rehab is needed.  Patient/Family's Understanding of and Emotional Response to Diagnosis, Current Treatment, and Prognosis:  Pt is aware of her medical status. She is looking forward to  progressing and is hopeful [facility] will have an opening for er at d/c.  Emotional Assessment Appearance:  Appears stated age Attitude/Demeanor/Rapport:  Other (cooperative) Affect (typically observed):  Calm, Pleasant, Appropriate Orientation:  Oriented to Self, Oriented to Place, Oriented to  Time, Oriented to Situation Alcohol / Substance use:  Not Applicable Psych involvement (Current and /or in the community):  No (Comment)  Discharge Needs  Concerns to be addressed:  Discharge Planning Concerns Readmission within the last 30 days:  No Current discharge risk:  None Barriers to Discharge:  No Barriers Identified   Luretha Rued, [place] 05/24/2015, 2:25 PM

## 2015-05-25 DIAGNOSIS — R000 Tachycardia, unspecified: Secondary | ICD-10-CM | POA: Diagnosis not present

## 2015-05-25 DIAGNOSIS — K567 Ileus, unspecified: Secondary | ICD-10-CM | POA: Diagnosis not present

## 2015-05-25 DIAGNOSIS — M6281 Muscle weakness (generalized): Secondary | ICD-10-CM | POA: Diagnosis not present

## 2015-05-25 DIAGNOSIS — R2689 Other abnormalities of gait and mobility: Secondary | ICD-10-CM | POA: Diagnosis not present

## 2015-05-25 DIAGNOSIS — R2681 Unsteadiness on feet: Secondary | ICD-10-CM | POA: Diagnosis not present

## 2015-05-25 DIAGNOSIS — J449 Chronic obstructive pulmonary disease, unspecified: Secondary | ICD-10-CM | POA: Insufficient documentation

## 2015-05-25 DIAGNOSIS — G9340 Encephalopathy, unspecified: Secondary | ICD-10-CM | POA: Diagnosis not present

## 2015-05-25 DIAGNOSIS — J441 Chronic obstructive pulmonary disease with (acute) exacerbation: Secondary | ICD-10-CM | POA: Diagnosis not present

## 2015-05-25 DIAGNOSIS — J9602 Acute respiratory failure with hypercapnia: Secondary | ICD-10-CM | POA: Diagnosis not present

## 2015-05-25 DIAGNOSIS — I10 Essential (primary) hypertension: Secondary | ICD-10-CM | POA: Diagnosis not present

## 2015-05-25 DIAGNOSIS — N179 Acute kidney failure, unspecified: Secondary | ICD-10-CM | POA: Diagnosis not present

## 2015-05-25 DIAGNOSIS — J9600 Acute respiratory failure, unspecified whether with hypoxia or hypercapnia: Secondary | ICD-10-CM | POA: Diagnosis not present

## 2015-05-25 DIAGNOSIS — E871 Hypo-osmolality and hyponatremia: Secondary | ICD-10-CM | POA: Diagnosis not present

## 2015-05-25 LAB — RENAL FUNCTION PANEL
Albumin: 2.9 g/dL — ABNORMAL LOW (ref 3.5–5.0)
Anion gap: 6 (ref 5–15)
BUN: 9 mg/dL (ref 6–20)
CALCIUM: 8.5 mg/dL — AB (ref 8.9–10.3)
CHLORIDE: 102 mmol/L (ref 101–111)
CO2: 31 mmol/L (ref 22–32)
CREATININE: 0.56 mg/dL (ref 0.44–1.00)
GFR calc Af Amer: >60 mL/min (ref >60)
GFR calc non Af Amer: >60 mL/min (ref >60)
GLUCOSE: 90 mg/dL (ref 65–99)
Phosphorus: 3.8 mg/dL (ref 2.5–4.6)
Potassium: 3.8 mmol/L (ref 3.5–5.1)
SODIUM: 139 mmol/L (ref 135–145)

## 2015-05-25 LAB — MAGNESIUM: Magnesium: 1.9 mg/dL (ref 1.7–2.4)

## 2015-05-25 MED ORDER — HEPARIN SODIUM (PORCINE) 5000 UNIT/ML IJ SOLN: 5000.0000 [IU] | Freq: Three times a day (TID) | INTRAMUSCULAR | Status: DC

## 2015-05-25 MED ORDER — SENNOSIDES 8.8 MG/5ML PO SYRP: 5.0000 mL | ORAL_SOLUTION | Freq: Two times a day (BID) | ORAL | Status: AC

## 2015-05-25 MED ORDER — PREDNISONE 10 MG PO TABS: ORAL_TABLET | ORAL | Status: DC

## 2015-05-25 MED ORDER — TRAMADOL HCL 50 MG PO TABS
50.0000 mg | ORAL_TABLET | Freq: Once | ORAL | Status: AC
  Administered 2015-05-25: 50 mg via ORAL
  Filled 2015-05-25: qty 1

## 2015-05-25 MED ORDER — CLONIDINE HCL 0.1 MG/24HR TD PTWK: 0.1000 mg | MEDICATED_PATCH | TRANSDERMAL | Status: DC

## 2015-05-25 MED ORDER — CLONAZEPAM 0.5 MG PO TABS: 0.2500 mg | ORAL_TABLET | Freq: Three times a day (TID) | ORAL | Status: DC | PRN

## 2015-05-25 NOTE — Care Management Important Message (Signed)
Important Message  Patient Details  Name: Jenny Branch MRN: [id number] Date of Birth: 1955-12-12   Medicare Important Message Given:  Yes    Camillo Flaming 05/25/2015, 11:58 AMImportant Message  Patient Details  Name: Jenny Branch MRN: [id number] Date of Birth: [birth year]   Medicare Important Message Given:  Yes    Camillo Flaming 05/25/2015, 11:58 AM

## 2015-05-25 NOTE — Clinical Social Work Placement (Signed)
   CLINICAL SOCIAL WORK PLACEMENT  NOTE  Date:  05/25/2015  Patient Details  Name: Jenny Branch MRN: [id number] Date of Birth: [birth year]  Clinical Social Work is seeking post-discharge placement for this patient at the [place] level of care (*CSW will initial, date and re-position this form in  chart as items are completed):  Yes   Patient/family provided with [place] Work Department's list of facilities offering this level of care within the geographic area requested by the patient (or if unable, by the patient's family).  Yes   Patient/family informed of their freedom to choose among providers that offer the needed level of care, that participate in Medicare, Medicaid or managed care program needed by the patient, have an available bed and are willing to accept the patient.  Yes   Patient/family informed of [place]'s ownership interest in [facility] and [facility], as well as of the fact that they are under no obligation to receive care at these facilities.  PASRR submitted to EDS on 05/24/15     PASRR number received on       Existing PASRR number confirmed on       FL2 transmitted to all facilities in geographic area requested by pt/family on 05/24/15     FL2 transmitted to all facilities within larger geographic area on       Patient informed that his/her managed care company has contracts with or will negotiate with certain facilities, including the following:        Yes   Patient/family informed of bed offers received.  Patient chooses bed at [place], [place]     Physician recommends and patient chooses bed at      Patient to be transferred to [facility] on 05/25/15.  Patient to be transferred to facility by [facility]     Patient family notified on 05/25/15 of transfer.  Name of family member notified:  MOTHER     PHYSICIAN       Additional Comment: Pt / mother are in agreement with d/c to  [facility] today. [facility] transport required. Medical necessity form completed. Pt is aware out of pocket costs may be associated with [facility] transport. D/C Summary sent to SNF for review. Scripts included in d/c packet. # for report provide to nsg.   _______________________________________________ Luretha Rued, [place] 05/25/2015, 3:08 PM

## 2015-05-25 NOTE — Discharge Summary (Signed)
Physician Discharge Summary       Patient ID: Patient ID: Jenny Branch MRN: [id number] DOB/AGE: [birth year] 58 y.o.  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 Baseline GOLD D, FEV1 41% pred Ongoing Tobacco E coli UTI FUO  Acute encephalopathy -  Delirium vs EtOH/Benzo withdrawal dependence H/O Anxiety  Physical deconditioning  Hypertension Volume Overload QT Prolongation - Resolved. Tachycardia - SVT --better Macrocytosis - Due to EtOH Leukocytosis - Unclear etiology (improved).   Detailed Hospital Course:   60 y/o female followed by BQ in the pulmonary office with COPD and ongoing tobacco use who was intubated on 3/9 for an AE of COPD & UTI. Treated in usual fashion w/ IV abx, hydration, scheduled BDs and systemic steroids. Extubated 3/14, delirium and w/d was worse. and BIPAP 3/15. Re-intubated 3/16 for what was most likely poly-substance w/d (ETOH/benzos) superimposed on underlying severe airflow obstructions. She was supported on precedex infusion. We did widen abx at that time w/ Imipenem given concern about fever and possible nosocomial infection. He was pan-cultured at that time and based all cultures are now negative. She completed antibiotics on 3/23. Since this time her precedex is now off. We are weaning prednisone and have converted her to her home BD regimen. We d/c'd tele monitoring on 3/23 and we transferred her to the med/surg ward. At this point her primary focus will be on rehab efforts w/ plan to eventually go to a SNF.   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 Baseline GOLD D, FEV1 41% pred Ongoing Tobacco Use Plan:  Taper steroids Resume home BDs Wean FIO2 Continue nicotine patch Will f/u  our office 4/27 at 3pm   H/O Anxiety Resolved ETOH w/d & acute encephalopathy  Physical deconditioning  Plan:  Cont trazodone, paxil Cont Thiamine & Folic Acid IV daily Klonopin 0.5 mg prn PT consult -->needs SNF  Nicotine patch  Hypertension Plan:  Resume home hyzaar Continue Catapres Patch  Macrocytosis - Due to EtOH Leukocytosis - Unclear etiology (improved). Plan Heparin [place] q8hr Intermittent CBC   Significant Hospital tests/ studies  Consults: none  STUDIES:  3/9 CXR images personally reviewed: ETT in place, no infiltrate, emphysema noted 3/8 KUB: OG tube in stomach KUB 3/12: Mild gaseous distension of right & transverse colon. No obvious obstruction Port CXR 3/13: ETT 3.5 cm above carina. No focal opacity or effusion appreciated. Heart normal in size EKG 3/14: Normal sinus rhythm. QTc 434ms EKG 3/14: QTc 575ms. Sinus tach w/ PVCs Port CXR 3/18: Endotracheal tube in good position. Enteric tube in the stomach. Right PICC line and lower superior vena cava. No focal opacity.  MICROBIOLOGY: Blood Ctx x2 3/14>>> (-) Tracheal Asp Ctx 3/14: Candida species Urine Ctx 3/14: Negative  Blood Ctx x2 3/11: Coag Neg Staph 1/2 Tracheal Asp Ctx 3/11: Oral Flora Urine Ctx 3/11: E coli Flu PCR 3/8 nasal: neg Flu PCR 3/9 trach asp: neg  ANTIBIOTICS: Imipenem 3/14>>>3/23 Vancomycin 3/11- 3/15  Doxycycline 3/8 - 3/11  Levaquin 3/11 - 3/14  SIGNIFICANT EVENTS: 3/08 Admit with COPD exacerbation, intubated 3/10 Intermittent agitation, concern for ETOH w/d on precedex  3/14 Extubation & runs of SVT 3/16 Reintubation-->delirium vs w/d 3/20: extubated 3/22 change to sdu status  LINES/TUBES: Foley 3/8>>3/20 PIV  OETT 7.5 3/8 - 3/14, 3/16 >3/20 OGT 3/8 - 3/14, 3/16 >  3/20 R PICC 3/16 > 3/24   Discharge Exam: BP 131/78 mmHg  Pulse 86  Temp(Src) 98.1 F (36.7 C) (Oral)  Resp 18  Ht 5\' 3"  (1.6 m)  Wt 188 lb 11.4 oz (85.6 kg)  BMI 33.44 kg/m2  SpO2  100%  Room air  General: Awake. Alert. Comfortable. Not in distress. Up in chair.  Integument: Warm & dry. No rash on exposed skin.  HEENT: No scleral injection. No JVD. PERRL  Cardiovascular: Regular rhythm. No edema. No appreciable JVD.  Pulmonary: Dec BS bilaterally. Crackles at bases. (-) rhonchi/wheeze. (-) accesory muscle use.  Abdomen: Soft. Normal bowel sounds. Nondistended. Neurological: Moving all 4 extremities equally. Nods to questions. Appears grossly nonfocal. Ext : gr 2 edema. (-) rash. Skin was warm and weepy .    Labs at discharge Lab Results  Component Value Date   CREATININE 0.56 05/25/2015   BUN 9 05/25/2015   NA 139 05/25/2015   K 3.8 05/25/2015   CL 102 05/25/2015   CO2 31 05/25/2015   Lab Results  Component Value Date   WBC 12.1* 05/24/2015   HGB 11.9* 05/24/2015   HCT 34.4* 05/24/2015   MCV 100.6* 05/24/2015   PLT 314 05/24/2015   Lab Results  Component Value Date   ALT 37* 08/02/2010   AST 32 08/02/2010   ALKPHOS 83 08/02/2010   BILITOT 0.3 08/02/2010   No results found for: INR, PROTIME  Current radiology studies No results found.  Disposition:  06-Home-Health Care Svc      Discharge Instructions    Diet - low sodium heart healthy    Complete by:  As directed      Increase activity slowly    Complete by:  As directed             Medication List    STOP taking these medications        doxycycline 100 MG capsule  Commonly known as:  VIBRAMYCIN     nystatin 100000 UNIT/ML suspension  Commonly known as:  MYCOSTATIN      TAKE these medications        albuterol (2.5 MG/3ML) 0.083% nebulizer solution  Commonly known as:  PROVENTIL  Take 3 mLs (2.5 mg total) by nebulization every 4 (four) hours as needed.     albuterol 108 (90 Base) MCG/ACT inhaler  Commonly known as:  PROVENTIL HFA;VENTOLIN HFA  Inhale 2 puffs into the lungs every 6 (six) hours as needed for wheezing.     budesonide-formoterol 160-4.5 MCG/ACT  inhaler  Commonly known as:  SYMBICORT  Take 2 puffs first thing in am and then another 2 puffs about 12 hours later.     clonazePAM 0.5 MG tablet  Commonly known as:  KLONOPIN  Take 0.5 tablets (0.25 mg total) by mouth 3 (three) times daily as needed for anxiety.     cloNIDine 0.1 mg/24hr patch  Commonly known as:  CATAPRES - Dosed in mg/24 hr  Place 1 patch (0.1 mg total) onto the skin once a week.     dextromethorphan-guaiFENesin 30-600 MG 12hr tablet  Commonly known as:  MUCINEX DM  Take 1-2 tablets by mouth every 12 (twelve) hours. With water For congestion and cough     fish oil-omega-3 fatty acids 1000 MG capsule  Take 1 g by mouth daily.     heparin 5000 UNIT/ML injection  Inject 1 mL (5,000 Units total) into the skin every 8 (eight) hours.     losartan-hydrochlorothiazide 50-12.5 MG  tablet  Commonly known as:  HYZAAR  Take 1 tablet by mouth daily.     mometasone 50 MCG/ACT nasal spray  Commonly known as:  NASONEX  Place 2 sprays into the nose daily.     PARoxetine 20 MG tablet  Commonly known as:  PAXIL  Take 20 mg by mouth daily.     predniSONE 10 MG tablet  Commonly known as:  DELTASONE  Take 3 tabs daily for 3 days, then 2 tabs daily for 3 days then 1 tab daily for 3 days then stay at 1/2 tab daily.     sennosides 8.8 MG/5ML syrup  Commonly known as:  SENOKOT  Take 5 mLs by mouth 2 (two) times daily.     tiotropium 18 MCG inhalation capsule  Commonly known as:  SPIRIVA  Place 1 capsule (18 mcg total) into inhaler and inhale daily.     traZODone 50 MG tablet  Commonly known as:  DESYREL  Take 50 mg by mouth at bedtime as needed for sleep.         Discharged Condition: good  Physician Statement:   The Patient was personally examined, the discharge assessment and plan has been personally reviewed and I agree with ACNP Neko Mcgeehan's assessment and plan. > 30 minutes of time have been dedicated to discharge assessment, planning and discharge instructions.     Signed: Clementeen Graham 05/25/2015, 11:12 AM

## 2015-05-25 NOTE — Evaluation (Signed)
Physical Therapy Evaluation Patient Details Name: Jenny Branch MRN: [id number] DOB: 01-01-1956 Today's Date: 05/25/2015   History of Present Illness  60 y/o female with COPD and ongoing tobacco use who was intubated on 3/9 for an AE of COPD & UTI, Extubated 3/14, delirium and w/d was worse. and BIPAP 3/15. Re-intubated 3/16 due to delirium vs w/d and extubated 3/20  Clinical Impression  Pt admitted with above diagnosis. Pt currently with functional limitations due to the deficits listed below (see PT Problem List).  Pt will benefit from skilled PT to increase their independence and safety with mobility to allow discharge to the venue listed below.  Pt presents with generalized weakness and deconditioning.  Pt declined mobilizing with PT at this time due to difficulty getting up to recliner with nursing earlier.  Discussed safe transfers with staff using stedy so pt also assisting to her ability.  Pt reports her plan is to d/c to SNF for rehab.  Pt did discuss her sister and tearful at times however knows she also needs to participate to get stronger and more independent therefore started LE exercises today.     Follow Up Recommendations SNF;Supervision/Assistance - 24 hour    Equipment Recommendations  None recommended by PT    Recommendations for Other Services       Precautions / Restrictions Precautions Precautions: Fall Precaution Comments: monitor sats      Mobility  Bed Mobility                  Transfers                 General transfer comment: pt up in recliner on arrival, states required +2 to get her to recliner and "was not pretty", she did not wish to attempt further mobility at this time, recommended stedy to use for return to bed (both pt and RN aware)  Ambulation/Gait                Stairs            Wheelchair Mobility    Modified Curiale (Stroke Patients Only)       Balance                                              Pertinent Vitals/Pain Pain Assessment: 0-10 Pain Score:  (did not rate) Pain Location: UE and LEs from swelling Pain Descriptors / Indicators: Sore;Tender Pain Intervention(s): Monitored during session;Limited activity within patient's tolerance;Repositioned    Home Living Family/patient expects to be discharged to:: Skilled nursing facility Living Arrangements: Alone                    Prior Function Level of Independence: Independent               Hand Dominance        Extremity/Trunk Assessment               Lower Extremity Assessment: Generalized weakness (swelling of LEs R > L)         Communication   Communication: No difficulties  Cognition Arousal/Alertness: Awake/alert Behavior During Therapy: WFL for tasks assessed/performed Overall Cognitive Status: Within Functional Limits for tasks assessed                      General Comments  Exercises General Exercises - Lower Extremity Ankle Circles/Pumps: AROM;Both;10 reps Quad Sets: AROM;Both;10 reps Heel Slides: AAROM;Both;10 reps Hip ABduction/ADduction: AAROM;Both;10 reps      Assessment/Plan    PT Assessment Patient needs continued PT services  PT Diagnosis Difficulty walking;Generalized weakness   PT Problem List Decreased strength;Pain;Decreased activity tolerance;Decreased mobility;Decreased balance;Decreased knowledge of use of DME;Cardiopulmonary status limiting activity  PT Treatment Interventions DME instruction;Gait training;Functional mobility training;Patient/family education;Therapeutic activities;Wheelchair mobility training;Therapeutic exercise;Balance training   PT Goals (Current goals can be found in the Care Plan section) Acute Rehab PT Goals PT Goal Formulation: With patient Time For Goal Achievement: 06/01/15 Potential to Achieve Goals: Good    Frequency Min 3X/week   Barriers to discharge        Co-evaluation                End of Session Equipment Utilized During Treatment: Oxygen Activity Tolerance: Patient limited by fatigue Patient left: in chair;with call bell/phone within reach;with chair alarm set Nurse Communication: Need for lift equipment         Time: [id number] PT Time Calculation (min) (ACUTE ONLY): 16 min   Charges:   PT Evaluation $PT Eval Low Complexity: 1 Procedure     PT G Codes:        Olufemi Mofield,KATHrine E 05/25/2015, 1:02 PM Carmelia Bake, PT, DPT 05/25/2015 Pager: [phone]

## 2015-05-25 NOTE — Progress Notes (Signed)
Gave report to Bonnielee Haff, RN at [facility]. Left number if she had additional questions.

## 2015-06-01 ENCOUNTER — Other Ambulatory Visit: Payer: Self-pay

## 2015-06-01 MED ORDER — ALBUTEROL SULFATE HFA 108 (90 BASE) MCG/ACT IN AERS: 2.0000 | INHALATION_SPRAY | Freq: Four times a day (QID) | RESPIRATORY_TRACT | Status: DC | PRN

## 2015-06-01 MED ORDER — AEROCHAMBER MV MISC: Status: AC

## 2015-06-19 DIAGNOSIS — J449 Chronic obstructive pulmonary disease, unspecified: Secondary | ICD-10-CM | POA: Diagnosis not present

## 2015-06-19 DIAGNOSIS — I10 Essential (primary) hypertension: Secondary | ICD-10-CM | POA: Diagnosis not present

## 2015-06-19 DIAGNOSIS — Z720 Tobacco use: Secondary | ICD-10-CM | POA: Diagnosis not present

## 2015-06-19 DIAGNOSIS — G9340 Encephalopathy, unspecified: Secondary | ICD-10-CM | POA: Diagnosis not present

## 2015-06-19 DIAGNOSIS — Z7952 Long term (current) use of systemic steroids: Secondary | ICD-10-CM | POA: Diagnosis not present

## 2015-06-19 DIAGNOSIS — F419 Anxiety disorder, unspecified: Secondary | ICD-10-CM | POA: Diagnosis not present

## 2015-06-19 DIAGNOSIS — R2689 Other abnormalities of gait and mobility: Secondary | ICD-10-CM | POA: Diagnosis not present

## 2015-06-21 DIAGNOSIS — I10 Essential (primary) hypertension: Secondary | ICD-10-CM | POA: Diagnosis not present

## 2015-06-21 DIAGNOSIS — J449 Chronic obstructive pulmonary disease, unspecified: Secondary | ICD-10-CM | POA: Diagnosis not present

## 2015-06-21 DIAGNOSIS — Z7952 Long term (current) use of systemic steroids: Secondary | ICD-10-CM | POA: Diagnosis not present

## 2015-06-21 DIAGNOSIS — G9340 Encephalopathy, unspecified: Secondary | ICD-10-CM | POA: Diagnosis not present

## 2015-06-21 DIAGNOSIS — F419 Anxiety disorder, unspecified: Secondary | ICD-10-CM | POA: Diagnosis not present

## 2015-06-21 DIAGNOSIS — R2689 Other abnormalities of gait and mobility: Secondary | ICD-10-CM | POA: Diagnosis not present

## 2015-06-26 DIAGNOSIS — N390 Urinary tract infection, site not specified: Secondary | ICD-10-CM | POA: Diagnosis not present

## 2015-06-26 DIAGNOSIS — R42 Dizziness and giddiness: Secondary | ICD-10-CM | POA: Diagnosis not present

## 2015-06-26 DIAGNOSIS — R8290 Unspecified abnormal findings in urine: Secondary | ICD-10-CM | POA: Diagnosis not present

## 2015-06-26 DIAGNOSIS — J42 Unspecified chronic bronchitis: Secondary | ICD-10-CM | POA: Diagnosis not present

## 2015-06-26 DIAGNOSIS — Z09 Encounter for follow-up examination after completed treatment for conditions other than malignant neoplasm: Secondary | ICD-10-CM | POA: Diagnosis not present

## 2015-06-27 DIAGNOSIS — G9340 Encephalopathy, unspecified: Secondary | ICD-10-CM | POA: Diagnosis not present

## 2015-06-27 DIAGNOSIS — J449 Chronic obstructive pulmonary disease, unspecified: Secondary | ICD-10-CM | POA: Diagnosis not present

## 2015-06-27 DIAGNOSIS — Z7952 Long term (current) use of systemic steroids: Secondary | ICD-10-CM | POA: Diagnosis not present

## 2015-06-27 DIAGNOSIS — F419 Anxiety disorder, unspecified: Secondary | ICD-10-CM | POA: Diagnosis not present

## 2015-06-27 DIAGNOSIS — R2689 Other abnormalities of gait and mobility: Secondary | ICD-10-CM | POA: Diagnosis not present

## 2015-06-27 DIAGNOSIS — I10 Essential (primary) hypertension: Secondary | ICD-10-CM | POA: Diagnosis not present

## 2015-06-28 ENCOUNTER — Ambulatory Visit: Payer: Medicare Other | Admitting: Pulmonary Disease

## 2015-06-29 DIAGNOSIS — R2689 Other abnormalities of gait and mobility: Secondary | ICD-10-CM | POA: Diagnosis not present

## 2015-06-29 DIAGNOSIS — G9340 Encephalopathy, unspecified: Secondary | ICD-10-CM | POA: Diagnosis not present

## 2015-06-29 DIAGNOSIS — Z7952 Long term (current) use of systemic steroids: Secondary | ICD-10-CM | POA: Diagnosis not present

## 2015-06-29 DIAGNOSIS — F419 Anxiety disorder, unspecified: Secondary | ICD-10-CM | POA: Diagnosis not present

## 2015-06-29 DIAGNOSIS — J449 Chronic obstructive pulmonary disease, unspecified: Secondary | ICD-10-CM | POA: Diagnosis not present

## 2015-06-29 DIAGNOSIS — I10 Essential (primary) hypertension: Secondary | ICD-10-CM | POA: Diagnosis not present

## 2015-07-10 DIAGNOSIS — F419 Anxiety disorder, unspecified: Secondary | ICD-10-CM | POA: Diagnosis not present

## 2015-07-10 DIAGNOSIS — Z7952 Long term (current) use of systemic steroids: Secondary | ICD-10-CM | POA: Diagnosis not present

## 2015-07-10 DIAGNOSIS — I10 Essential (primary) hypertension: Secondary | ICD-10-CM | POA: Diagnosis not present

## 2015-07-10 DIAGNOSIS — R2689 Other abnormalities of gait and mobility: Secondary | ICD-10-CM | POA: Diagnosis not present

## 2015-07-10 DIAGNOSIS — G9340 Encephalopathy, unspecified: Secondary | ICD-10-CM | POA: Diagnosis not present

## 2015-07-10 DIAGNOSIS — J449 Chronic obstructive pulmonary disease, unspecified: Secondary | ICD-10-CM | POA: Diagnosis not present

## 2015-07-12 DIAGNOSIS — R2689 Other abnormalities of gait and mobility: Secondary | ICD-10-CM | POA: Diagnosis not present

## 2015-07-12 DIAGNOSIS — G9340 Encephalopathy, unspecified: Secondary | ICD-10-CM | POA: Diagnosis not present

## 2015-07-12 DIAGNOSIS — F419 Anxiety disorder, unspecified: Secondary | ICD-10-CM | POA: Diagnosis not present

## 2015-07-12 DIAGNOSIS — I10 Essential (primary) hypertension: Secondary | ICD-10-CM | POA: Diagnosis not present

## 2015-07-12 DIAGNOSIS — J449 Chronic obstructive pulmonary disease, unspecified: Secondary | ICD-10-CM | POA: Diagnosis not present

## 2015-07-12 DIAGNOSIS — Z7952 Long term (current) use of systemic steroids: Secondary | ICD-10-CM | POA: Diagnosis not present

## 2015-07-19 DIAGNOSIS — G9340 Encephalopathy, unspecified: Secondary | ICD-10-CM | POA: Diagnosis not present

## 2015-07-19 DIAGNOSIS — I10 Essential (primary) hypertension: Secondary | ICD-10-CM | POA: Diagnosis not present

## 2015-07-19 DIAGNOSIS — F419 Anxiety disorder, unspecified: Secondary | ICD-10-CM | POA: Diagnosis not present

## 2015-07-19 DIAGNOSIS — R2689 Other abnormalities of gait and mobility: Secondary | ICD-10-CM | POA: Diagnosis not present

## 2015-07-19 DIAGNOSIS — J449 Chronic obstructive pulmonary disease, unspecified: Secondary | ICD-10-CM | POA: Diagnosis not present

## 2015-07-19 DIAGNOSIS — Z7952 Long term (current) use of systemic steroids: Secondary | ICD-10-CM | POA: Diagnosis not present

## 2015-07-26 DIAGNOSIS — J0120 Acute ethmoidal sinusitis, unspecified: Secondary | ICD-10-CM | POA: Diagnosis not present

## 2015-08-01 ENCOUNTER — Ambulatory Visit (INDEPENDENT_AMBULATORY_CARE_PROVIDER_SITE_OTHER): Payer: Medicare Other | Admitting: Pulmonary Disease

## 2015-08-01 ENCOUNTER — Encounter: Payer: Self-pay | Admitting: Pulmonary Disease

## 2015-08-01 DIAGNOSIS — J449 Chronic obstructive pulmonary disease, unspecified: Secondary | ICD-10-CM | POA: Diagnosis not present

## 2015-08-01 NOTE — Assessment & Plan Note (Signed)
Jenny Branch had a horrible exacerbation of COPD back in March requiring mechanical ventilatory support. She has recovered and actually looks remarkably well today. She's been compliant with Symbicort and Spiriva. Unfortunately, she started smoking again. I advised her today that this clearly is still curious to her health and will likely lead to another one of these episodes. However, I tried to encourage her that because she was able to quit before she should be able to quit again.  She is still deconditioned and needs to exercise more.  Plan: Pulmonary rehabilitation referral Continue Symbicort and Spiriva Continue as needed albuterol Prescribe new nebulizer machine Follow-up in 3 months or sooner if needed

## 2015-08-01 NOTE — Progress Notes (Signed)
Subjective:    Patient ID: Jenny Branch, female    DOB: 1955/12/21, 60 y.o.   MRN: [id number]   Synopsis: GOLD Grade D, 07/29/2011 simple spirometry> ratio 54%, FEV1 1.03 L (41% predicted), current smoker as of 07/2015 despite a hospitalization in 05/2015 with respiratory failure on a ventilator.    HPI Chief Complaint  Patient presents with  . Follow-up    Pt states she's doing well-on last day of abx for a sinus infection.  CAT score 33.   Jenny Branch has really been through a lot lately.  Her sister died when Jenny Branch was hospitalized. She feels like she is doing pretty well. She had a bad sinus infection last week and she is completing antibiotics today. She feels that she doesn't quite have the leg strength that she used to have, she completes physical therapy. She would like to get a new nebulizer machine because her current machine doesn't work well and is too heavy for her to bring when she travels. She uses her albuterol nebulizer when she is at home, not too often. She may use her albuterol inhaler once per day, but not too often.   She is still taking the symbicort and spiriva. She is having more allergic rhinitis symptoms which is giving her some hoarse. Her breathing is OK right now, still some exertional dyspnea. She is still coughing up mucus.  She is smoking about 10 cigarettes per day right now.  She really doesn't want to smoke,   Past Medical History  Diagnosis Date  . COPD (chronic obstructive pulmonary disease) ([place])   . Asthma   . Hypertension   . Depression   . PTSD (post-traumatic stress disorder)      Review of Systems  Constitutional: Negative for fever, chills and fatigue.  HENT: Negative for postnasal drip, rhinorrhea and sinus pressure.   Respiratory: Positive for cough. Negative for shortness of breath and wheezing.   Cardiovascular: Negative for chest pain, palpitations and leg swelling.       Objective:   Physical Exam Filed Vitals:   08/01/15 1532   BP: 126/74  Pulse: 103  Height: 5\' 3"  (1.6 m)  Weight: 166 lb (75.297 kg)  SpO2: 97%   RA  Gen: well appearing, no acute distress HEENT: NCAT,  EOMi, OP clear, PULM: Few wheezes right base, air movement normal today CV: RRR, no mgr, no JVD AB: BS+, soft, nontender,  Ext: warm, no edema, no clubbing, no cyanosis Derm: no rash or skin breakdown    March 2017 discharge summary reviewed were she was treated for a COPD exacerbation requiring mechanical ventilation.  Assessment & Plan:   Cigarette smoker She was counseled to quit smoking today.    COPD GOLD III  Jenny Branch had a horrible exacerbation of COPD back in March requiring mechanical ventilatory support. She has recovered and actually looks remarkably well today. She's been compliant with Symbicort and Spiriva. Unfortunately, she started smoking again. I advised her today that this clearly is still curious to her health and will likely lead to another one of these episodes. However, I tried to encourage her that because she was able to quit before she should be able to quit again.  She is still deconditioned and needs to exercise more.  Plan: Pulmonary rehabilitation referral Continue Symbicort and Spiriva Continue as needed albuterol Prescribe new nebulizer machine Follow-up in 3 months or sooner if needed    Updated Medication List Outpatient Encounter Prescriptions as of 08/01/2015  Medication Sig  .  albuterol (PROVENTIL HFA;VENTOLIN HFA) 108 (90 Base) MCG/ACT inhaler Inhale 2 puffs into the lungs every 6 (six) hours as needed for wheezing.  Marland Kitchen albuterol (PROVENTIL) (2.5 MG/3ML) 0.083% nebulizer solution Take 3 mLs (2.5 mg total) by nebulization every 4 (four) hours as needed.  . budesonide-formoterol (SYMBICORT) 160-4.5 MCG/ACT inhaler Take 2 puffs first thing in am and then another 2 puffs about 12 hours later.  . clonazePAM (KLONOPIN) 0.5 MG tablet Take 0.25 mg by mouth 3 (three) times daily as needed for anxiety.  Marland Kitchen  dextromethorphan-guaiFENesin (MUCINEX DM) 30-600 MG per 12 hr tablet Take 1-2 tablets by mouth every 12 (twelve) hours. With water For congestion and cough   . fish oil-omega-3 fatty acids 1000 MG capsule Take 1 g by mouth daily.    Marland Kitchen losartan-hydrochlorothiazide (HYZAAR) 50-12.5 MG per tablet Take 1 tablet by mouth daily.  . mometasone (NASONEX) 50 MCG/ACT nasal spray Place 2 sprays into the nose daily.  Marland Kitchen PARoxetine (PAXIL) 20 MG tablet Take 20 mg by mouth daily.  . predniSONE (DELTASONE) 10 MG tablet Take 3 tabs daily for 3 days, then 2 tabs daily for 3 days then 1 tab daily for 3 days then stay at 1/2 tab daily.  . sennosides (SENOKOT) 8.8 MG/5ML syrup Take 5 mLs by mouth 2 (two) times daily.  Marland Kitchen Spacer/Aero-Holding Chambers (AEROCHAMBER MV) inhaler Use as instructed  . tiotropium (SPIRIVA) 18 MCG inhalation capsule Place 1 capsule (18 mcg total) into inhaler and inhale daily.  . traZODone (DESYREL) 50 MG tablet Take 50 mg by mouth at bedtime as needed for sleep.  . [DISCONTINUED] clonazePAM (KLONOPIN) 0.5 MG tablet Take 0.5 tablets (0.25 mg total) by mouth 3 (three) times daily as needed for anxiety. (Patient not taking: Reported on 08/01/2015)  . [DISCONTINUED] cloNIDine (CATAPRES - DOSED IN MG/24 HR) 0.1 mg/24hr patch Place 1 patch (0.1 mg total) onto the skin once a week. (Patient not taking: Reported on 08/01/2015)  . [DISCONTINUED] heparin 5000 UNIT/ML injection Inject 1 mL (5,000 Units total) into the skin every 8 (eight) hours. (Patient not taking: Reported on 08/01/2015)   No facility-administered encounter medications on file as of 08/01/2015.

## 2015-08-01 NOTE — Patient Instructions (Signed)
Stop smoking We will make a referral to pulmonary rehabilitation Continue Symbicort and Spiriva We will prescribe a new nebulizer machine Stay active We will see you back in 3 months or sooner if needed

## 2015-08-01 NOTE — Assessment & Plan Note (Signed)
She was counseled to quit smoking today.

## 2015-08-02 ENCOUNTER — Other Ambulatory Visit: Payer: Self-pay | Admitting: *Deleted

## 2015-08-02 ENCOUNTER — Telehealth: Payer: Self-pay | Admitting: Pulmonary Disease

## 2015-08-02 DIAGNOSIS — J432 Centrilobular emphysema: Secondary | ICD-10-CM

## 2015-08-02 NOTE — Telephone Encounter (Signed)
Received a call from [facility]. States that the order for pulmonary rehab was placed with a dx code of COPD. Pt does not qualify for pulmonary rehab with that dx code. Another code will have to picked.  BQ - please advise. Thanks.

## 2015-08-02 NOTE — Telephone Encounter (Signed)
New order has been placed with new dx code. Jenny Branch is aware. Nothing further was needed.

## 2015-08-02 NOTE — Telephone Encounter (Signed)
Centrilobular emphysema

## 2015-08-03 DIAGNOSIS — I10 Essential (primary) hypertension: Secondary | ICD-10-CM | POA: Diagnosis not present

## 2015-08-03 DIAGNOSIS — G9340 Encephalopathy, unspecified: Secondary | ICD-10-CM | POA: Diagnosis not present

## 2015-08-03 DIAGNOSIS — R2689 Other abnormalities of gait and mobility: Secondary | ICD-10-CM | POA: Diagnosis not present

## 2015-08-03 DIAGNOSIS — F419 Anxiety disorder, unspecified: Secondary | ICD-10-CM | POA: Diagnosis not present

## 2015-08-03 DIAGNOSIS — Z7952 Long term (current) use of systemic steroids: Secondary | ICD-10-CM | POA: Diagnosis not present

## 2015-08-03 DIAGNOSIS — J449 Chronic obstructive pulmonary disease, unspecified: Secondary | ICD-10-CM | POA: Diagnosis not present

## 2015-08-08 ENCOUNTER — Other Ambulatory Visit: Payer: Self-pay | Admitting: Pulmonary Disease

## 2015-08-20 ENCOUNTER — Telehealth: Payer: Self-pay | Admitting: Pulmonary Disease

## 2015-08-20 DIAGNOSIS — J439 Emphysema, unspecified: Secondary | ICD-10-CM

## 2015-08-20 NOTE — Telephone Encounter (Signed)
Spoke with Jenny Branch at Pulmonary Rehab. Made her aware that we corrected this order on 08/02/15. After further review of the chart, this order had been canceled. Another referral has been made with emphysema as the dx. Nothing further was needed.

## 2015-08-28 ENCOUNTER — Ambulatory Visit (INDEPENDENT_AMBULATORY_CARE_PROVIDER_SITE_OTHER): Payer: Medicare Other | Admitting: Pulmonary Disease

## 2015-08-28 ENCOUNTER — Ambulatory Visit (INDEPENDENT_AMBULATORY_CARE_PROVIDER_SITE_OTHER)
Admission: RE | Admit: 2015-08-28 | Discharge: 2015-08-28 | Disposition: A | Discharge status: Home / Self Care | Payer: Medicare Other | Source: Ambulatory Visit | Attending: Pulmonary Disease | Admitting: Pulmonary Disease

## 2015-08-28 ENCOUNTER — Encounter: Payer: Self-pay | Admitting: Pulmonary Disease

## 2015-08-28 VITALS — BP 154/72 | HR 92 | Temp 98.0°F | Ht 63.5 in | Wt 179.2 lb

## 2015-08-28 DIAGNOSIS — J441 Chronic obstructive pulmonary disease with (acute) exacerbation: Secondary | ICD-10-CM | POA: Diagnosis not present

## 2015-08-28 DIAGNOSIS — R05 Cough: Secondary | ICD-10-CM | POA: Diagnosis not present

## 2015-08-28 MED ORDER — AZITHROMYCIN 500 MG PO TABS
500.0000 mg | ORAL_TABLET | Freq: Every day | ORAL | Status: DC
Start: 2015-08-28 — End: 2015-11-01

## 2015-08-28 MED ORDER — PREDNISONE 10 MG PO TABS: ORAL_TABLET | ORAL | Status: DC

## 2015-08-28 MED ORDER — FLUTTER DEVI: Status: AC

## 2015-08-28 MED ORDER — CLOTRIMAZOLE 10 MG MT TROC: 10.0000 mg | Freq: Every day | OROMUCOSAL | Status: DC

## 2015-08-28 NOTE — Progress Notes (Signed)
Patient seen in the office today and instructed on use of Flutter Valve.  Patient expressed understanding and demonstrated technique. Parke Poisson, [facility] 08/28/2015

## 2015-08-28 NOTE — Addendum Note (Signed)
Addended by: Parke Poisson E on: 08/28/2015 02:38 PM   Modules accepted: Orders

## 2015-08-28 NOTE — Addendum Note (Signed)
Addended by: Parke Poisson E on: 08/28/2015 02:20 PM   Modules accepted: Orders

## 2015-08-28 NOTE — Addendum Note (Signed)
Addended by: Parke Poisson E on: 08/28/2015 05:27 PM   Modules accepted: Orders

## 2015-08-28 NOTE — Progress Notes (Signed)
Subjective:    Patient ID: Jenny Branch, female    DOB: 10-23-1955, 60 y.o.   MRN: [id number]  HPI  Acute visit for resp distress  Jenny Branch is a 60 year old with GOLD Grade D. she's had hospitalization in March with severe exacerbation requiring intubation. Unfortunately she continues to smoke. She reports increasing shortness of breath, cough, wheezing, sputum production for the past 1 week. Her started after she got some trees cut down at home and she was exposed to dust. She has increased her prednisone to 35 mg with no relief in symptoms. She also reports hoarseness of voice, left ear pain. Denies any fevers, chills.  DATA: PFTs 07/28/13 FVC 1.91 (62%) FEV1 1.03 (41%) F/F 74  Past Medical History  Diagnosis Date  . COPD (chronic obstructive pulmonary disease) ([place])   . Asthma   . Hypertension   . Depression   . PTSD (post-traumatic stress disorder)     Current outpatient prescriptions:  .  albuterol (PROVENTIL HFA;VENTOLIN HFA) 108 (90 Base) MCG/ACT inhaler, Inhale 2 puffs into the lungs every 6 (six) hours as needed for wheezing., Disp: 3 Inhaler, Rfl: 3 .  albuterol (PROVENTIL) (2.5 MG/3ML) 0.083% nebulizer solution, Take 3 mLs (2.5 mg total) by nebulization every 4 (four) hours as needed., Disp: 120 mL, Rfl: 2 .  budesonide-formoterol (SYMBICORT) 160-4.5 MCG/ACT inhaler, Take 2 puffs first thing in am and then another 2 puffs about 12 hours later., Disp: 1 Inhaler, Rfl: 3 .  clonazePAM (KLONOPIN) 0.5 MG tablet, Take 0.25 mg by mouth 3 (three) times daily as needed for anxiety., Disp: , Rfl:  .  dextromethorphan-guaiFENesin (MUCINEX DM) 30-600 MG per 12 hr tablet, Take 1-2 tablets by mouth every 12 (twelve) hours. With water For congestion and cough , Disp: , Rfl:  .  fish oil-omega-3 fatty acids 1000 MG capsule, Take 1 g by mouth daily.  , Disp: , Rfl:  .  losartan-hydrochlorothiazide (HYZAAR) 50-12.5 MG per tablet, Take 1 tablet by mouth daily., Disp: 30 tablet, Rfl:  5 .  mometasone (NASONEX) 50 MCG/ACT nasal spray, Place 2 sprays into the nose daily., Disp: 17 g, Rfl: 11 .  PARoxetine (PAXIL) 20 MG tablet, Take 20 mg by mouth daily., Disp: , Rfl:  .  predniSONE (DELTASONE) 5 MG tablet, TAKE 1 TABLET BY MOUTH DAILY WITH BREAKFAST, Disp: 90 tablet, Rfl: 1 .  sennosides (SENOKOT) 8.8 MG/5ML syrup, Take 5 mLs by mouth 2 (two) times daily., Disp: 240 mL, Rfl: 0 .  Spacer/Aero-Holding Chambers (AEROCHAMBER MV) inhaler, Use as instructed, Disp: 1 each, Rfl: 0 .  tiotropium (SPIRIVA) 18 MCG inhalation capsule, Place 1 capsule (18 mcg total) into inhaler and inhale daily., Disp: 30 capsule, Rfl: 6 .  traZODone (DESYREL) 50 MG tablet, Take 50 mg by mouth at bedtime as needed for sleep., Disp: , Rfl:    Review of Systems Cough, sputum production, dyspnea, wheezing, hoarseness of voice. Ear pain. No fevers, chills, hemoptysis. No chest pain, palpation. No nausea, vomiting, diarrhea, compensation. Other review of systems are negative    Objective:   Physical Exam Blood pressure 154/72, pulse 92, temperature 98 F (36.7 C), temperature source Oral, height 5' 3.5" (1.613 m), weight 179 lb 3.2 oz (81.285 kg), SpO2 97 %. Gen: Mild distress Neuro: No gross focal deficits. HEENT: No JVD, lymphadenopathy, thyromegaly. Lt ear with errythematous bulging tympanic membranes RS: Diminished breath sounds. B/L crackles. No wheeze CVS: S1-S2 heard, no murmurs rubs gallops. Abdomen: Soft, positive bowel sounds. Musculoskeletal: No  edema.    Assessment & Plan:  Severe COPD with exacerbation Acute Otitis media Hoarse voice with oral thrush. Active smoker  Plan: -CXR -Azithromycin 500 mg daily for 7 days -Clotriamzole troches oral 10 mg dissolved slowly 5 times daily for 14 consecutive days  -Increase prednisone to 60 mg daily. Reduce dose by 10 mg every 2 days till she reached baseline dose -Flutter valve, mucinex and delsym for treatment of cough. -Smoking  cessation  Marshell Garfinkel MD [facility] Pager [phone] If no answer or after 3pm call: [phone] 08/28/2015, 2:09 PM

## 2015-08-28 NOTE — Patient Instructions (Signed)
We will prescribe the following  CXR Azithromycin 500 mg daily for 7 days Clotriamzole troches 10 mg dissolved slowly 5 times daily for 14 days  Increase prednisone to 60 mg daily. Reduce dose by 10 mg every 2 days till she reached baseline dose Flutter valve, mucinex and delsym for treatment of cough.  Please call if there is no improvement in symptoms.

## 2015-08-29 NOTE — Progress Notes (Signed)
Quick Note:  Patient returned call. Advised of lab results / recs as stated by PM. Pt verbalized understanding and denied any questions. ______

## 2015-08-29 NOTE — Progress Notes (Signed)
Quick Note:  LMOM TCB x1. ______ 

## 2015-09-12 ENCOUNTER — Other Ambulatory Visit: Payer: Self-pay | Admitting: Pulmonary Disease

## 2015-09-12 NOTE — Telephone Encounter (Signed)
Received faxed refill request for zpak BQ pt seen acutely by PM Last ov 6.27.17 w/ PM where zpak was prescribed zpak denied - if pt is still sick she needs to contact the office Will sign off

## 2015-09-26 ENCOUNTER — Other Ambulatory Visit: Payer: Self-pay | Admitting: Pulmonary Disease

## 2015-11-01 ENCOUNTER — Encounter: Payer: Self-pay | Admitting: Pulmonary Disease

## 2015-11-01 ENCOUNTER — Ambulatory Visit (INDEPENDENT_AMBULATORY_CARE_PROVIDER_SITE_OTHER)
Admission: RE | Admit: 2015-11-01 | Discharge: 2015-11-01 | Disposition: A | Discharge status: Home / Self Care | Payer: Medicare Other | Source: Ambulatory Visit | Attending: Pulmonary Disease | Admitting: Pulmonary Disease

## 2015-11-01 ENCOUNTER — Ambulatory Visit (INDEPENDENT_AMBULATORY_CARE_PROVIDER_SITE_OTHER): Payer: Medicare Other | Admitting: Pulmonary Disease

## 2015-11-01 DIAGNOSIS — J441 Chronic obstructive pulmonary disease with (acute) exacerbation: Secondary | ICD-10-CM

## 2015-11-01 DIAGNOSIS — Z720 Tobacco use: Secondary | ICD-10-CM

## 2015-11-01 DIAGNOSIS — F17210 Nicotine dependence, cigarettes, uncomplicated: Secondary | ICD-10-CM

## 2015-11-01 MED ORDER — LEVOFLOXACIN 500 MG PO TABS: 500.0000 mg | ORAL_TABLET | Freq: Every day | ORAL | 0 refills | Status: DC

## 2015-11-01 MED ORDER — PREDNISONE 10 MG PO TABS: ORAL_TABLET | ORAL | 0 refills | Status: DC

## 2015-11-01 NOTE — Assessment & Plan Note (Signed)
Continues to smoke 25 cigarettes daily despite worsening dyspnea Plan: Counseled extensively about smoking cessation, and choosing between breathing and smoking. Offered Quit Smart Smoking Cessation classes which she declined. Follow up in 3-4 weeks with NP or Dr. Lake Bells. Please contact office for sooner follow up if symptoms do not improve or worsen or seek emergency care

## 2015-11-01 NOTE — Progress Notes (Signed)
Attending:  I have seen and examined the patient with nurse practitioner/resident and agree with the note above.  We formulated the plan together and I elicited the following history.    Josselyne has been feeling worse Coughing up mucus Fever at home  Smoking again   On exam: Lungs tight, some wheezing CV: RRR, no mgr   AE COPD: Levaquin given high likelihood of pseudomonas, prednisone taper, continue inhaled therapy as ordered Flu shot next visit CXR today  Tobacco abuse: Unfortunately she started back again: I personally also counseled her on the importance of tobacco cessation again today. Ideally, she would benefit from using a daily antibiotic to prevent exacerbations of COPD but with ongoing tobacco use that more of a waste of time.  Fatigue: High risk for sleep apnea, polysomnogram after next visit  Roselie Awkward, MD [facility] Pager: [phone] Cell: [phone] After 3pm or if no response, call [phone]

## 2015-11-01 NOTE — Assessment & Plan Note (Signed)
COPD Exacerbation Continued Tobacco Abuse Plan: Smoking cessation is the single most powerful action that you can take to improve your pulmonary function. Please work hard to quit smoking. We will walk you in the office today We will check a CXR today. We will call you with the results. Levaquin  500 mg daily x 5  Days Prednisone taper; 10 mg tablets: 4 tabs x 3 days, 3 tabs x 3 days, 2 tabs x 3 days 1 tab x 3 days then resume your 5 mg daily dose.. Fliutter valve, Mucinex  For cough and mobilization of secretions. Continue taking your Claritin 10 mg once daily Delsym cough syrup at night for sleep. Flu shot at follow up appointment. Consider sleep study at follow up. Follow up appointment with Dr. Lake Bells or NP in 3-4 weeks Please contact office for sooner follow up if symptoms do not improve or worsen or seek emergency care

## 2015-11-01 NOTE — Progress Notes (Signed)
History of Present Illness Jenny Branch is a 60 y.o. female current smoker with Gold Stage D COPD followed by Dr. Lake Bells.   11/01/2015 Acute OV: Pt. Presents today for worsening of shortness of breath, wheezing, cough, and increased sputum production, fatigue, and shakiness. She had quit smoking when she was in  the hospital in [place] during rehab, but began smoking again ar the end of April. She is currently smoking 20-25 cigarettes daily. She is currently taking prednisone 5 mg daily, but took 15 mg this morning for worsening shortness of breath.She is coughing up white secretions, at times some scant brown.She thinks she has had a fever.She has a sore throat and an ear ache. She is compliant with Symbicort, Spiriva, prednisone and Ventolin puffer. She is using her neb treatments 1-2 times daily.She states she has been progressively more short of breath over the last 2 months.We spoke at length about smoking cessation, but she does not want to go back to the classes. She knows she needs to quit.She states that her weight is stable. Pt. Refused to walk today in the office.  DATA: PFTs 07/28/13 FVC 1.91 (62%) FEV1 1.03 (41%) F/F 74  Past medical hx Past Medical History:  Diagnosis Date  . Asthma   . COPD (chronic obstructive pulmonary disease) ([place])   . Depression   . Hypertension   . PTSD (post-traumatic stress disorder)      Past surgical hx, Family hx, Social hx all reviewed.  Current Outpatient Prescriptions on File Prior to Visit  Medication Sig  . albuterol (PROVENTIL HFA;VENTOLIN HFA) 108 (90 Base) MCG/ACT inhaler Inhale 2 puffs into the lungs every 6 (six) hours as needed for wheezing.  Marland Kitchen albuterol (PROVENTIL) (2.5 MG/3ML) 0.083% nebulizer solution Take 3 mLs (2.5 mg total) by nebulization every 4 (four) hours as needed.  . budesonide-formoterol (SYMBICORT) 160-4.5 MCG/ACT inhaler Take 2 puffs first thing in am and then another 2 puffs about 12 hours later.  . clonazePAM  (KLONOPIN) 0.5 MG tablet Take 0.25 mg by mouth 3 (three) times daily as needed for anxiety.  Marland Kitchen dextromethorphan-guaiFENesin (MUCINEX DM) 30-600 MG per 12 hr tablet Take 1-2 tablets by mouth every 12 (twelve) hours. With water For congestion and cough   . fish oil-omega-3 fatty acids 1000 MG capsule Take 1 g by mouth daily.    Marland Kitchen losartan-hydrochlorothiazide (HYZAAR) 50-12.5 MG per tablet Take 1 tablet by mouth daily.  . mometasone (NASONEX) 50 MCG/ACT nasal spray Place 2 sprays into the nose daily.  Marland Kitchen PARoxetine (PAXIL) 20 MG tablet Take 20 mg by mouth daily.  . predniSONE (DELTASONE) 5 MG tablet TAKE ONE (1) TABLET BY MOUTH EVERY DAY WITH BREAKFAST  . Respiratory Therapy Supplies (FLUTTER) DEVI Use as directed.  . sennosides (SENOKOT) 8.8 MG/5ML syrup Take 5 mLs by mouth 2 (two) times daily.  Marland Kitchen Spacer/Aero-Holding Chambers (AEROCHAMBER MV) inhaler Use as instructed  . tiotropium (SPIRIVA) 18 MCG inhalation capsule Place 1 capsule (18 mcg total) into inhaler and inhale daily.  . traZODone (DESYREL) 50 MG tablet Take 50 mg by mouth at bedtime as needed for sleep.   No current facility-administered medications on file prior to visit.      Allergies  Allergen Reactions  . Penicillins Itching and Rash    Review Of Systems:  Constitutional:   No  weight loss, night sweats,  Fevers, chills, fatigue, or  lassitude.  HEENT:   No headaches,  Difficulty swallowing,  Tooth/dental problems, or  Sore throat,  No sneezing, itching, ear ache, nasal congestion, post nasal drip,   CV:  No chest pain,  Orthopnea, PND, swelling in lower extremities, anasarca, dizziness, palpitations, syncope.   GI  No heartburn, indigestion, abdominal pain, nausea, vomiting, diarrhea, change in bowel habits, loss of appetite, bloody stools.   Resp: + shortness of breath with exertion and at rest.  + excess mucus, + productive cough,  No non-productive cough,  No coughing up of blood.  + change in color of  mucus.  + wheezing.  No chest wall deformity  Skin: no rash or lesions.  GU: no dysuria, change in color of urine, no urgency or frequency.  No flank pain, no hematuria   MS:  No joint pain or swelling.  No decreased range of motion.  No back pain.  Psych:  No change in mood or affect. + depression or anxiety.  No memory loss.,   Vital Signs BP 136/78 (BP Location: Left Arm, Cuff Size: Normal)   Pulse (!) 107   Ht 5' 3.5" (1.613 m)   Wt 179 lb 6.4 oz (81.4 kg)   SpO2 97%   BMI 31.28 kg/m    Physical Exam:  General- No distress,  A&Ox3, deconditioned and anxious ENT: No sinus tenderness, TM clear, pale nasal mucosa, no oral exudate,no post nasal drip, no LAN Cardiac: S1, S2, regular rate and rhythm, no murmur Chest: No wheeze/ rales/ dullness;diminished per bases bilaterally, no accessory muscle use, no nasal flaring, no sternal retractions Abd.: Soft Non-tender Ext: No clubbing cyanosis, trace edema Neuro:  deconditioned Skin: No rashes, warm and dry Psych: normal mood and behavior   Assessment/Plan  COPD exacerbation (HCC) COPD Exacerbation Continued Tobacco Abuse Plan: Smoking cessation is the single most powerful action that you can take to improve your pulmonary function. Please work hard to quit smoking. We will walk you in the office today We will check a CXR today. We will call you with the results. Levaquin  500 mg daily x 5  Days Prednisone taper; 10 mg tablets: 4 tabs x 3 days, 3 tabs x 3 days, 2 tabs x 3 days 1 tab x 3 days then resume your 5 mg daily dose.. Fliutter valve, Mucinex  For cough and mobilization of secretions. Continue taking your Claritin 10 mg once daily Delsym cough syrup at night for sleep. Flu shot at follow up appointment. Consider sleep study at follow up. Follow up appointment with Dr. Lake Bells or NP in 3-4 weeks Please contact office for sooner follow up if symptoms do not improve or worsen or seek emergency care     Cigarette  smoker Continues to smoke 25 cigarettes daily despite worsening dyspnea Plan: Counseled extensively about smoking cessation, and choosing between breathing and smoking. Offered Quit Smart Smoking Cessation classes which she declined. Follow up in 3-4 weeks with NP or Dr. Lake Bells. Please contact office for sooner follow up if symptoms do not improve or worsen or seek emergency care     Magdalen Spatz, NP 11/01/2015  3:58 PM

## 2015-11-01 NOTE — Patient Instructions (Addendum)
It is nice to meet you today. Smoking cessation is the single most powerful action that you can take to improve your pulmonary function. Please work hard to quit smoking. We will walk you in the office today We will check a CXR today. We will call you with the results. Levaquin  500 mg daily x 5  Days Prednisone taper; 10 mg tablets: 4 tabs x 3 days, 3 tabs x 3 days, 2 tabs x 3 days 1 tab x 3 days then resume your 5 mg daily dose.. Fliutter valve, Mucinex  For cough and mobilization of secretions. Continue taking your Claritin 10 mg once daily Delsym cough syrup at night for sleep. Flu shot at follow up appointment. Consider sleep study at follow up. Follow up appointment with Dr. Lake Bells or NP in 3-4 weeks Please contact office for sooner follow up if symptoms do not improve or worsen or seek emergency care

## 2015-11-12 NOTE — Progress Notes (Signed)
lmtcb x2 

## 2015-11-13 ENCOUNTER — Ambulatory Visit (INDEPENDENT_AMBULATORY_CARE_PROVIDER_SITE_OTHER): Payer: Medicare Other | Admitting: Pulmonary Disease

## 2015-11-13 ENCOUNTER — Encounter: Payer: Self-pay | Admitting: Pulmonary Disease

## 2015-11-13 ENCOUNTER — Telehealth: Payer: Self-pay | Admitting: Pulmonary Disease

## 2015-11-13 VITALS — BP 160/90 | HR 102 | Temp 97.9°F | Ht 63.5 in | Wt 185.6 lb

## 2015-11-13 DIAGNOSIS — Z8659 Personal history of other mental and behavioral disorders: Secondary | ICD-10-CM | POA: Insufficient documentation

## 2015-11-13 DIAGNOSIS — F17200 Nicotine dependence, unspecified, uncomplicated: Secondary | ICD-10-CM

## 2015-11-13 DIAGNOSIS — F4310 Post-traumatic stress disorder, unspecified: Secondary | ICD-10-CM | POA: Insufficient documentation

## 2015-11-13 DIAGNOSIS — F17210 Nicotine dependence, cigarettes, uncomplicated: Secondary | ICD-10-CM | POA: Diagnosis not present

## 2015-11-13 DIAGNOSIS — J449 Chronic obstructive pulmonary disease, unspecified: Secondary | ICD-10-CM

## 2015-11-13 DIAGNOSIS — F411 Generalized anxiety disorder: Secondary | ICD-10-CM

## 2015-11-13 DIAGNOSIS — J302 Other seasonal allergic rhinitis: Secondary | ICD-10-CM

## 2015-11-13 DIAGNOSIS — F418 Other specified anxiety disorders: Secondary | ICD-10-CM | POA: Diagnosis not present

## 2015-11-13 DIAGNOSIS — F5101 Primary insomnia: Secondary | ICD-10-CM | POA: Diagnosis not present

## 2015-11-13 DIAGNOSIS — Z716 Tobacco abuse counseling: Secondary | ICD-10-CM | POA: Diagnosis not present

## 2015-11-13 MED ORDER — CLOTRIMAZOLE 10 MG MT LOZG: 10.0000 mg | LOZENGE | Freq: Four times a day (QID) | OROMUCOSAL | 0 refills | Status: AC | PRN

## 2015-11-13 MED ORDER — MONTELUKAST SODIUM 10 MG PO TABS: 10.0000 mg | ORAL_TABLET | Freq: Every day | ORAL | 3 refills | Status: DC

## 2015-11-13 NOTE — Progress Notes (Signed)
Subjective:    Patient ID: GLORIOUS SAFFER, female    DOB: [birth year], 60 y.o.   MRN: [id number]  C.C.:  Acute visit for Dyspnea with known Severe COPD & Tobacco Use Disorder.  HPI Dyspnea:  She reports she has had continued panic attacks. She is taking her Klonopin every 3 days or so. Patient reports she needs refill on her Klonopin. She reports dyspnea at rest but seems to be worse with exertion and even simply dressing.   Severe COPD: Patient last seen on 8/31 and diagnosed with COPD exacerbation treated with Levaquin for 5 days along with a prednisone taper. She did feel her breathing improved while on Prednisone but seemed to worsen once she stopped the Prednisone. She feels like her dyspnea and cough have increased. Cough is productive of a white to clear mucus. She reports she has compliant with her Symbicort & Spiriva but isn't using a spacer. She is using her nebulizer with rescue medication twice daily and using her rescue inhaler 6-8 times daily. She restarted taking Prednisone & took 20mg  today & 35mg  yesterday.   Tobacco Use Disorder:  She reports she has been smoking up to a pack a day but only 1/2ppd recently with her increased dyspnea.   Review of Systems She reports subjective fever, chills, and sweats. She reports chest tightness. She reports a pressure on her chest but no frank chest pain. She reports she has had diarrhea and nausea but no emesis recently.   Allergies  Allergen Reactions  . Penicillins Itching and Rash    Current Outpatient Prescriptions on File Prior to Visit  Medication Sig Dispense Refill  . albuterol (PROVENTIL HFA;VENTOLIN HFA) 108 (90 Base) MCG/ACT inhaler Inhale 2 puffs into the lungs every 6 (six) hours as needed for wheezing. 3 Inhaler 3  . albuterol (PROVENTIL) (2.5 MG/3ML) 0.083% nebulizer solution Take 3 mLs (2.5 mg total) by nebulization every 4 (four) hours as needed. 120 mL 2  . budesonide-formoterol (SYMBICORT) 160-4.5 MCG/ACT inhaler Take  2 puffs first thing in am and then another 2 puffs about 12 hours later. 1 Inhaler 3  . clonazePAM (KLONOPIN) 0.5 MG tablet Take 0.25 mg by mouth 3 (three) times daily as needed for anxiety.    Marland Kitchen dextromethorphan-guaiFENesin (MUCINEX DM) 30-600 MG per 12 hr tablet Take 1-2 tablets by mouth every 12 (twelve) hours. With water For congestion and cough     . fish oil-omega-3 fatty acids 1000 MG capsule Take 1 g by mouth daily.      Marland Kitchen losartan-hydrochlorothiazide (HYZAAR) 50-12.5 MG per tablet Take 1 tablet by mouth daily. 30 tablet 5  . mometasone (NASONEX) 50 MCG/ACT nasal spray Place 2 sprays into the nose daily. 17 g 11  . PARoxetine (PAXIL) 20 MG tablet Take 20 mg by mouth daily.    . predniSONE (DELTASONE) 5 MG tablet TAKE ONE (1) TABLET BY MOUTH EVERY DAY WITH BREAKFAST 90 tablet 1  . Respiratory Therapy Supplies (FLUTTER) DEVI Use as directed. 1 each 0  . sennosides (SENOKOT) 8.8 MG/5ML syrup Take 5 mLs by mouth 2 (two) times daily. 240 mL 0  . tiotropium (SPIRIVA) 18 MCG inhalation capsule Place 1 capsule (18 mcg total) into inhaler and inhale daily. 30 capsule 6  . traZODone (DESYREL) 50 MG tablet Take 50 mg by mouth at bedtime as needed for sleep.    . predniSONE (DELTASONE) 10 MG tablet Take 4 tabs x 3 days, 3 tabs x 3 days, 2 tabs x 3 days,  1 tab x 3 days (Patient not taking: Reported on 11/13/2015) 30 tablet 0  . Spacer/Aero-Holding Chambers (AEROCHAMBER MV) inhaler Use as instructed (Patient not taking: Reported on 11/13/2015) 1 each 0   No current facility-administered medications on file prior to visit.     Past Medical History:  Diagnosis Date  . Asthma   . COPD (chronic obstructive pulmonary disease) ([place])   . Depression   . Hypertension   . PTSD (post-traumatic stress disorder)     Past Surgical History:  Procedure Laterality Date  . TONSILLECTOMY      Family History  Problem Relation Age of Onset  . Rheum arthritis Mother   . Heart failure Father   . Cancer Father       prostate  . Alcoholism Sister     Social History   Social History  . Marital status: Divorced    Spouse name: N/A  . Number of children: N/A  . Years of education: N/A   Social History Main Topics  . Smoking status: Current Every Day Smoker    Packs/day: 1.50    Years: 33.00    Types: Cigarettes  . Smokeless tobacco: Never Used     Comment: Smoking 25 cigs daily 11/01/15 alc  . Alcohol use 8.4 oz/week    14 Glasses of wine per week  . Drug use: No  . Sexual activity: Not Currently   Other Topics Concern  . None   Social History Narrative  . None      Objective:   Physical Exam BP (!) 160/90 (BP Location: Left Arm, Cuff Size: Normal)   Pulse (!) 102   Temp 97.9 F (36.6 C) (Oral)   Ht 5' 3.5" (1.613 m)   Wt 185 lb 9.6 oz (84.2 kg)   SpO2 97%   BMI 32.36 kg/m  General:  Awake. Alert. Very anxious. No acute distress.  Integument:  Warm & dry. No rash on exposed skin.  HEENT:  Tacky mucus membranes. No oral ulcers. No scleral icterus.  Cardiovascular:  Regular rhythm. Mildly tachycardic. No edema.  Pulmonary:  Good aeration & clear to auscultation bilaterally. No accessory muscle use on room air. Speaking in complete sentences. Abdomen: Soft. Normal bowel sounds. Protuberant. Musculoskeletal:  Normal bulk and tone. No joint deformity or effusion appreciated.  PFT 07/28/13: FVC 1.91 L (62%) FEV1 1.03 L (41%) FEV1/FVC 0.54 FEF 25-75 0.51 L (19%)  IMAGING CXR PA/LAT 11/01/15 (personally reviewed by me): No focal opacity or mass appreciated. No pleural effusion. Heart normal in size & mediastinum normal in contour. Low lung volumes.    Assessment & Plan:  60 y.o. female with underlying severe COPD and ongoing tobacco use disorder. Patient does have significant anxiety and given her pulmonary examination today I do not believe she is in the throes of a COPD exacerbation. I do feel that her ongoing tobacco use is negatively affecting the control of her COPD. Certainly  her anxiety disorder is a very real and immediate contributing factor to her overall feeling of shortness of breath. Currently she is on Paxil has a very out of day prescription for Klonopin which she has been using intermittently to help combat her anxiety disorder. I did spend a significant amount of time today educating the patient on the need for control of her underlying anxiety/PTSD/depression. I instructed the patient to contact our office if she had any new breathing problems or questions before her next appointment.  1. Severe COPD:Patient continuing on Symbicort & Spiriva.  Reeducated on the need for utilizing her rescue inhaler medications as prescribed. 2. Tobacco Use Disorder:  Patient to be evaluated for medication adjustments today for her psychiatric illnesses. Patient counseled for over 3 minutes on the need for complete tobacco cessation. 3. Anxiety Disorder/PTSD/Depression:  Patient to be evaluated today at 2:45pm by her PCP's office. 4. Seasonal Allergic Rhinitis:  Continuing patient on Claritin & restarting Singulair 10 mg by mouth daily at bedtime. 5. Follow-up:  Patient will keep follow-up with Dr. Lake Bells 09/26.   Sonia Baller Ashok Cordia, M.D. [facility] Pager:  [phone] After 3pm or if no response, call [phone] 12:15 PM 11/13/15

## 2015-11-13 NOTE — Patient Instructions (Signed)
   Continue using your inhalers as prescribed.  I'm starting you back on Singulair to help with your allergies. You can continue taking your Claritin.  If you feel like your breathing is getting any worse please be evaluated in the Emergency Department immediately.  Dr. Clayton Bibles is going to see you at 2:45pm today to address your anxiety/depression/PTSD.  We are keeping your appointment with Dr. Lake Bells as scheduled on 9/26.

## 2015-11-13 NOTE — Telephone Encounter (Signed)
Spoke with pt in the lobby. Her oxygen level is at 97% on room air. Pt seemed very anxious but was not in distress. Advised her that I spoke with JN's nurse and that she was aware that she was here. Also advised her that if she starts to feel worse she needs to let the girls at the front desk know so that we can treat her accordingly. We will continue to check on periodically while she is in the waiting area. She verbalized understanding and was appreciative that we were going to see her today. Nothing further was needed at this time.

## 2015-11-14 ENCOUNTER — Ambulatory Visit: Payer: Medicare Other | Admitting: Pulmonary Disease

## 2015-11-21 ENCOUNTER — Telehealth: Payer: Self-pay | Admitting: Pulmonary Disease

## 2015-11-21 MED ORDER — BUDESONIDE-FORMOTEROL FUMARATE 160-4.5 MCG/ACT IN AERO: INHALATION_SPRAY | RESPIRATORY_TRACT | 0 refills | Status: DC

## 2015-11-21 MED ORDER — BUDESONIDE-FORMOTEROL FUMARATE 160-4.5 MCG/ACT IN AERO: INHALATION_SPRAY | RESPIRATORY_TRACT | 6 refills | Status: DC

## 2015-11-21 NOTE — Telephone Encounter (Signed)
This is BQ patient Refills for symbicort has been sent to local pharmacy and health dept. Called pt and LMTCB

## 2015-11-22 NOTE — Telephone Encounter (Signed)
Patient called back - informed her that both rx's have been sent to the pharmacy -pr

## 2015-11-22 NOTE — Telephone Encounter (Signed)
lmtcb X2 for pt to make aware that meds have been sent to pharmacies.

## 2015-11-27 ENCOUNTER — Ambulatory Visit (INDEPENDENT_AMBULATORY_CARE_PROVIDER_SITE_OTHER): Payer: Medicare Other | Admitting: Pulmonary Disease

## 2015-11-27 ENCOUNTER — Encounter: Payer: Self-pay | Admitting: Pulmonary Disease

## 2015-11-27 VITALS — BP 132/76 | HR 93 | Ht 63.5 in | Wt 179.0 lb

## 2015-11-27 DIAGNOSIS — Z8659 Personal history of other mental and behavioral disorders: Secondary | ICD-10-CM

## 2015-11-27 DIAGNOSIS — R5382 Chronic fatigue, unspecified: Secondary | ICD-10-CM

## 2015-11-27 DIAGNOSIS — G4733 Obstructive sleep apnea (adult) (pediatric): Secondary | ICD-10-CM | POA: Insufficient documentation

## 2015-11-27 DIAGNOSIS — G4710 Hypersomnia, unspecified: Secondary | ICD-10-CM

## 2015-11-27 DIAGNOSIS — F17200 Nicotine dependence, unspecified, uncomplicated: Secondary | ICD-10-CM | POA: Diagnosis not present

## 2015-11-27 DIAGNOSIS — Z23 Encounter for immunization: Secondary | ICD-10-CM

## 2015-11-27 DIAGNOSIS — J449 Chronic obstructive pulmonary disease, unspecified: Secondary | ICD-10-CM

## 2015-11-27 DIAGNOSIS — R5383 Other fatigue: Secondary | ICD-10-CM

## 2015-11-27 DIAGNOSIS — R400 Somnolence: Secondary | ICD-10-CM

## 2015-11-27 NOTE — Assessment & Plan Note (Signed)
Stable interval for her. No exacerbations for at least a couple months at this point. She does have severe disease. There may be a role for rest of repeat spirometry at some point in the next year but I don't see a need for that right now.  Plan: Flu shot today Continue Symbicort and Spiriva

## 2015-11-27 NOTE — Assessment & Plan Note (Signed)
Counseled at length to quit smoking today. She's frustrated by her lack of progress. I advised that it takes many people multiple tries to quit smoking. We'll provide her with smoking cessation information today.

## 2015-11-27 NOTE — Patient Instructions (Signed)
Read the literature we gave you and try your best to stop smoking Keep taking inhaled medicines as you're doing We'll arrange for a polysomnogram We will see you back in 3 months or sooner if needed

## 2015-11-27 NOTE — Progress Notes (Signed)
Subjective:    Patient ID: Jenny Branch, female    DOB: [birth year], 60 y.o.   MRN: [id number]   Synopsis: GOLD Grade D, 07/29/2011 simple spirometry> ratio 54%, FEV1 1.03 L (41% predicted), current smoker as of 07/2015 despite a hospitalization in 05/2015 with respiratory failure on a ventilator.    HPI Chief Complaint  Patient presents with  . Follow-up    pt c/o increased fatigue, SOB at rest and with exertion, prod cough with white mucus- unchanged since last office visit.    Jenny Branch feels like her breathing is a little better.    She is tired all the time.    She still feels short of breath with little activity like vacuuming, loading the dishwasher, changing the bed.   In general her breathing is much better than when she had her exacerbations in the last few months.    She has been able to see that her breathing is worse with anxiety.   She is still smoking cigarettes, trying to cut back on cigarettes as much as possible.   Past Medical History:  Diagnosis Date  . Asthma   . COPD (chronic obstructive pulmonary disease) ([place])   . Depression   . Hypertension   . PTSD (post-traumatic stress disorder)      Review of Systems  Constitutional: Negative for chills, fatigue and fever.  HENT: Negative for postnasal drip, rhinorrhea and sinus pressure.   Respiratory: Positive for cough. Negative for shortness of breath and wheezing.   Cardiovascular: Negative for chest pain, palpitations and leg swelling.       Objective:   Physical Exam Vitals:   11/27/15 1514  BP: 132/76  BP Location: Right Arm  Cuff Size: Normal  Pulse: 93  SpO2: 95%  Weight: 179 lb (81.2 kg)  Height: 5' 3.5" (1.613 m)   RA  Gen: well appearing, no acute distress HEENT: NCAT,  EOMi, OP clear, PULM: Few wheezes bilaterally, air movement normal today CV: RRR, no mgr, no JVD AB: BS+, soft, nontender,  Ext: warm, no edema, no clubbing, no cyanosis Derm: no rash or skin breakdown    March 2017  discharge summary reviewed were she was treated for a COPD exacerbation requiring mechanical ventilation.  Assessment & Plan:   Fatigue Ly has struggled with fatigue since her ICU stay earlier this year. Her fatigue is complicated by obesity, multiple comorbid illnesses including COPD and depression. She is at high risk for having obstructive sleep apnea and given the fact that she has frequent daytime somnolence I fear this may be contributing to her symptoms.  Plan: Polysomnogram  Tobacco use disorder Counseled at length to quit smoking today. She's frustrated by her lack of progress. I advised that it takes many people multiple tries to quit smoking. We'll provide her with smoking cessation information today.  COPD GOLD III  Stable interval for her. No exacerbations for at least a couple months at this point. She does have severe disease. There may be a role for rest of repeat spirometry at some point in the next year but I don't see a need for that right now.  Plan: Flu shot today Continue Symbicort and Spiriva  H/O: depression I agree that her mental health is contributing significantly to the severity of her fatigue and perception of dyspnea. Appreciate her primary care physician helping in this regard. She'll have a another appointment with her in October and I have encouraged her to keep this.   Updated Medication List  Outpatient Encounter Prescriptions as of 11/27/2015  Medication Sig  . albuterol (PROVENTIL HFA;VENTOLIN HFA) 108 (90 Base) MCG/ACT inhaler Inhale 2 puffs into the lungs every 6 (six) hours as needed for wheezing.  Marland Kitchen albuterol (PROVENTIL) (2.5 MG/3ML) 0.083% nebulizer solution Take 3 mLs (2.5 mg total) by nebulization every 4 (four) hours as needed.  . budesonide-formoterol (SYMBICORT) 160-4.5 MCG/ACT inhaler Take 2 puffs first thing in am and then another 2 puffs about 12 hours later.  . clonazePAM (KLONOPIN) 0.5 MG tablet Take 0.25 mg by mouth 3 (three) times  daily as needed for anxiety.  . clotrimazole (MYCELEX) 10 MG troche Take 1 lozenge (10 mg total) by mouth 4 (four) times daily as needed.  Marland Kitchen dextromethorphan-guaiFENesin (MUCINEX DM) 30-600 MG per 12 hr tablet Take 1-2 tablets by mouth every 12 (twelve) hours. With water For congestion and cough   . fish oil-omega-3 fatty acids 1000 MG capsule Take 1 g by mouth daily.    Marland Kitchen losartan-hydrochlorothiazide (HYZAAR) 50-12.5 MG per tablet Take 1 tablet by mouth daily.  . mometasone (NASONEX) 50 MCG/ACT nasal spray Place 2 sprays into the nose daily.  . montelukast (SINGULAIR) 10 MG tablet Take 1 tablet (10 mg total) by mouth at bedtime.  Marland Kitchen PARoxetine (PAXIL) 20 MG tablet Take 20 mg by mouth daily.  . predniSONE (DELTASONE) 5 MG tablet TAKE ONE (1) TABLET BY MOUTH EVERY DAY WITH BREAKFAST  . Respiratory Therapy Supplies (FLUTTER) DEVI Use as directed.  . sennosides (SENOKOT) 8.8 MG/5ML syrup Take 5 mLs by mouth 2 (two) times daily.  Marland Kitchen Spacer/Aero-Holding Chambers (AEROCHAMBER MV) inhaler Use as instructed  . tiotropium (SPIRIVA) 18 MCG inhalation capsule Place 1 capsule (18 mcg total) into inhaler and inhale daily.  . traZODone (DESYREL) 50 MG tablet Take 50 mg by mouth at bedtime as needed for sleep.  . [DISCONTINUED] predniSONE (DELTASONE) 10 MG tablet Take 4 tabs x 3 days, 3 tabs x 3 days, 2 tabs x 3 days, 1 tab x 3 days (Patient not taking: Reported on 11/27/2015)   No facility-administered encounter medications on file as of 11/27/2015.

## 2015-11-27 NOTE — Assessment & Plan Note (Signed)
I agree that her mental health is contributing significantly to the severity of her fatigue and perception of dyspnea. Appreciate her primary care physician helping in this regard. She'll have a another appointment with her in October and I have encouraged her to keep this.

## 2015-11-27 NOTE — Assessment & Plan Note (Signed)
Jenny Branch has struggled with fatigue since her ICU stay earlier this year. Her fatigue is complicated by obesity, multiple comorbid illnesses including COPD and depression. She is at high risk for having obstructive sleep apnea and given the fact that she has frequent daytime somnolence I fear this may be contributing to her symptoms.  Plan: Polysomnogram

## 2016-01-02 ENCOUNTER — Telehealth: Payer: Self-pay | Admitting: Pulmonary Disease

## 2016-01-02 ENCOUNTER — Other Ambulatory Visit: Payer: Self-pay

## 2016-01-02 MED ORDER — PREDNISONE 10 MG PO TABS: ORAL_TABLET | ORAL | 0 refills | Status: DC

## 2016-01-02 MED ORDER — AZITHROMYCIN 250 MG PO TABS: ORAL_TABLET | ORAL | 0 refills | Status: AC

## 2016-01-02 MED ORDER — TIOTROPIUM BROMIDE MONOHYDRATE 18 MCG IN CAPS: 18.0000 ug | ORAL_CAPSULE | Freq: Every day | RESPIRATORY_TRACT | 3 refills | Status: DC

## 2016-01-02 NOTE — Telephone Encounter (Signed)
Prednisone taper 40mg  daily for three days, 30mg  daily for three days, 20mg  daily for three days, then 10mg  daily for three days Z pack

## 2016-01-02 NOTE — Telephone Encounter (Signed)
Spoke with pt. States that she is having a problem with chest congestion. Reports cough, SOB, chest tightness, wheezing. Cough is non productive. Denies fever. Has been taking Mucinex DM with no relief. Would like something to be called in.  BQ - please advise. Thanks.

## 2016-01-02 NOTE — Telephone Encounter (Signed)
lmtcb x1 for pt. 

## 2016-01-02 NOTE — Telephone Encounter (Signed)
With the nature of this message, I have left a detailed message with the pt letting her know BQ's recommendations. Rxs have been sent to the pharmacy in pt's chart. Will leave message open to follow up with the pt in the morning.

## 2016-01-03 ENCOUNTER — Other Ambulatory Visit: Payer: Self-pay | Admitting: Pulmonary Disease

## 2016-01-03 NOTE — Telephone Encounter (Signed)
lmomtcb x 2  

## 2016-01-07 ENCOUNTER — Ambulatory Visit (HOSPITAL_BASED_OUTPATIENT_CLINIC_OR_DEPARTMENT_OTHER): Payer: Medicare Other | Attending: Pulmonary Disease | Admitting: Pulmonary Disease

## 2016-01-07 VITALS — Ht 63.5 in | Wt 175.0 lb

## 2016-01-07 DIAGNOSIS — R5383 Other fatigue: Secondary | ICD-10-CM

## 2016-01-07 DIAGNOSIS — Z6831 Body mass index (BMI) 31.0-31.9, adult: Secondary | ICD-10-CM | POA: Insufficient documentation

## 2016-01-07 DIAGNOSIS — G4733 Obstructive sleep apnea (adult) (pediatric): Secondary | ICD-10-CM | POA: Insufficient documentation

## 2016-01-07 DIAGNOSIS — Z7982 Long term (current) use of aspirin: Secondary | ICD-10-CM | POA: Insufficient documentation

## 2016-01-07 DIAGNOSIS — G4761 Periodic limb movement disorder: Secondary | ICD-10-CM | POA: Diagnosis not present

## 2016-01-07 DIAGNOSIS — E669 Obesity, unspecified: Secondary | ICD-10-CM | POA: Insufficient documentation

## 2016-01-07 DIAGNOSIS — Z7951 Long term (current) use of inhaled steroids: Secondary | ICD-10-CM | POA: Diagnosis not present

## 2016-01-07 DIAGNOSIS — Z79899 Other long term (current) drug therapy: Secondary | ICD-10-CM | POA: Diagnosis not present

## 2016-01-07 DIAGNOSIS — R400 Somnolence: Secondary | ICD-10-CM

## 2016-01-07 DIAGNOSIS — F329 Major depressive disorder, single episode, unspecified: Secondary | ICD-10-CM | POA: Insufficient documentation

## 2016-01-07 DIAGNOSIS — I10 Essential (primary) hypertension: Secondary | ICD-10-CM | POA: Insufficient documentation

## 2016-01-07 NOTE — Telephone Encounter (Signed)
lmtcb x3 for pt. Per triage protocol, message will be closed.  

## 2016-01-08 DIAGNOSIS — G4733 Obstructive sleep apnea (adult) (pediatric): Secondary | ICD-10-CM | POA: Diagnosis not present

## 2016-01-08 NOTE — Procedures (Signed)
   Patient Name: Jenny Branch, Velie Date: 01/07/2016 Gender: Female D.O.B: 04-Oct-1955 Age (years): 72 Referring Provider: [name] Height (inches): 79 Interpreting Physician: Chesley Mires MD, ABSM Weight (lbs): 175 RPSGT: Carolin Coy BMI: 31 MRN: [id number] Neck Size: 15.00  CLINICAL INFORMATION Sleep Study Type: NPSG   Indication for sleep study: Daytime Fatigue, Depression, Fatigue, Hypertension, Obesity, Snoring   SLEEP STUDY TECHNIQUE As per the AASM Manual for the Scoring of Sleep and Associated Events v2.3 (April 2016) with a hypopnea requiring 4% desaturations. The channels recorded and monitored were frontal, central and occipital EEG, electrooculogram (EOG), submentalis EMG (chin), nasal and oral airflow, thoracic and abdominal wall motion, anterior tibialis EMG, snore microphone, electrocardiogram, and pulse oximetry.  MEDICATIONS Medications self-administered by patient taken the night of the study : KLONOPIN, SYMBICORT, FISH OIL, ASPIRIN, PRILOSEC, diclofenac sodium topical gel  SLEEP ARCHITECTURE The study was initiated at 11:26:38 PM and ended at 5:58:21 AM. Sleep onset time was 23.2 minutes and the sleep efficiency was 78.1%. The total sleep time was 306.0 minutes. Stage REM latency was 296.0 minutes. The patient spent 34.80% of the night in stage N1 sleep, 51.80% in stage N2 sleep, 0.00% in stage N3 and 13.40% in REM. Alpha intrusion was absent. Supine sleep was 5.13%.  RESPIRATORY PARAMETERS The overall apnea/hypopnea index (AHI) was 21.6 per hour. There were 0 total apneas, including 0 obstructive, 0 central and 0 mixed apneas. There were 110 hypopneas and 28 RERAs. The AHI during Stage REM sleep was 23.4 per hour. AHI while supine was 34.4 per hour. The mean oxygen saturation was 89.95%. The minimum SpO2 during sleep was 82.00%. Moderate snoring was noted during this study.  CARDIAC DATA The 2 lead EKG demonstrated sinus rhythm. The mean heart  rate was 82.22 beats per minute. Other EKG findings include: PVCs.  LEG MOVEMENT DATA The total PLMS were 129 with a resulting PLMS index of 25.29. Associated arousal with leg movement index was 2.9 .  IMPRESSIONS - This study shows moderate obstructive sleep apnea with an AHI of 21.6 and SpO2 low of 82%. - She had an increase in her periodic limb movement index.  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 - She should be assessed for the presence of restless leg syndrome.  [Electronically signed] 01/08/2016 06:27 PM  Chesley Mires MD, [place], American Board of Sleep Medicine   NPI: [id number]

## 2016-01-14 ENCOUNTER — Encounter: Payer: Self-pay | Admitting: Pulmonary Disease

## 2016-01-14 NOTE — Progress Notes (Signed)
Jenny Branch, this showed obstructive sleep apnea and possible restless legs syndrome.  Please arrange for an autotitrating RESMED 10 device set from 5-20cm H20 with a download in 3 weeks.  Also she needs to have a ferritin lab checked.

## 2016-01-15 ENCOUNTER — Telehealth: Payer: Self-pay | Admitting: Pulmonary Disease

## 2016-01-15 NOTE — Telephone Encounter (Signed)
Juanito Doom, MD at 01/07/2016 8:00 PM   Status: Signed    Caryl Pina, this showed obstructive sleep apnea and possible restless legs syndrome.  Please arrange for an autotitrating RESMED 10 device set from 5-20cm H20 with a download in 3 weeks.  Also she needs to have a ferritin lab checked.     lmtcb X1 for pt to relay results/recs.   Will order cpap and lab after speaking to pt.

## 2016-01-22 NOTE — Telephone Encounter (Signed)
Patient returning call - she can be reached at [phone] -pr

## 2016-01-22 NOTE — Telephone Encounter (Signed)
lmtcb x1 for pt. 

## 2016-03-13 ENCOUNTER — Other Ambulatory Visit: Payer: Self-pay

## 2016-03-14 MED ORDER — BUDESONIDE-FORMOTEROL FUMARATE 160-4.5 MCG/ACT IN AERO: INHALATION_SPRAY | RESPIRATORY_TRACT | 6 refills | Status: DC

## 2016-04-03 ENCOUNTER — Telehealth: Payer: Self-pay | Admitting: Pulmonary Disease

## 2016-04-03 NOTE — Telephone Encounter (Signed)
lmomtcb x1 

## 2016-04-04 MED ORDER — BUDESONIDE-FORMOTEROL FUMARATE 160-4.5 MCG/ACT IN AERO: INHALATION_SPRAY | RESPIRATORY_TRACT | 0 refills | Status: DC

## 2016-04-04 NOTE — Telephone Encounter (Signed)
Patient called back regarding samples - she will be completely out tonight - she is at her mother's she can be reached at [phone] -pr

## 2016-04-04 NOTE — Telephone Encounter (Signed)
lmtcb

## 2016-04-04 NOTE — Telephone Encounter (Signed)
Called and spoke with pts mother and she is aware of the samples that have been left up front for her to come by and pick these up.  Nothing further is needed.

## 2016-05-01 ENCOUNTER — Telehealth: Payer: Self-pay | Admitting: Pulmonary Disease

## 2016-05-01 MED ORDER — BUDESONIDE-FORMOTEROL FUMARATE 160-4.5 MCG/ACT IN AERO: INHALATION_SPRAY | RESPIRATORY_TRACT | 3 refills | Status: DC

## 2016-05-01 MED ORDER — TIOTROPIUM BROMIDE MONOHYDRATE 18 MCG IN CAPS: 18.0000 ug | ORAL_CAPSULE | Freq: Every day | RESPIRATORY_TRACT | 3 refills | Status: DC

## 2016-05-01 NOTE — Telephone Encounter (Signed)
Spoke with pt, advised that we do not have spiriva samples in office at this time.  Pt requesting rx for symbicort and spiriva to be sent to [facility].  These have been sent.  nothing further needed.

## 2016-06-16 ENCOUNTER — Other Ambulatory Visit: Payer: Self-pay | Admitting: Pulmonary Disease

## 2016-09-17 ENCOUNTER — Other Ambulatory Visit: Payer: Self-pay | Admitting: Pulmonary Disease

## 2016-09-24 DIAGNOSIS — H25093 Other age-related incipient cataract, bilateral: Secondary | ICD-10-CM | POA: Diagnosis not present

## 2016-10-21 ENCOUNTER — Other Ambulatory Visit: Payer: Self-pay | Admitting: Pulmonary Disease

## 2016-11-01 IMAGING — DX DG ABD PORTABLE 1V
1 series · 1 of 1 positions shown · non-contrast
Comparison: 05/09/2015

CLINICAL DATA: Abdominal distension.

EXAM:
PORTABLE ABDOMEN - 1 VIEW

[abdomen kub]
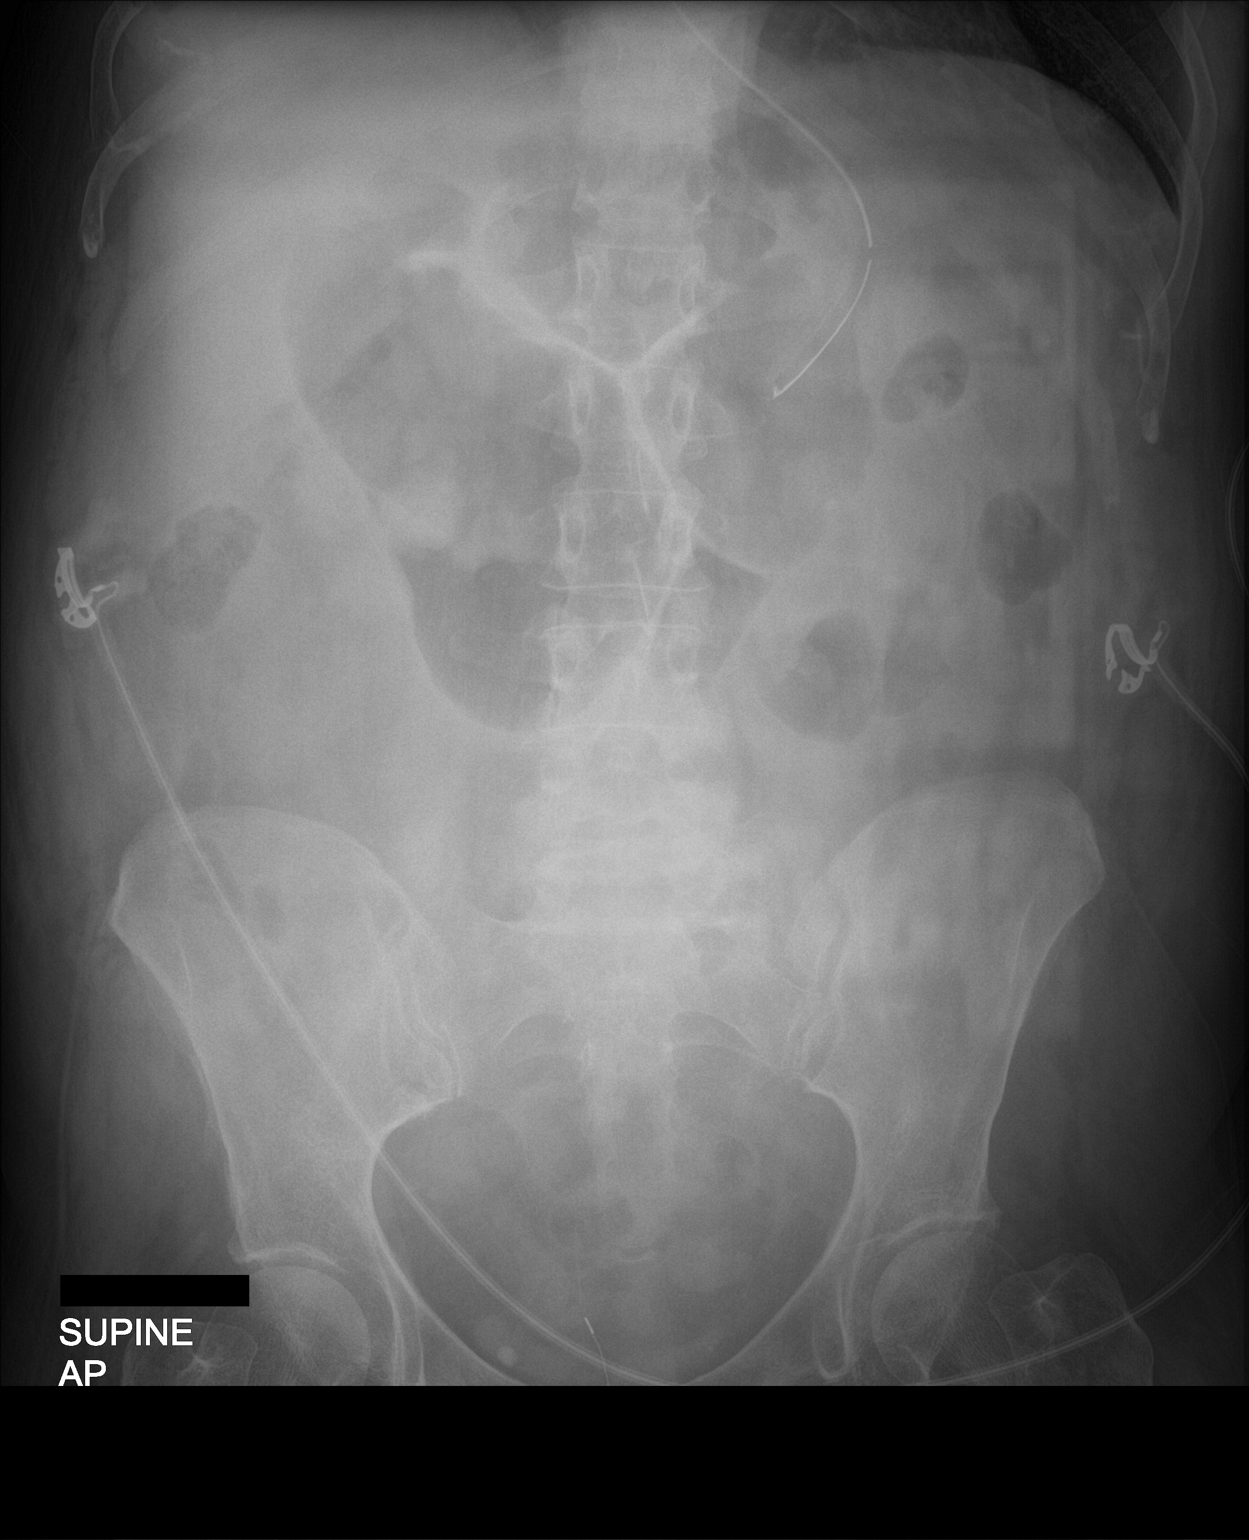

[1 of 1 positions shown; findings below may reference images not displayed]

FINDINGS: No bowel dilation is seen to suggest obstruction or significant
adynamic ileus.

Oral/ nasogastric tube is stable with its tip in the proximal
stomach.

Soft tissues are unremarkable.
IMPRESSION: 1. No acute findings. No evidence of bowel obstruction or
significant adynamic ileus.

## 2016-11-10 IMAGING — DX DG CHEST 1V PORT
1 series · 1 of 1 positions shown · non-contrast
Comparison: 05/17/2015 chest radiograph.

CLINICAL DATA: Acute respiratory failure

EXAM:
PORTABLE CHEST 1 VIEW

[chest ap]
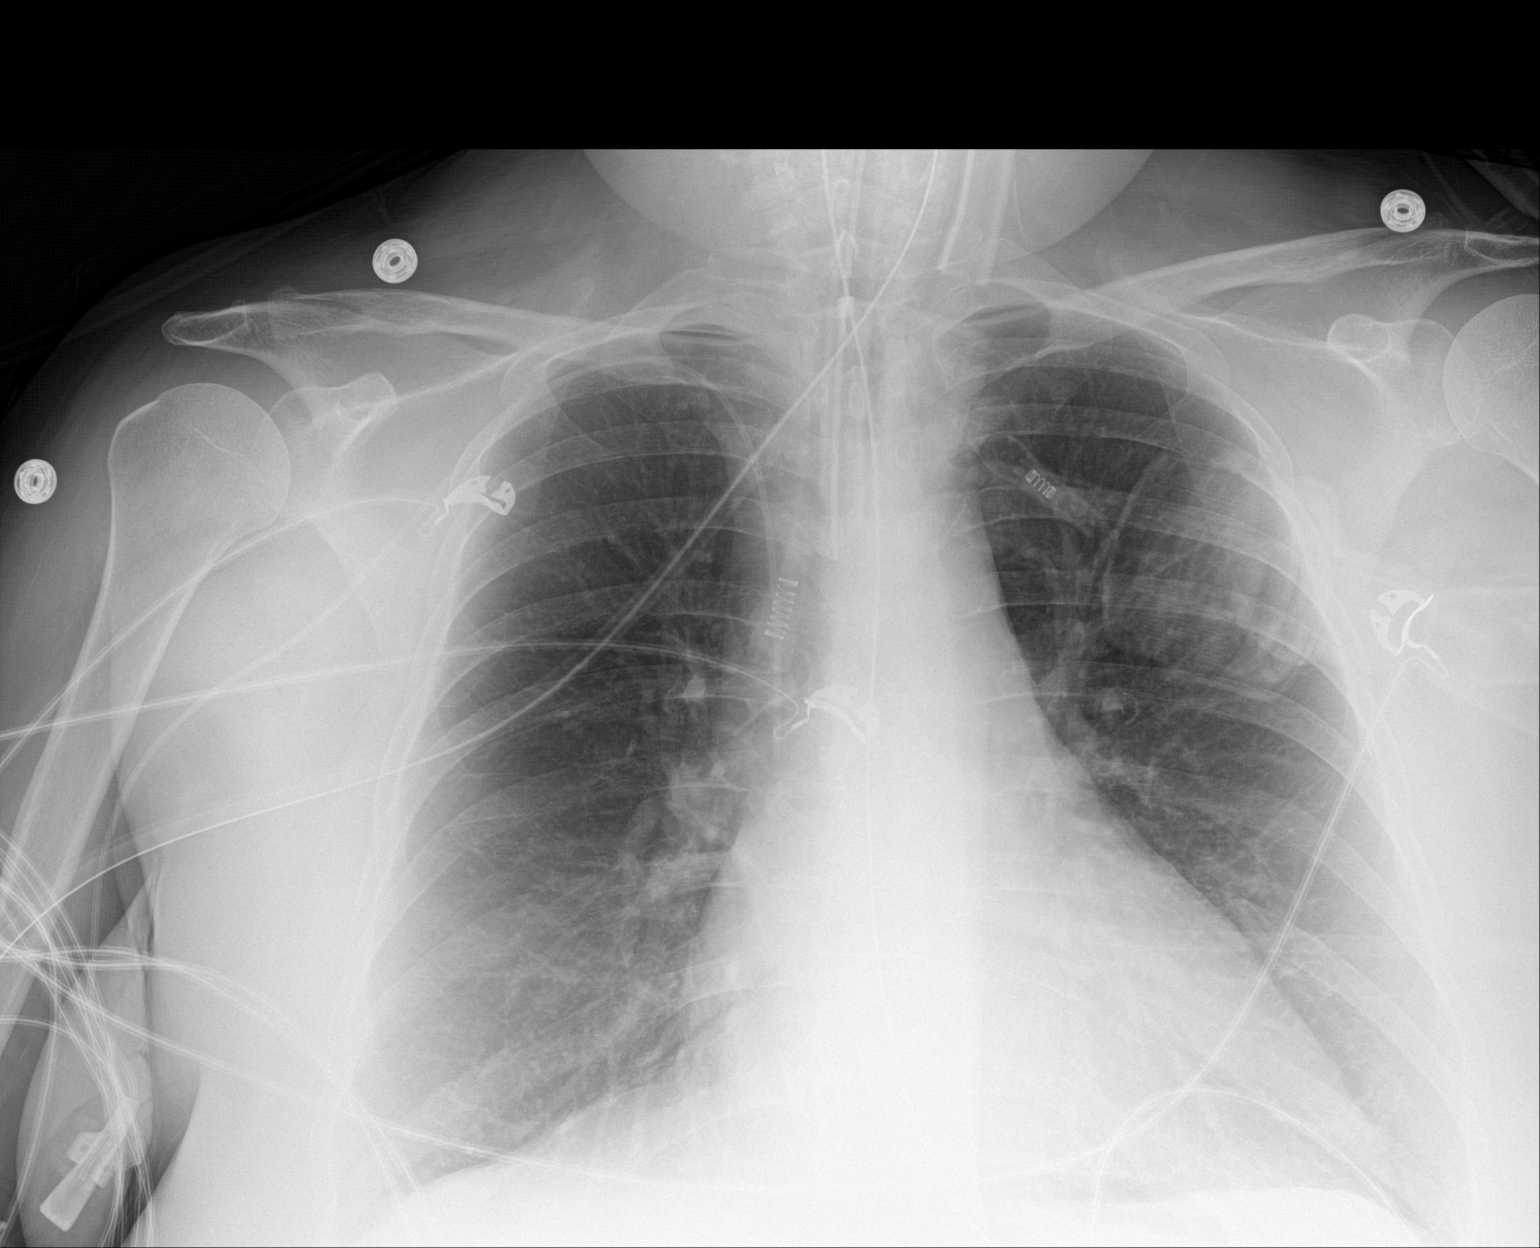

[1 of 1 positions shown; findings below may reference images not displayed]

FINDINGS: Endotracheal tube tip is 3.3 cm above the carina. Right PICC
terminates in the lower third of the superior vena cava. Enteric
tube enters the stomach with the tip not seen on this image. Stable
cardiomediastinal silhouette with top-normal heart size. No
pneumothorax. No pleural effusion. Lungs appear clear, with no acute
consolidative airspace disease and no pulmonary edema.
IMPRESSION: Well-positioned support hardware as described. No active
cardiopulmonary disease.

## 2016-11-15 ENCOUNTER — Other Ambulatory Visit: Payer: Self-pay | Admitting: Pulmonary Disease

## 2016-11-24 ENCOUNTER — Other Ambulatory Visit: Payer: Self-pay | Admitting: Pulmonary Disease

## 2016-11-26 ENCOUNTER — Telehealth: Payer: Self-pay | Admitting: Pulmonary Disease

## 2016-11-26 MED ORDER — BUDESONIDE-FORMOTEROL FUMARATE 160-4.5 MCG/ACT IN AERO: INHALATION_SPRAY | RESPIRATORY_TRACT | 0 refills | Status: DC

## 2016-11-26 MED ORDER — TIOTROPIUM BROMIDE MONOHYDRATE 18 MCG IN CAPS
1.0000 | ORAL_CAPSULE | Freq: Every day | RESPIRATORY_TRACT | 0 refills | Status: DC
Start: 2016-11-26 — End: 2016-12-25

## 2016-11-26 NOTE — Telephone Encounter (Signed)
Spoke with pt advised her that she would have to come in because she has not been seen in a year. She made an appt with BQ on 12/24/16 at 4pm. I sent in one refill. Nothing further is needed.

## 2016-12-24 ENCOUNTER — Ambulatory Visit: Payer: Medicare Other | Admitting: Pulmonary Disease

## 2016-12-25 ENCOUNTER — Ambulatory Visit (INDEPENDENT_AMBULATORY_CARE_PROVIDER_SITE_OTHER): Payer: Medicare Other | Admitting: Pulmonary Disease

## 2016-12-25 ENCOUNTER — Other Ambulatory Visit: Payer: Self-pay | Admitting: Pulmonary Disease

## 2016-12-25 ENCOUNTER — Encounter: Payer: Self-pay | Admitting: Pulmonary Disease

## 2016-12-25 VITALS — BP 116/68 | HR 100 | Ht 63.0 in | Wt 146.8 lb

## 2016-12-25 DIAGNOSIS — F17200 Nicotine dependence, unspecified, uncomplicated: Secondary | ICD-10-CM | POA: Diagnosis not present

## 2016-12-25 DIAGNOSIS — J449 Chronic obstructive pulmonary disease, unspecified: Secondary | ICD-10-CM | POA: Diagnosis not present

## 2016-12-25 MED ORDER — TIOTROPIUM BROMIDE MONOHYDRATE 18 MCG IN CAPS: 18.0000 ug | ORAL_CAPSULE | Freq: Every day | RESPIRATORY_TRACT | 0 refills | Status: DC

## 2016-12-25 MED ORDER — BUDESONIDE-FORMOTEROL FUMARATE 160-4.5 MCG/ACT IN AERO: 2.0000 | INHALATION_SPRAY | Freq: Two times a day (BID) | RESPIRATORY_TRACT | 0 refills | Status: DC

## 2016-12-25 NOTE — Patient Instructions (Signed)
COPD: Quit smoking Take Symbicort 2 puffs twice a day no matter how you feel Take Spiriva 2 puffs daily no matter how you feel Use albuterol as needed for chest tightness wheezing or shortness of breath  Tobacco abuse: Stop smoking Follow the recommendations we gave you for local resources for smoking cessation help  We will see you back in 4-6 months or sooner if needed

## 2016-12-25 NOTE — Progress Notes (Signed)
Subjective:    Patient ID: Jenny Branch, female    DOB: Dec 11, 1955, 61 y.o.   MRN: [id number]   Synopsis: GOLD Grade D, 07/29/2011 simple spirometry> ratio 54%, FEV1 1.03 L (41% predicted), current smoker as of 07/2015 despite a hospitalization in 05/2015 with respiratory failure on a ventilator.   Likes to avoid prednisone.    HPI Chief Complaint  Patient presents with  . Follow-up    Pt is doing well overall.    Jenny Branch said she has been breathing a little better in the last few days with the cooler weather.  However in the last year she hasn't needed treatment for a COPD exacerbation.  She says that she had some bad breathing spells recently but she feared taking prednisone so she didn't come here.  No hospitalizations.  She has been taking Symbicort, spiriva, ventolin and an albuterol nebulizer.  She uses a nebulizer when she gets really sick.  She thinks she may have used it 6 times in the last 6 months.  She will use ventolin about every other day.    She is still smoking 1 pack to 1.5 packs per day.  She has tried to cut back, some days she had to stop it.    She had a flu shot last week.  She had a pneumonia vaccine two years ago.    Past Medical History:  Diagnosis Date  . Asthma   . COPD (chronic obstructive pulmonary disease) ([place])   . Depression   . Hypertension   . PTSD (post-traumatic stress disorder)      Review of Systems  Constitutional: Negative for chills, fatigue and fever.  HENT: Negative for postnasal drip, rhinorrhea and sinus pressure.   Respiratory: Positive for cough. Negative for shortness of breath and wheezing.   Cardiovascular: Negative for chest pain, palpitations and leg swelling.       Objective:   Physical Exam Vitals:   12/25/16 1624  BP: 116/68  Pulse: 100  SpO2: 90%  Weight: 146 lb 12.8 oz (66.6 kg)  Height: 5\' 3"  (1.6 m)   RA  Gen: well appearing HENT: OP clear, TM's clear, neck supple PULM: CTA B, normal percussion CV: RRR,  no mgr, trace edema GI: BS+, soft, nontender Derm: no cyanosis or rash Psyche: normal mood and affect     Assessment & Plan:   COPD GOLD III   Tobacco use disorder  Discussion: This has been remarkably stable interval for [facility].  She actually looks better today than I have seen her in years.  I have encouraged her to quit smoking and provided her with smoking cessation information today.  Her flu shot is up-to-date.  She has been compliant with her controller medicines.  Plan: COPD: Quit smoking Take Symbicort 2 puffs twice a day no matter how you feel Take Spiriva 2 puffs daily no matter how you feel Use albuterol as needed for chest tightness wheezing or shortness of breath  Tobacco abuse: Stop smoking Follow the recommendations we gave you for local resources for smoking cessation help  We will see you back in 4-6 months or sooner if needed    Current Outpatient Prescriptions:  .  albuterol (PROVENTIL HFA;VENTOLIN HFA) 108 (90 Base) MCG/ACT inhaler, Inhale 2 puffs into the lungs every 6 (six) hours as needed for wheezing., Disp: 3 Inhaler, Rfl: 3 .  albuterol (PROVENTIL) (2.5 MG/3ML) 0.083% nebulizer solution, Take 3 mLs (2.5 mg total) by nebulization every 4 (four) hours as needed., Disp:  120 mL, Rfl: 2 .  budesonide-formoterol (SYMBICORT) 160-4.5 MCG/ACT inhaler, INHALE 2 PUFFS BY MOUTH FIRST THING IN THE MORNING AND THEN ANOTHER 2PUFFS ABOUT 12 HOURS LATER, Disp: 10.2 g, Rfl: 0 .  clonazePAM (KLONOPIN) 0.5 MG tablet, Take 0.25 mg by mouth 3 (three) times daily as needed for anxiety., Disp: , Rfl:  .  clotrimazole (MYCELEX) 10 MG troche, Take 1 lozenge (10 mg total) by mouth 4 (four) times daily as needed., Disp: 35 lozenge, Rfl: 0 .  dextromethorphan-guaiFENesin (MUCINEX DM) 30-600 MG per 12 hr tablet, Take 1-2 tablets by mouth every 12 (twelve) hours. With water For congestion and cough , Disp: , Rfl:  .  fish oil-omega-3 fatty acids 1000 MG capsule, Take 1 g by mouth  daily.  , Disp: , Rfl:  .  losartan-hydrochlorothiazide (HYZAAR) 50-12.5 MG per tablet, Take 1 tablet by mouth daily., Disp: 30 tablet, Rfl: 5 .  mometasone (NASONEX) 50 MCG/ACT nasal spray, Place 2 sprays into the nose daily., Disp: 17 g, Rfl: 11 .  montelukast (SINGULAIR) 10 MG tablet, Take 1 tablet (10 mg total) by mouth at bedtime. NEED OFFICE VISIT FOR REFILLS, Disp: 30 tablet, Rfl: 0 .  PARoxetine (PAXIL) 20 MG tablet, Take 20 mg by mouth daily., Disp: , Rfl:  .  predniSONE (DELTASONE) 10 MG tablet, 40mg  daily for three days, 30mg  daily for three days, 20mg  daily for three days, then 10mg  daily for three days, Disp: 30 tablet, Rfl: 0 .  predniSONE (DELTASONE) 5 MG tablet, TAKE 1 TABLET BY MOUTH DAILY BEFORE BREAKFAST, Disp: 90 tablet, Rfl: 0 .  Respiratory Therapy Supplies (FLUTTER) DEVI, Use as directed., Disp: 1 each, Rfl: 0 .  sennosides (SENOKOT) 8.8 MG/5ML syrup, Take 5 mLs by mouth 2 (two) times daily., Disp: 240 mL, Rfl: 0 .  Spacer/Aero-Holding Chambers (AEROCHAMBER MV) inhaler, Use as instructed, Disp: 1 each, Rfl: 0 .  tiotropium (SPIRIVA HANDIHALER) 18 MCG inhalation capsule, Place 1 capsule (18 mcg total) into inhaler and inhale daily., Disp: 30 capsule, Rfl: 0 .  traZODone (DESYREL) 50 MG tablet, Take 50 mg by mouth at bedtime as needed for sleep., Disp: , Rfl:

## 2017-02-02 ENCOUNTER — Telehealth: Payer: Self-pay | Admitting: Pulmonary Disease

## 2017-02-02 NOTE — Telephone Encounter (Signed)
Spoke with pt, advised her that we had samples of the Symbicort but not Spiriva. She states she will come by and pick up the Symbicort. Nothing further is needed.

## 2017-02-04 ENCOUNTER — Encounter (HOSPITAL_COMMUNITY): Payer: Self-pay

## 2017-02-23 DIAGNOSIS — F418 Other specified anxiety disorders: Secondary | ICD-10-CM | POA: Diagnosis not present

## 2017-06-13 ENCOUNTER — Other Ambulatory Visit: Payer: Self-pay | Admitting: Pulmonary Disease

## 2017-07-30 DIAGNOSIS — L814 Other melanin hyperpigmentation: Secondary | ICD-10-CM | POA: Diagnosis not present

## 2017-07-30 DIAGNOSIS — L739 Follicular disorder, unspecified: Secondary | ICD-10-CM | POA: Diagnosis not present

## 2017-07-30 DIAGNOSIS — D225 Melanocytic nevi of trunk: Secondary | ICD-10-CM | POA: Diagnosis not present

## 2017-07-30 DIAGNOSIS — L821 Other seborrheic keratosis: Secondary | ICD-10-CM | POA: Diagnosis not present

## 2017-07-30 DIAGNOSIS — L720 Epidermal cyst: Secondary | ICD-10-CM | POA: Diagnosis not present

## 2017-08-12 ENCOUNTER — Other Ambulatory Visit: Payer: Self-pay | Admitting: Pulmonary Disease

## 2017-08-18 DIAGNOSIS — R208 Other disturbances of skin sensation: Secondary | ICD-10-CM | POA: Diagnosis not present

## 2017-08-18 DIAGNOSIS — L723 Sebaceous cyst: Secondary | ICD-10-CM | POA: Diagnosis not present

## 2017-10-01 ENCOUNTER — Other Ambulatory Visit: Payer: Self-pay | Admitting: Pulmonary Disease

## 2017-10-12 ENCOUNTER — Ambulatory Visit: Payer: PPO | Admitting: Nurse Practitioner

## 2017-10-12 ENCOUNTER — Encounter: Payer: Self-pay | Admitting: Nurse Practitioner

## 2017-10-12 VITALS — BP 132/84 | HR 93 | Ht 63.5 in | Wt 137.0 lb

## 2017-10-12 DIAGNOSIS — J309 Allergic rhinitis, unspecified: Secondary | ICD-10-CM

## 2017-10-12 DIAGNOSIS — J449 Chronic obstructive pulmonary disease, unspecified: Secondary | ICD-10-CM | POA: Diagnosis not present

## 2017-10-12 MED ORDER — MONTELUKAST SODIUM 10 MG PO TABS: 10.0000 mg | ORAL_TABLET | Freq: Every day | ORAL | 0 refills | Status: DC

## 2017-10-12 MED ORDER — ALBUTEROL SULFATE HFA 108 (90 BASE) MCG/ACT IN AERS: 2.0000 | INHALATION_SPRAY | Freq: Four times a day (QID) | RESPIRATORY_TRACT | 3 refills | Status: AC | PRN

## 2017-10-12 MED ORDER — TIOTROPIUM BROMIDE MONOHYDRATE 18 MCG IN CAPS: 18.0000 ug | ORAL_CAPSULE | Freq: Every day | RESPIRATORY_TRACT | 0 refills | Status: AC

## 2017-10-12 MED ORDER — ALBUTEROL SULFATE (2.5 MG/3ML) 0.083% IN NEBU: 2.5000 mg | INHALATION_SOLUTION | RESPIRATORY_TRACT | 2 refills | Status: AC | PRN

## 2017-10-12 NOTE — Assessment & Plan Note (Signed)
Quit smoking Take Symbicort 2 puffs twice a day no matter how you feel Take Spiriva 2 puffs daily no matter how you feel Use albuterol as needed for chest tightness wheezing or shortness of breath  Tobacco abuse: Stop smoking Follow the recommendations we gave you for local resources for smoking cessation help  Samples given today for symbicort and mucinex DM

## 2017-10-12 NOTE — Patient Instructions (Signed)
Quit smoking Take Symbicort 2 puffs twice a day no matter how you feel Take Spiriva 2 puffs daily no matter how you feel Use albuterol as needed for chest tightness wheezing or shortness of breath  Tobacco abuse: Stop smoking Follow the recommendations we gave you for local resources for smoking cessation help  Samples given today for symbicort and mucinex DM

## 2017-10-12 NOTE — Progress Notes (Signed)
@Patient  ID: Jenny Branch, female    DOB: [birth year], 62 y.o.   MRN: [id number]  Chief Complaint  Patient presents with  . Follow-up    COPD has gotten a little worse. Needs refills of medications and sample if available.    Referring provider: Ronnell Guadalajara*  62 year old patient current smoker with COPD GOLD Grade D - followed by Dr. Lake Bells.   HPI  Patient presents today for a routine follow up and medication refills. She has been doing well since last visit. She has been taking symbicort, spiriva, ventolin and albuterol nebulizer as directed. She admits that she is still smoking. She has not needed  Treatment for COPD exacerbation since last visit.    Recent [facility] Encounters:  OV PLAN 12-25-16 COPD: Quit smoking Take Symbicort 2 puffs twice a day no matter how you feel Take Spiriva 2 puffs daily no matter how you feel Use albuterol as needed for chest tightness wheezing or shortness of breath  Tobacco abuse: Stop smoking Follow the recommendations we gave you for local resources for smoking cessation help  We will see you back in 4-6 months or sooner if needed    Tests:   07-29-11 spirometry - ratio 54%, FEV1 1.03 L(41% PREDICTED)      Allergies  Allergen Reactions  . Penicillins Itching and Rash    Immunization History  Administered Date(s) Administered  . Influenza Split 12/28/2012, 12/18/2016  . Influenza,inj,Quad PF,6+ Mos 11/02/2013, 12/28/2014, 11/27/2015  . Pneumococcal Conjugate-13 01/02/2014  . Pneumococcal Polysaccharide-23 12/19/2013    Past Medical History:  Diagnosis Date  . Asthma   . COPD (chronic obstructive pulmonary disease) ([place])   . Depression   . Hypertension   . PTSD (post-traumatic stress disorder)     Tobacco History: Social History   Tobacco Use  Smoking Status Current Every Day Smoker  . Packs/day: 1.50  . Years: 33.00  . Pack years: 49.50  . Types: Cigarettes  Smokeless Tobacco Never Used    Tobacco Comment   Smoking 25 cigs daily 11/01/15 alc   Ready to quit: Not Answered Counseling given: Not Answered Comment: Smoking 25 cigs daily 11/01/15 alc   Outpatient Encounter Medications as of 10/12/2017  Medication Sig  . albuterol (PROVENTIL HFA;VENTOLIN HFA) 108 (90 Base) MCG/ACT inhaler Inhale 2 puffs into the lungs every 6 (six) hours as needed for wheezing.  Marland Kitchen albuterol (PROVENTIL) (2.5 MG/3ML) 0.083% nebulizer solution Take 3 mLs (2.5 mg total) by nebulization every 4 (four) hours as needed.  . clonazePAM (KLONOPIN) 0.5 MG tablet Take 0.25 mg by mouth 3 (three) times daily as needed for anxiety.  . clotrimazole (MYCELEX) 10 MG troche Take 1 lozenge (10 mg total) by mouth 4 (four) times daily as needed.  Marland Kitchen dextromethorphan-guaiFENesin (MUCINEX DM) 30-600 MG per 12 hr tablet Take 1-2 tablets by mouth every 12 (twelve) hours. With water For congestion and cough   . fish oil-omega-3 fatty acids 1000 MG capsule Take 1 g by mouth daily.    Marland Kitchen losartan-hydrochlorothiazide (HYZAAR) 50-12.5 MG per tablet Take 1 tablet by mouth daily.  . mometasone (NASONEX) 50 MCG/ACT nasal spray Place 2 sprays into the nose daily.  . montelukast (SINGULAIR) 10 MG tablet Take 1 tablet (10 mg total) by mouth at bedtime.  Marland Kitchen PARoxetine (PAXIL) 20 MG tablet Take 20 mg by mouth daily.  . predniSONE (DELTASONE) 5 MG tablet TAKE 1 TABLET BY MOUTH DAILY BEFORE BREAKFAST  . Respiratory Therapy Supplies (FLUTTER) DEVI Use  as directed.  . sennosides (SENOKOT) 8.8 MG/5ML syrup Take 5 mLs by mouth 2 (two) times daily.  Marland Kitchen Spacer/Aero-Holding Chambers (AEROCHAMBER MV) inhaler Use as instructed  . SYMBICORT 160-4.5 MCG/ACT inhaler INHALE 2 PUFFS BY MOUTH THE FIRST THING IN THER MORNING AND THEN ANOTHER 2 PUFFSABOUT 12 HOURS LATER  . tiotropium (SPIRIVA) 18 MCG inhalation capsule Place 1 capsule (18 mcg total) into inhaler and inhale daily.  . traZODone (DESYREL) 50 MG tablet Take 50 mg by mouth at bedtime as needed for  sleep.  . [DISCONTINUED] albuterol (PROVENTIL HFA;VENTOLIN HFA) 108 (90 Base) MCG/ACT inhaler Inhale 2 puffs into the lungs every 6 (six) hours as needed for wheezing.  . [DISCONTINUED] albuterol (PROVENTIL) (2.5 MG/3ML) 0.083% nebulizer solution Take 3 mLs (2.5 mg total) by nebulization every 4 (four) hours as needed.  . [DISCONTINUED] montelukast (SINGULAIR) 10 MG tablet Take 1 tablet (10 mg total) by mouth at bedtime. NEED OFFICE VISIT FOR REFILLS  . [DISCONTINUED] SPIRIVA HANDIHALER 18 MCG inhalation capsule INHALE CONTENTS OF 1 CAPSULE ONCE DAILY  . [DISCONTINUED] tiotropium (SPIRIVA) 18 MCG inhalation capsule Place 1 capsule (18 mcg total) into inhaler and inhale daily.  . [DISCONTINUED] budesonide-formoterol (SYMBICORT) 160-4.5 MCG/ACT inhaler Inhale 2 puffs into the lungs 2 (two) times daily. (Patient not taking: Reported on 10/12/2017)  . [DISCONTINUED] predniSONE (DELTASONE) 10 MG tablet 40mg  daily for three days, 30mg  daily for three days, 20mg  daily for three days, then 10mg  daily for three days (Patient not taking: Reported on 10/12/2017)   No facility-administered encounter medications on file as of 10/12/2017.      Review of Systems  Review of Systems  Constitutional: Negative.   HENT: Negative.   Respiratory: Positive for shortness of breath. Negative for cough.   Cardiovascular: Negative.   Gastrointestinal: Negative.   Allergic/Immunologic: Negative.   Neurological: Negative.   Psychiatric/Behavioral: Negative.        Physical Exam  BP 132/84 (BP Location: Left Arm, Patient Position: Sitting, Cuff Size: Normal)   Pulse 93   Ht 5' 3.5" (1.613 m)   Wt 137 lb (62.1 kg)   SpO2 91%   BMI 23.89 kg/m   Wt Readings from Last 5 Encounters:  10/12/17 137 lb (62.1 kg)  12/25/16 146 lb 12.8 oz (66.6 kg)  01/07/16 175 lb (79.4 kg)  11/27/15 179 lb (81.2 kg)  11/13/15 185 lb 9.6 oz (84.2 kg)     Physical Exam  Constitutional: She is oriented to person, place, and  time. She appears well-developed and well-nourished. No distress.  Cardiovascular: Normal rate and regular rhythm.  Pulmonary/Chest: Effort normal and breath sounds normal.  Neurological: She is alert and oriented to person, place, and time.  Psychiatric: She has a normal mood and affect.  Nursing note and vitals reviewed.    Lab Results:  CBC    Component Value Date/Time   WBC 12.1 (H) 05/24/2015 0530   RBC 3.42 (L) 05/24/2015 0530   HGB 11.9 (L) 05/24/2015 0530   HCT 34.4 (L) 05/24/2015 0530   PLT 314 05/24/2015 0530   MCV 100.6 (H) 05/24/2015 0530   MCH 34.8 (H) 05/24/2015 0530   MCHC 34.6 05/24/2015 0530   RDW 14.3 05/24/2015 0530   LYMPHSABS 1.3 05/24/2015 0530   MONOABS 1.2 (H) 05/24/2015 0530   EOSABS 0.1 05/24/2015 0530   BASOSABS 0.0 05/24/2015 0530    BMET    Component Value Date/Time   NA 139 05/25/2015 0525   K 3.8 05/25/2015 0525   CL  102 05/25/2015 0525   CO2 31 05/25/2015 0525   GLUCOSE 90 05/25/2015 0525   BUN 9 05/25/2015 0525   CREATININE 0.56 05/25/2015 0525   CALCIUM 8.5 (L) 05/25/2015 0525   GFRNONAA >60 05/25/2015 0525   GFRAA >60 05/25/2015 0525    BNP No results found for: BNP  ProBNP No results found for: PROBNP  Imaging: No results found.   Assessment & Plan:   COPD GOLD III  Quit smoking Take Symbicort 2 puffs twice a day no matter how you feel Take Spiriva 2 puffs daily no matter how you feel Use albuterol as needed for chest tightness wheezing or shortness of breath  Tobacco abuse: Stop smoking Follow the recommendations we gave you for local resources for smoking cessation help  Samples given today for symbicort and mucinex DM  Allergic rhinitis Continue current medications     Fenton Foy, NP 10/12/2017

## 2017-10-12 NOTE — Assessment & Plan Note (Signed)
Continue current medications. 

## 2017-10-20 NOTE — Progress Notes (Signed)
Reviewed, agree 

## 2017-11-09 ENCOUNTER — Other Ambulatory Visit: Payer: Self-pay | Admitting: Pulmonary Disease

## 2017-11-09 ENCOUNTER — Other Ambulatory Visit: Payer: Self-pay | Admitting: Nurse Practitioner

## 2017-11-11 DIAGNOSIS — H2512 Age-related nuclear cataract, left eye: Secondary | ICD-10-CM | POA: Diagnosis not present

## 2017-11-11 DIAGNOSIS — H524 Presbyopia: Secondary | ICD-10-CM | POA: Diagnosis not present

## 2017-11-23 ENCOUNTER — Inpatient Hospital Stay (HOSPITAL_COMMUNITY): Payer: PPO

## 2017-11-23 ENCOUNTER — Emergency Department (HOSPITAL_COMMUNITY): Payer: PPO

## 2017-11-23 ENCOUNTER — Encounter (HOSPITAL_COMMUNITY): Payer: Self-pay | Admitting: *Deleted

## 2017-11-23 ENCOUNTER — Inpatient Hospital Stay (HOSPITAL_COMMUNITY)
Admission: EM | Admit: 2017-11-23 | Discharge: 2017-12-01 | DRG: 871 | Disposition: E | Payer: PPO | Attending: Pulmonary Disease | Admitting: Pulmonary Disease

## 2017-11-23 DIAGNOSIS — I480 Paroxysmal atrial fibrillation: Secondary | ICD-10-CM | POA: Diagnosis present

## 2017-11-23 DIAGNOSIS — R0602 Shortness of breath: Secondary | ICD-10-CM | POA: Diagnosis not present

## 2017-11-23 DIAGNOSIS — J449 Chronic obstructive pulmonary disease, unspecified: Secondary | ICD-10-CM | POA: Diagnosis not present

## 2017-11-23 DIAGNOSIS — J9621 Acute and chronic respiratory failure with hypoxia: Secondary | ICD-10-CM | POA: Diagnosis not present

## 2017-11-23 DIAGNOSIS — E162 Hypoglycemia, unspecified: Secondary | ICD-10-CM | POA: Diagnosis not present

## 2017-11-23 DIAGNOSIS — Z79891 Long term (current) use of opiate analgesic: Secondary | ICD-10-CM | POA: Diagnosis not present

## 2017-11-23 DIAGNOSIS — R0902 Hypoxemia: Secondary | ICD-10-CM | POA: Diagnosis not present

## 2017-11-23 DIAGNOSIS — Z880 Allergy status to penicillin: Secondary | ICD-10-CM

## 2017-11-23 DIAGNOSIS — J9601 Acute respiratory failure with hypoxia: Secondary | ICD-10-CM

## 2017-11-23 DIAGNOSIS — R0603 Acute respiratory distress: Secondary | ICD-10-CM

## 2017-11-23 DIAGNOSIS — G9341 Metabolic encephalopathy: Secondary | ICD-10-CM | POA: Diagnosis not present

## 2017-11-23 DIAGNOSIS — J9602 Acute respiratory failure with hypercapnia: Secondary | ICD-10-CM

## 2017-11-23 DIAGNOSIS — A419 Sepsis, unspecified organism: Principal | ICD-10-CM | POA: Diagnosis present

## 2017-11-23 DIAGNOSIS — I5031 Acute diastolic (congestive) heart failure: Secondary | ICD-10-CM | POA: Diagnosis present

## 2017-11-23 DIAGNOSIS — E871 Hypo-osmolality and hyponatremia: Secondary | ICD-10-CM | POA: Diagnosis not present

## 2017-11-23 DIAGNOSIS — Z7951 Long term (current) use of inhaled steroids: Secondary | ICD-10-CM

## 2017-11-23 DIAGNOSIS — Z66 Do not resuscitate: Secondary | ICD-10-CM | POA: Diagnosis present

## 2017-11-23 DIAGNOSIS — I110 Hypertensive heart disease with heart failure: Secondary | ICD-10-CM | POA: Diagnosis present

## 2017-11-23 DIAGNOSIS — Z9911 Dependence on respirator [ventilator] status: Secondary | ICD-10-CM

## 2017-11-23 DIAGNOSIS — Z0189 Encounter for other specified special examinations: Secondary | ICD-10-CM | POA: Diagnosis not present

## 2017-11-23 DIAGNOSIS — R000 Tachycardia, unspecified: Secondary | ICD-10-CM | POA: Diagnosis not present

## 2017-11-23 DIAGNOSIS — J9600 Acute respiratory failure, unspecified whether with hypoxia or hypercapnia: Secondary | ICD-10-CM

## 2017-11-23 DIAGNOSIS — N179 Acute kidney failure, unspecified: Secondary | ICD-10-CM | POA: Diagnosis not present

## 2017-11-23 DIAGNOSIS — F329 Major depressive disorder, single episode, unspecified: Secondary | ICD-10-CM | POA: Diagnosis present

## 2017-11-23 DIAGNOSIS — Z452 Encounter for adjustment and management of vascular access device: Secondary | ICD-10-CM | POA: Diagnosis not present

## 2017-11-23 DIAGNOSIS — J984 Other disorders of lung: Secondary | ICD-10-CM | POA: Diagnosis not present

## 2017-11-23 DIAGNOSIS — R6521 Severe sepsis with septic shock: Secondary | ICD-10-CM | POA: Diagnosis present

## 2017-11-23 DIAGNOSIS — F17210 Nicotine dependence, cigarettes, uncomplicated: Secondary | ICD-10-CM | POA: Diagnosis present

## 2017-11-23 DIAGNOSIS — J181 Lobar pneumonia, unspecified organism: Secondary | ICD-10-CM

## 2017-11-23 DIAGNOSIS — J13 Pneumonia due to Streptococcus pneumoniae: Secondary | ICD-10-CM | POA: Diagnosis present

## 2017-11-23 DIAGNOSIS — J189 Pneumonia, unspecified organism: Secondary | ICD-10-CM | POA: Diagnosis not present

## 2017-11-23 DIAGNOSIS — R404 Transient alteration of awareness: Secondary | ICD-10-CM | POA: Diagnosis not present

## 2017-11-23 DIAGNOSIS — R0600 Dyspnea, unspecified: Secondary | ICD-10-CM | POA: Diagnosis not present

## 2017-11-23 DIAGNOSIS — G9340 Encephalopathy, unspecified: Secondary | ICD-10-CM | POA: Diagnosis not present

## 2017-11-23 DIAGNOSIS — Z7189 Other specified counseling: Secondary | ICD-10-CM | POA: Diagnosis not present

## 2017-11-23 DIAGNOSIS — R579 Shock, unspecified: Secondary | ICD-10-CM | POA: Diagnosis not present

## 2017-11-23 DIAGNOSIS — Z9289 Personal history of other medical treatment: Secondary | ICD-10-CM

## 2017-11-23 DIAGNOSIS — D696 Thrombocytopenia, unspecified: Secondary | ICD-10-CM | POA: Diagnosis present

## 2017-11-23 DIAGNOSIS — I248 Other forms of acute ischemic heart disease: Secondary | ICD-10-CM | POA: Diagnosis present

## 2017-11-23 DIAGNOSIS — J440 Chronic obstructive pulmonary disease with acute lower respiratory infection: Secondary | ICD-10-CM | POA: Diagnosis present

## 2017-11-23 DIAGNOSIS — F4310 Post-traumatic stress disorder, unspecified: Secondary | ICD-10-CM | POA: Diagnosis not present

## 2017-11-23 DIAGNOSIS — J80 Acute respiratory distress syndrome: Secondary | ICD-10-CM | POA: Diagnosis not present

## 2017-11-23 DIAGNOSIS — Z515 Encounter for palliative care: Secondary | ICD-10-CM | POA: Diagnosis not present

## 2017-11-23 DIAGNOSIS — Z4682 Encounter for fitting and adjustment of non-vascular catheter: Secondary | ICD-10-CM | POA: Diagnosis not present

## 2017-11-23 DIAGNOSIS — J9622 Acute and chronic respiratory failure with hypercapnia: Secondary | ICD-10-CM | POA: Diagnosis not present

## 2017-11-23 DIAGNOSIS — R918 Other nonspecific abnormal finding of lung field: Secondary | ICD-10-CM | POA: Diagnosis not present

## 2017-11-23 DIAGNOSIS — R0689 Other abnormalities of breathing: Secondary | ICD-10-CM | POA: Diagnosis not present

## 2017-11-23 LAB — BASIC METABOLIC PANEL
ANION GAP: 21 — AB (ref 5–15)
Anion gap: 21 — ABNORMAL HIGH (ref 5–15)
BUN: 33 mg/dL — ABNORMAL HIGH (ref 6–20)
BUN: 36 mg/dL — ABNORMAL HIGH (ref 6–20)
CALCIUM: 7.6 mg/dL — AB (ref 8.9–10.3)
CALCIUM: 7.5 mg/dL — AB (ref 8.9–10.3)
CO2: 30 mmol/L (ref 22–32)
CO2: 25 mmol/L (ref 22–32)
CREATININE: 2.72 mg/dL — AB (ref 0.44–1.00)
Chloride: 76 mmol/L — ABNORMAL LOW (ref 98–111)
Chloride: 83 mmol/L — ABNORMAL LOW (ref 98–111)
Creatinine, Ser: 2.89 mg/dL — ABNORMAL HIGH (ref 0.44–1.00)
GFR calc Af Amer: 21 mL/min — ABNORMAL LOW (ref >60)
GFR calc non Af Amer: 18 mL/min — ABNORMAL LOW (ref >60)
GFR, EST AFRICAN AMERICAN: 19 mL/min — AB (ref >60)
GFR, EST NON AFRICAN AMERICAN: 17 mL/min — AB (ref >60)
GLUCOSE: 58 mg/dL — AB (ref 70–99)
Glucose, Bld: 66 mg/dL — ABNORMAL LOW (ref 70–99)
POTASSIUM: 3.2 mmol/L — AB (ref 3.5–5.1)
Potassium: 3.5 mmol/L (ref 3.5–5.1)
Sodium: 127 mmol/L — ABNORMAL LOW (ref 135–145)
Sodium: 129 mmol/L — ABNORMAL LOW (ref 135–145)

## 2017-11-23 LAB — CBC WITH DIFFERENTIAL/PLATELET
BASOS ABS: 0 10*3/uL (ref 0.0–0.1)
BASOS PCT: 0 %
Eosinophils Absolute: 0 10*3/uL (ref 0.0–0.7)
Eosinophils Relative: 0 %
HEMATOCRIT: 48 % — AB (ref 36.0–46.0)
HEMOGLOBIN: 16.4 g/dL — AB (ref 12.0–15.0)
LYMPHS PCT: 2 %
Lymphs Abs: 0.8 10*3/uL (ref 0.7–4.0)
MCH: 37.4 pg — ABNORMAL HIGH (ref 26.0–34.0)
MCHC: 34.2 g/dL (ref 30.0–36.0)
MCV: 109.3 fL — ABNORMAL HIGH (ref 78.0–100.0)
MONOS PCT: 2 %
Monocytes Absolute: 0.8 10*3/uL (ref 0.1–1.0)
NEUTROS ABS: 38.2 10*3/uL — AB (ref 1.7–7.7)
Neutrophils Relative %: 96 %
Platelets: 293 10*3/uL (ref 150–400)
RBC: 4.39 MIL/uL (ref 3.87–5.11)
RDW: 16.8 % — ABNORMAL HIGH (ref 11.5–15.5)
WBC Morphology: INCREASED
WBC: 39.8 10*3/uL — ABNORMAL HIGH (ref 4.0–10.5)

## 2017-11-23 LAB — SODIUM, URINE, RANDOM: SODIUM UR: 22 mmol/L

## 2017-11-23 LAB — URINALYSIS, ROUTINE W REFLEX MICROSCOPIC
GLUCOSE, UA: NEGATIVE mg/dL
KETONES UR: NEGATIVE mg/dL
Nitrite: NEGATIVE
Protein, ur: 100 mg/dL — AB
Specific Gravity, Urine: 1.017 (ref 1.005–1.030)
WBC, UA: >50 WBC/hpf — ABNORMAL HIGH (ref 0–5)
pH: 5 (ref 5.0–8.0)

## 2017-11-23 LAB — COMPREHENSIVE METABOLIC PANEL
ALT: 32 U/L (ref 0–44)
ANION GAP: 28 — AB (ref 5–15)
AST: 95 U/L — ABNORMAL HIGH (ref 15–41)
Albumin: 2.7 g/dL — ABNORMAL LOW (ref 3.5–5.0)
Alkaline Phosphatase: 136 U/L — ABNORMAL HIGH (ref 38–126)
BUN: 35 mg/dL — ABNORMAL HIGH (ref 6–20)
CHLORIDE: 75 mmol/L — AB (ref 98–111)
CO2: 24 mmol/L (ref 22–32)
Calcium: 8.7 mg/dL — ABNORMAL LOW (ref 8.9–10.3)
Creatinine, Ser: 3.08 mg/dL — ABNORMAL HIGH (ref 0.44–1.00)
GFR calc non Af Amer: 16 mL/min — ABNORMAL LOW (ref >60)
GFR, EST AFRICAN AMERICAN: 18 mL/min — AB (ref >60)
Glucose, Bld: 41 mg/dL — CL (ref 70–99)
POTASSIUM: 4.2 mmol/L (ref 3.5–5.1)
SODIUM: 127 mmol/L — AB (ref 135–145)
Total Bilirubin: 2.4 mg/dL — ABNORMAL HIGH (ref 0.3–1.2)
Total Protein: 6.2 g/dL — ABNORMAL LOW (ref 6.5–8.1)

## 2017-11-23 LAB — TROPONIN I
TROPONIN I: 1.03 ng/mL — AB (ref <0.03)
Troponin I: 2.03 ng/mL (ref <0.03)
Troponin I: 3.32 ng/mL (ref <0.03)
Troponin I: 3.69 ng/mL (ref <0.03)

## 2017-11-23 LAB — GLUCOSE, CAPILLARY
GLUCOSE-CAPILLARY: 66 mg/dL — AB (ref 70–99)
GLUCOSE-CAPILLARY: 86 mg/dL (ref 70–99)
Glucose-Capillary: 105 mg/dL — ABNORMAL HIGH (ref 70–99)
Glucose-Capillary: 83 mg/dL (ref 70–99)
Glucose-Capillary: 76 mg/dL (ref 70–99)

## 2017-11-23 LAB — POCT I-STAT 3, ART BLOOD GAS (G3+)
Acid-Base Excess: 4 mmol/L — ABNORMAL HIGH (ref 0.0–2.0)
BICARBONATE: 31.4 mmol/L — AB (ref 20.0–28.0)
O2 Saturation: 98 %
PCO2 ART: 56 mmHg — AB (ref 32.0–48.0)
TCO2: 33 mmol/L — ABNORMAL HIGH (ref 22–32)
pH, Arterial: 7.354 (ref 7.350–7.450)
pO2, Arterial: 115 mmHg — ABNORMAL HIGH (ref 83.0–108.0)

## 2017-11-23 LAB — I-STAT CHEM 8, ED
BUN: 43 mg/dL — AB (ref 6–20)
CALCIUM ION: 0.89 mmol/L — AB (ref 1.15–1.40)
CREATININE: 3 mg/dL — AB (ref 0.44–1.00)
Chloride: 80 mmol/L — ABNORMAL LOW (ref 98–111)
GLUCOSE: 46 mg/dL — AB (ref 70–99)
HCT: 56 % — ABNORMAL HIGH (ref 36.0–46.0)
Hemoglobin: 19 g/dL — ABNORMAL HIGH (ref 12.0–15.0)
Potassium: 3.9 mmol/L (ref 3.5–5.1)
Sodium: 121 mmol/L — ABNORMAL LOW (ref 135–145)
TCO2: 29 mmol/L (ref 22–32)

## 2017-11-23 LAB — I-STAT TROPONIN, ED: Troponin i, poc: 1.12 ng/mL (ref 0.00–0.08)

## 2017-11-23 LAB — ETHANOL

## 2017-11-23 LAB — I-STAT ARTERIAL BLOOD GAS, ED
Acid-base deficit: 2 mmol/L (ref 0.0–2.0)
Bicarbonate: 27.8 mmol/L (ref 20.0–28.0)
O2 Saturation: 98 %
PCO2 ART: 63 mmHg — AB (ref 32.0–48.0)
PH ART: 7.253 — AB (ref 7.350–7.450)
TCO2: 30 mmol/L (ref 22–32)
pO2, Arterial: 120 mmHg — ABNORMAL HIGH (ref 83.0–108.0)

## 2017-11-23 LAB — I-STAT CG4 LACTIC ACID, ED: LACTIC ACID, VENOUS: 10.41 mmol/L — AB (ref 0.5–1.9)

## 2017-11-23 LAB — PROCALCITONIN: Procalcitonin: 11.85 ng/mL

## 2017-11-23 LAB — PROTIME-INR
INR: 1.6
Prothrombin Time: 18.9 seconds — ABNORMAL HIGH (ref 11.4–15.2)

## 2017-11-23 LAB — STREP PNEUMONIAE URINARY ANTIGEN: STREP PNEUMO URINARY ANTIGEN: POSITIVE — AB

## 2017-11-23 LAB — CBG MONITORING, ED: Glucose-Capillary: 187 mg/dL — ABNORMAL HIGH (ref 70–99)

## 2017-11-23 LAB — LACTIC ACID, PLASMA: Lactic Acid, Venous: 4.8 mmol/L (ref 0.5–1.9)

## 2017-11-23 LAB — RAPID URINE DRUG SCREEN, HOSP PERFORMED
AMPHETAMINES: POSITIVE — AB
Barbiturates: POSITIVE — AB
Benzodiazepines: NOT DETECTED
Cocaine: NOT DETECTED
Opiates: NOT DETECTED
Tetrahydrocannabinol: NOT DETECTED

## 2017-11-23 LAB — BRAIN NATRIURETIC PEPTIDE: B Natriuretic Peptide: 2852.4 pg/mL — ABNORMAL HIGH (ref 0.0–100.0)

## 2017-11-23 LAB — ECHOCARDIOGRAM COMPLETE
HEIGHTINCHES: 63 in
Weight: 2338.64 oz

## 2017-11-23 LAB — TSH: TSH: 5.159 u[IU]/mL — ABNORMAL HIGH (ref 0.350–4.500)

## 2017-11-23 LAB — MRSA PCR SCREENING: MRSA by PCR: NEGATIVE

## 2017-11-23 LAB — OSMOLALITY: Osmolality: 275 mOsm/kg (ref 275–295)

## 2017-11-23 LAB — MAGNESIUM: MAGNESIUM: 1.1 mg/dL — AB (ref 1.7–2.4)

## 2017-11-23 LAB — CORTISOL: Cortisol, Plasma: >100 ug/dL

## 2017-11-23 LAB — OSMOLALITY, URINE: Osmolality, Ur: 286 mOsm/kg — ABNORMAL LOW (ref 300–900)

## 2017-11-23 MED ORDER — IPRATROPIUM-ALBUTEROL 0.5-2.5 (3) MG/3ML IN SOLN
3.0000 mL | Freq: Four times a day (QID) | RESPIRATORY_TRACT | Status: DC
  Administered 2017-11-23 – 2017-11-28 (×20): 3 mL via RESPIRATORY_TRACT
  Filled 2017-11-23 (×21): qty 3

## 2017-11-23 MED ORDER — DEXTROSE-NACL 5-0.9 % IV SOLN
INTRAVENOUS | Status: DC
  Administered 2017-11-23 – 2017-11-25 (×3): via INTRAVENOUS

## 2017-11-23 MED ORDER — SODIUM CHLORIDE 0.9 % IV BOLUS
1500.0000 mL | Freq: Once | INTRAVENOUS | Status: AC
  Administered 2017-11-23: 1000 mL via INTRAVENOUS

## 2017-11-23 MED ORDER — ACETAMINOPHEN 650 MG RE SUPP
650.0000 mg | RECTAL | Status: DC | PRN
  Administered 2017-11-24: 650 mg via RECTAL
  Filled 2017-11-23: qty 1

## 2017-11-23 MED ORDER — HEPARIN BOLUS VIA INFUSION
2000.0000 [IU] | Freq: Once | INTRAVENOUS | Status: AC
  Administered 2017-11-23: 2000 [IU] via INTRAVENOUS
  Filled 2017-11-23: qty 2000

## 2017-11-23 MED ORDER — HEPARIN (PORCINE) IN NACL 100-0.45 UNIT/ML-% IJ SOLN
1250.0000 [IU]/h | INTRAMUSCULAR | Status: DC
  Administered 2017-11-23: 800 [IU]/h via INTRAVENOUS
  Administered 2017-11-24: 1100 [IU]/h via INTRAVENOUS
  Filled 2017-11-23 (×2): qty 250

## 2017-11-23 MED ORDER — ALBUTEROL SULFATE (2.5 MG/3ML) 0.083% IN NEBU: 2.5000 mg | INHALATION_SOLUTION | RESPIRATORY_TRACT | Status: DC | PRN

## 2017-11-23 MED ORDER — SODIUM CHLORIDE 0.9 % IV SOLN: INTRAVENOUS | Status: DC | PRN

## 2017-11-23 MED ORDER — FENTANYL CITRATE (PF) 100 MCG/2ML IJ SOLN
INTRAMUSCULAR | Status: AC
  Filled 2017-11-23: qty 4

## 2017-11-23 MED ORDER — FENTANYL CITRATE (PF) 100 MCG/2ML IJ SOLN
50.0000 ug | Freq: Once | INTRAMUSCULAR | Status: AC
  Administered 2017-11-23: 50 ug via INTRAVENOUS

## 2017-11-23 MED ORDER — PANTOPRAZOLE SODIUM 40 MG PO TBEC
40.0000 mg | DELAYED_RELEASE_TABLET | Freq: Every day | ORAL | Status: DC
  Administered 2017-11-23 – 2017-11-25 (×2): 40 mg via ORAL
  Filled 2017-11-23 (×2): qty 1

## 2017-11-23 MED ORDER — METOPROLOL TARTRATE 5 MG/5ML IV SOLN
2.5000 mg | Freq: Four times a day (QID) | INTRAVENOUS | Status: DC
  Administered 2017-11-24: 2.5 mg via INTRAVENOUS
  Filled 2017-11-23: qty 5

## 2017-11-23 MED ORDER — MIDAZOLAM HCL 2 MG/2ML IJ SOLN
INTRAMUSCULAR | Status: AC
  Filled 2017-11-23: qty 2

## 2017-11-23 MED ORDER — AMIODARONE HCL IN DEXTROSE 360-4.14 MG/200ML-% IV SOLN: 30.0000 mg/h | INTRAVENOUS | Status: DC

## 2017-11-23 MED ORDER — FENTANYL CITRATE (PF) 100 MCG/2ML IJ SOLN
INTRAMUSCULAR | Status: AC
  Filled 2017-11-23: qty 2

## 2017-11-23 MED ORDER — ASPIRIN 300 MG RE SUPP
150.0000 mg | Freq: Every day | RECTAL | Status: DC
  Filled 2017-11-23: qty 1

## 2017-11-23 MED ORDER — SODIUM CHLORIDE 0.9 % IV SOLN
1.0000 g | Freq: Once | INTRAVENOUS | Status: DC
  Filled 2017-11-23: qty 1

## 2017-11-23 MED ORDER — HEPARIN SODIUM (PORCINE) 5000 UNIT/ML IJ SOLN
5000.0000 [IU] | Freq: Three times a day (TID) | INTRAMUSCULAR | Status: DC
  Administered 2017-11-23: 5000 [IU] via SUBCUTANEOUS
  Filled 2017-11-23: qty 1

## 2017-11-23 MED ORDER — AMIODARONE HCL IN DEXTROSE 360-4.14 MG/200ML-% IV SOLN
60.0000 mg/h | INTRAVENOUS | Status: DC
  Filled 2017-11-23: qty 200

## 2017-11-23 MED ORDER — HEPARIN SODIUM (PORCINE) 5000 UNIT/ML IJ SOLN
INTRAMUSCULAR | Status: AC
  Filled 2017-11-23: qty 1

## 2017-11-23 MED ORDER — LEVOFLOXACIN IN D5W 750 MG/150ML IV SOLN
750.0000 mg | INTRAVENOUS | Status: DC
  Administered 2017-11-23: 750 mg via INTRAVENOUS
  Filled 2017-11-23: qty 150

## 2017-11-23 MED ORDER — MONTELUKAST SODIUM 10 MG PO TABS
10.0000 mg | ORAL_TABLET | Freq: Every day | ORAL | Status: DC
  Administered 2017-11-24: 10 mg via ORAL
  Filled 2017-11-23 (×2): qty 1

## 2017-11-23 MED ORDER — DEXTROSE 50 % IV SOLN
1.0000 | Freq: Once | INTRAVENOUS | Status: AC
  Administered 2017-11-23: 50 mL via INTRAVENOUS

## 2017-11-23 MED ORDER — SODIUM CHLORIDE 0.9 % IV SOLN
1.0000 g | Freq: Three times a day (TID) | INTRAVENOUS | Status: DC
  Administered 2017-11-23: 1 g via INTRAVENOUS
  Filled 2017-11-23 (×2): qty 1

## 2017-11-23 MED ORDER — SODIUM CHLORIDE 0.9 % IV SOLN
1.0000 g | Freq: Two times a day (BID) | INTRAVENOUS | Status: DC
  Administered 2017-11-23 – 2017-11-25 (×4): 1 g via INTRAVENOUS
  Filled 2017-11-23 (×5): qty 1

## 2017-11-23 MED ORDER — LEVOFLOXACIN IN D5W 750 MG/150ML IV SOLN: 750.0000 mg | INTRAVENOUS | Status: DC

## 2017-11-23 MED ORDER — DEXTROSE 50 % IV SOLN
INTRAVENOUS | Status: AC
  Filled 2017-11-23: qty 50

## 2017-11-23 MED ORDER — ACETAMINOPHEN 325 MG PO TABS
650.0000 mg | ORAL_TABLET | Freq: Four times a day (QID) | ORAL | Status: DC | PRN
  Administered 2017-11-23: 650 mg via ORAL
  Filled 2017-11-23 (×3): qty 2

## 2017-11-23 MED ORDER — VANCOMYCIN HCL IN DEXTROSE 750-5 MG/150ML-% IV SOLN: 750.0000 mg | INTRAVENOUS | Status: DC

## 2017-11-23 MED ORDER — POTASSIUM CHLORIDE 10 MEQ/100ML IV SOLN
10.0000 meq | INTRAVENOUS | Status: AC
  Administered 2017-11-23 – 2017-11-24 (×3): 10 meq via INTRAVENOUS
  Filled 2017-11-23 (×3): qty 100

## 2017-11-23 MED ORDER — SODIUM CHLORIDE 0.9 % IV SOLN
2.0000 g | INTRAVENOUS | Status: AC
  Administered 2017-11-23: 2 g via INTRAVENOUS
  Filled 2017-11-23: qty 20

## 2017-11-23 MED ORDER — SODIUM CHLORIDE 0.9 % IV SOLN: INTRAVENOUS | Status: DC

## 2017-11-23 MED ORDER — SODIUM CHLORIDE 0.9 % IV BOLUS
500.0000 mL | Freq: Once | INTRAVENOUS | Status: AC
  Administered 2017-11-23: 500 mL via INTRAVENOUS

## 2017-11-23 MED ORDER — POTASSIUM CHLORIDE CRYS ER 20 MEQ PO TBCR
20.0000 meq | EXTENDED_RELEASE_TABLET | Freq: Once | ORAL | Status: AC
  Administered 2017-11-23: 20 meq via ORAL
  Filled 2017-11-23: qty 1

## 2017-11-23 MED ORDER — VANCOMYCIN HCL 10 G IV SOLR
1500.0000 mg | Freq: Once | INTRAVENOUS | Status: AC
  Administered 2017-11-23: 1500 mg via INTRAVENOUS
  Filled 2017-11-23: qty 1500

## 2017-11-23 MED ORDER — AMIODARONE LOAD VIA INFUSION
150.0000 mg | Freq: Once | INTRAVENOUS | Status: DC
  Filled 2017-11-23: qty 83.34

## 2017-11-23 MED ORDER — PHENYLEPHRINE HCL-NACL 10-0.9 MG/250ML-% IV SOLN
0.0000 ug/min | INTRAVENOUS | Status: DC
  Administered 2017-11-23: 20 ug/min via INTRAVENOUS
  Filled 2017-11-23 (×2): qty 250

## 2017-11-23 MED ORDER — HYDROCORTISONE NA SUCCINATE PF 100 MG IJ SOLR
100.0000 mg | Freq: Once | INTRAMUSCULAR | Status: AC
  Administered 2017-11-23: 100 mg via INTRAVENOUS
  Filled 2017-11-23: qty 2

## 2017-11-23 NOTE — H&P (Signed)
NAME:  BOB EASTWOOD, MRN:  [id number], DOB:  09-07-1955, LOS: 0 ADMISSION DATE:  11/22/2017, CONSULTATION DATE:  11/23/2008 REFERRING MD:  Dr Angela Adam, ER CHIEF COMPLAINT:  Short of breath   Brief History   62 yo female smoker with altered mental status, hypoxia/hypercapnia, hypotension, hyponatremia, acute renal failure.  Found to have Lt upper lobe consolidation on CXR.  Followed by Dr. Lake Bells in pulmonary office for advanced COPD.  Past Medical History Hypertension, PTSD, Depression  Significant Hospital Events   9/23 Admit  Consults: date of consult/date signed off & final recs:   Procedures (surgical and bedside):   Significant Diagnostic Tests:    Micro Data: Blood 9/23 >> Sputum 9/23 >> HIV 9/23 >> Legionella 9/23 >> Pneumococcal 9/23 >>  Antimicrobials:  Levaquin 9/23 >> Vancomycin 9/23 >>    Subjective:  Pt not able to proved information due to altered mental status.  Objective   Blood pressure (!) 74/42, pulse (!) 101, resp. rate (!) 25, SpO2 100 %.    FiO2 (%):  [60 %] 60 %  No intake or output data in the 24 hours ending 11/06/2017 0844 There were no vitals filed for this visit.  Examination:  General - somnolent Eyes - pupils dilated and reactive ENT - Bipap mask on Cardiac - regular, tachycardic, no murmur Chest - b/l crackles Abdomen - soft, non tender, + bowel sounds, no hepatosplenomegaly GU - no lesions noted Extremities - 1+ edema Skin - multiple areas of ecchymosis Lymphatics - no lymphadenopathy Neuro - opens eyes with stimulation, follows simple commands, moves all extremities     Resolved Hospital Problem list    Assessment & Plan:  Sepsis from community acquired pneumonia. - she has hx of PCN allergy - vancomycin, levaquin - f/u cultures - continue IV fluids - check cortisol, procalcitonin  Lactic acidosis. - likely from sepsis and hypoxia - f/u lactic acid level  Acute on chronic hypoxic, hypercapnic respiratory  failure. COPD and asthma with continue tobacco abuse. - Bipap - f/u CXR, ABG - scheduled BDs - continue singulair - don't think she needs systemic steroids at this time  Acute renal failure from meds, volume depletion, sepsis. Hyponatremia. - check cortisol, TSH, urine osmo and Na, serum osmo - hold outpt hyzaar - NS IV fluid - f/u BMET  Elevated troponin suspect from demand ischemia. Hx of hypertension. - monitor on tele - f/u Echo, cardiac enzymes  Acute metabolic encephalopathy. Hx of depression. - monitor mental status - hold outpt klonopin, paxil, trazodone  Hypoglycemia. - dextrose in IV fluids - f/u blood sugar on BMET for now  Disposition / Summary of Today's Plan 11/18/2017   Admit to ICU    Diet: NPO, sips with meds DVT prophylaxis: SQ heparin GI prophylaxis: Not indicated Mobility: Bed rest Code Status: Full code Family Communication: No family at bedside  Labs   CBC: Recent Labs  Lab 11/22/2017 0721  HGB 19.0*  HCT 46.6*   Basic Metabolic Panel: Recent Labs  Lab 11/04/2017 0721  NA 121*  K 3.9  CL 80*  GLUCOSE 46*  BUN 43*  CREATININE 3.00*   GFR: CrCl cannot be calculated (Unknown ideal weight.). Recent Labs  Lab 11/01/2017 0722  LATICACIDVEN 10.41*   Liver Function Tests: No results for input(s): AST, ALT, ALKPHOS, BILITOT, PROT, ALBUMIN in the last 168 hours. No results for input(s): LIPASE, AMYLASE in the last 168 hours. No results for input(s): AMMONIA in the last 168 hours. ABG  Component Value Date/Time   PHART 7.253 (L) 11/03/2017 0716   PCO2ART 63.0 (H) 11/03/2017 0716   PO2ART 120.0 (H) 11/01/2017 0716   HCO3 27.8 11/16/2017 0716   TCO2 29 11/05/2017 0721   ACIDBASEDEF 2.0 11/15/2017 0716   O2SAT 98.0 11/19/2017 0716    Coagulation Profile: Recent Labs  Lab 11/22/2017 0710  INR 1.60   Cardiac Enzymes: No results for input(s): CKTOTAL, CKMB, CKMBINDEX, TROPONINI in the last 168 hours. HbA1C: No results found for:  HGBA1C CBG: No results for input(s): GLUCAP in the last 168 hours.  Admitting History of Present Illness.   62 yo female called EMS with shortness of breath.  Had cough, chest congestion, and lethargy.  Noted to be hypoxic.  Started on CPAP by EMS and then Bipap in ER.  Noted to have hypotension, acute renal failure, hyponatremia.  CXR showed lt upper lobe consolidation.  Started on ABx and IV fluids.  She is not able to provide much hx due to altered mental status.  Review of Systems:   Unable to obtain.  Surgical History   Tonsillectomy   Social History  She continues to smoke cigarettes  Family history   Her mother has RA.  Father has CHF and prostate cancer.  Sister has alcoholism.   Allergies Allergies  Allergen Reactions  . Penicillins Itching    Home meds  Hyzaar, klonopin, trazodone, paxil, spiriva, symbicort, singulair, nasonex  CC time 42 minutes  Chesley Mires, MD [facility] Pulmonary/Critical Care 11/10/2017, 9:01 AM

## 2017-11-23 NOTE — Progress Notes (Signed)
RT attempted A-line X2. Unsuccessful. MD aware, at bedside.

## 2017-11-23 NOTE — ED Notes (Signed)
Contacted pharmacy for antibiotics

## 2017-11-23 NOTE — Progress Notes (Signed)
[facility] Progress Note Patient Name: Jenny Branch DOB: 10-26-1955 MRN: [id number]   Date of Service  11/08/2017  HPI/Events of Note  Rhythm on bedside monitor appears to be AFIB with RVR. BP = 76/58 and HR = 150-160's. K+ = 3.5 and Creatinine = 2.72.   eICU Interventions  Will order: 1. Phenylephrine IV infusion. Titrate to MAP >= 65.  2. Amiodarone IV load and infusion.  3. Replace K+. 4. Mg++ level STAT. 5. Ground team notified of need to evaluate at bedside. She may need cardioversion.      Intervention Category Major Interventions: Arrhythmia - evaluation and management  Semiyah Newgent Eugene 11/03/2017, 8:09 PM

## 2017-11-23 NOTE — ED Provider Notes (Signed)
[facility] EMERGENCY DEPARTMENT Provider Note   CSN: [id number] Arrival date & time: 11/22/2017  0701     History   Chief Complaint Chief Complaint  Patient presents with  . Fall  . Shortness of Breath    HPI Jenny Branch is a 62 y.o. female.  62 year old female with prior medical history as documented below presents for evaluation of respiratory distress.  Patient reports that she was at home when she had a misstep in the floor.  She called the fire department for lifting assistance.  Fire department noted the patient was in significant respiratory distress upon their initial evaluation.  EMS was on scene and found the patient to be hypoxic to the 70's.  Patient was started on CPAP prior to arrival in the ED.  She appears improved on CPAP.  She denies current chest pain.  She denies recent fever.  Additional history is difficult to obtain given the patient's current condition.  The history is provided by the patient and medical records.  Shortness of Breath  This is a new problem. Duration: uncertain. Episode frequency: uncertain.The current episode started 3 to 5 hours ago. The problem has been gradually improving. Pertinent negatives include no fever and no chest pain. Associated medical issues include COPD.    No past medical history on file.  Patient Active Problem List   Diagnosis Date Noted  . CAP (community acquired pneumonia) 11/14/2017     OB History   None      Home Medications    Prior to Admission medications   Not on File    Family History No family history on file.  Social History Social History   Tobacco Use  . Smoking status: Not on file  Substance Use Topics  . Alcohol use: Not on file  . Drug use: Not on file     Allergies   Penicillins   Review of Systems Review of Systems  Constitutional: Negative for fever.  Respiratory: Positive for shortness of breath.   Cardiovascular: Negative for chest pain.  All other  systems reviewed and are negative.    Physical Exam Updated Vital Signs BP (!) 74/42   Pulse (!) 101   Resp (!) 25   SpO2 100%   Physical Exam  Constitutional: She is oriented to person, place, and time. She appears ill. She appears distressed.  HENT:  Head: Normocephalic and atraumatic.  Mouth/Throat: Oropharynx is clear and moist.  Eyes: Pupils are equal, round, and reactive to light. Conjunctivae and EOM are normal.  Neck: Normal range of motion. Neck supple.  Cardiovascular: Normal rate, regular rhythm and normal heart sounds.  Pulmonary/Chest: Effort normal. No respiratory distress. She has decreased breath sounds.  Decreased BS at bilateral bases  Abdominal: Soft. She exhibits no distension. There is no tenderness.  Musculoskeletal: Normal range of motion. She exhibits no edema or deformity.  Neurological: She is alert and oriented to person, place, and time.  Skin: Skin is dry. There is cyanosis.  Cool, mottled extremities   Psychiatric: She has a normal mood and affect.  Nursing note and vitals reviewed.    ED Treatments / Results  Labs (all labs ordered are listed, but only abnormal results are displayed) Labs Reviewed  PROTIME-INR - Abnormal; Notable for the following components:      Result Value   Prothrombin Time 18.9 (*)    All other components within normal limits  BRAIN NATRIURETIC PEPTIDE - Abnormal; Notable for the following components:  B Natriuretic Peptide 2,852.4 (*)    All other components within normal limits  I-STAT CHEM 8, ED - Abnormal; Notable for the following components:   Sodium 121 (*)    Chloride 80 (*)    BUN 43 (*)    Creatinine, Ser 3.00 (*)    Glucose, Bld 46 (*)    Calcium, Ion 0.89 (*)    Hemoglobin 19.0 (*)    HCT 56.0 (*)    All other components within normal limits  I-STAT TROPONIN, ED - Abnormal; Notable for the following components:   Troponin i, poc 1.12 (*)    All other components within normal limits  I-STAT CG4  LACTIC ACID, ED - Abnormal; Notable for the following components:   Lactic Acid, Venous 10.41 (*)    All other components within normal limits  I-STAT ARTERIAL BLOOD GAS, ED - Abnormal; Notable for the following components:   pH, Arterial 7.253 (*)    pCO2 arterial 63.0 (*)    pO2, Arterial 120.0 (*)    All other components within normal limits  CULTURE, BLOOD (ROUTINE X 2)  CULTURE, BLOOD (ROUTINE X 2)  EXPECTORATED SPUTUM ASSESSMENT W REFEX TO RESP CULTURE  GRAM STAIN  URINALYSIS, ROUTINE W REFLEX MICROSCOPIC  CBC WITH DIFFERENTIAL/PLATELET  TROPONIN I  COMPREHENSIVE METABOLIC PANEL  BLOOD GAS, ARTERIAL  RAPID URINE DRUG SCREEN, HOSP PERFORMED  ETHANOL  HIV ANTIBODY (ROUTINE TESTING W REFLEX)  STREP PNEUMONIAE URINARY ANTIGEN  CBC  CREATININE, SERUM  LEGIONELLA PNEUMOPHILA SEROGP 1 UR AG    EKG EKG Interpretation  Date/Time:  Monday November 23 2017 07:10:05 EDT Ventricular Rate:  102 PR Interval:    QRS Duration: 106 QT Interval:  390 QTC Calculation: 508 R Axis:   123 Text Interpretation:  Sinus tachycardia Atrial premature complex Right atrial enlargement Right ventricular hypertrophy Borderline T abnormalities, inferior leads Borderline prolonged QT interval Confirmed by Dene Gentry [phone]) on 11/29/2017 7:17:44 AM   Radiology Dg Chest 1 View  Result Date: 11/27/2017 CLINICAL DATA:  Shortness of breath. EXAM: CHEST  1 VIEW COMPARISON:  None. FINDINGS: There is an extensive consolidative infiltrate in the left upper lobe. Minimal atelectasis at the right lung base. Heart size and vascularity are normal. No definitive effusions. Bones are normal. IMPRESSION: Extensive left upper lobe consolidative infiltrate consistent with pneumonia. Electronically Signed   By: Lorriane Shire M.D.   On: 11/05/2017 07:43    Procedures Procedures (including critical care time) CRITICAL CARE Performed by: Valarie Merino   Total critical care time: 45 minutes  Critical care  time was exclusive of separately billable procedures and treating other patients.  Critical care was necessary to treat or prevent imminent or life-threatening deterioration.  Critical care was time spent personally by me on the following activities: development of treatment plan with patient and/or surrogate as well as nursing, discussions with consultants, evaluation of patient's response to treatment, examination of patient, obtaining history from patient or surrogate, ordering and performing treatments and interventions, ordering and review of laboratory studies, ordering and review of radiographic studies, pulse oximetry and re-evaluation of patient's condition.   Medications Ordered in ED Medications  sodium chloride 0.9 % bolus 500 mL (500 mLs Intravenous New Bag/Given 11/08/2017 0740)  meropenem (MERREM) 1 g in sodium chloride 0.9 % 100 mL IVPB (has no administration in time range)  vancomycin (VANCOCIN) 1,500 mg in sodium chloride 0.9 % 500 mL IVPB (has no administration in time range)  heparin injection 5,000 Units (has no  administration in time range)  0.9 %  sodium chloride infusion (has no administration in time range)  levofloxacin (LEVAQUIN) IVPB 750 mg (has no administration in time range)  hydrocortisone sodium succinate (SOLU-CORTEF) 100 MG injection 100 mg (100 mg Intravenous Given 11/16/2017 0740)     Initial Impression / Assessment and Plan / ED Course  I have reviewed the triage vital signs and the nursing notes.  Pertinent labs & imaging results that were available during my care of the patient were reviewed by me and considered in my medical decision making (see chart for details).     MDM  Screen complete  Patient is presenting for evaluation of respiratory distress.  Patient with evidence of a left upper lobe infiltrate on Chest Xray and likely pneumonia.  Patient given broad-spectrum antibiotics and respiratory support with BiPAP while in the ED.   Other screening  labs reveal significant elevated lactic acid, acute kidney injury, acidosis, and hyponatremia.   Critical care service contacted and they will evaluate the patient for admission.  Final Clinical Impressions(s) / ED Diagnoses   Final diagnoses:  Respiratory distress    ED Discharge Orders    None       Valarie Merino, MD 11/03/2017 [phone]

## 2017-11-23 NOTE — ED Triage Notes (Signed)
Pt in via [facility], per report Fire Dept initially on scene for fall, upon [facility] Ems arrival the pt was tachypnea and SOB & hypotensive, pt placed on CPAP, pt A&O x4, pt last BP 84/64, initial BP palpated  60 with HR 130, no details from EMS re: fall, pt c/o abd pain with constipation per pt, #18 R AC

## 2017-11-23 NOTE — Progress Notes (Signed)
RT NOTE: RT transported patient with RN from ED to 0H21 with no complications. RT will continue to monitor.

## 2017-11-23 NOTE — Progress Notes (Signed)
ANTICOAGULATION CONSULT NOTE - Initial Consult  Pharmacy Consult for Heparin Indication: chest pain/ACS  Allergies  Allergen Reactions  . Penicillins Itching    Details unknown    Patient Measurements: Height: 5\' 3"  (160 cm) Weight: 146 lb 2.6 oz (66.3 kg) IBW/kg (Calculated) : 52.4 Heparin Dosing Weight: 66 kg  Vital Signs: Temp: 98 F (36.7 C) (09/23 1910) Temp Source: Axillary (09/23 1910) BP: 104/64 (09/23 1940) Pulse Rate: 102 (09/23 1940)  Labs: Recent Labs    11/19/2017 0710 11/19/2017 0721 11/18/2017 1129 11/26/2017 1714  HGB 16.4* 19.0*  --   --   HCT 48.0* 56.0*  --   --   PLT 293  --   --   --   LABPROT 18.9*  --   --   --   INR 1.60  --   --   --   CREATININE 3.08* 3.00* 2.89* 2.72*  TROPONINI 1.03*  --  2.03* 3.32*    Estimated Creatinine Clearance: 20.4 mL/min (A) (by C-G formula based on SCr of 2.72 mg/dL (H)).   Medical History: No past medical history on file.  Assessment: 28 YOF with advanced COPD who presented on 9/23 with AMS, hypoxia, hypotension, and AKI. Now found to have positive troponins concerning for ACS and pharmacy consulted to start Heparin for anticoagulation.   The patient was on Heparin SQ for VTE prophylaxis - last dose of 5000 units at 1300 today. Will reduce the initial bolus slightly due to this recent dose.   Goal of Therapy:  Heparin level 0.3-0.7 units/ml Monitor platelets by anticoagulation protocol: Yes   Plan:  - Heparin 2000 unit bolus x 1 - Initiate Heparin drip at a rate of 800 units/hr (8 ml/hr) - Daily heparin level, CBC - Will continue to monitor for any signs/symptoms of bleeding and will follow up with heparin level in 8 hours   Thank you for allowing pharmacy to be a part of this patient's care.  Alycia Rossetti, PharmD, BCPS Clinical Pharmacist Pager: [phone] Please check AMION for all [facility] numbers 11/30/2017 7:50 PM

## 2017-11-23 NOTE — Progress Notes (Signed)
ER nurse reported nacl bolus of 500c given due to elevated BNP . Dr. Halford Chessman notified 1500 cc ordered to follow on sepsis protocol

## 2017-11-23 NOTE — ED Notes (Signed)
Pt's CBG result was 187. Informed Abigail Butts - RN.

## 2017-11-23 NOTE — Progress Notes (Signed)
Bilateral bruising on arms and legs patient states that her dogs jumps on her and that is where all the bruises are from.

## 2017-11-23 NOTE — Progress Notes (Signed)
PHARMACY NOTE:  ANTIMICROBIAL RENAL DOSAGE ADJUSTMENT  Current antimicrobial regimen includes a mismatch between antimicrobial dosage and estimated renal function.  As per policy approved by the Pharmacy & Therapeutics and Medical Executive Committees, the antimicrobial dosage will be adjusted accordingly.  Current antimicrobial dosage:  Merrem 1gm IV Q8H  Indication: sepsis 2/2 PNA  Renal Function:  Estimated Creatinine Clearance: 18.5 mL/min (A) (by C-G formula based on SCr of 3 mg/dL (H)). []      On intermittent HD, scheduled: []      On CRRT    Antimicrobial dosage has been changed to:  Merrem 1gm IV Q12H  Additional comments:   Thank you for allowing pharmacy to be a part of this patient's care.  Saket Hellstrom D. Mina Marble, PharmD, BCPS, [place] 11/08/2017, 10:52 AM

## 2017-11-23 NOTE — ED Notes (Signed)
Dr Francia Greaves informed of troponin results 1.12

## 2017-11-23 NOTE — Progress Notes (Signed)
RT notified of a respiratory distress coming to trauma A via EMS. RT placed pt on BIPAP with a medium mask with setting of  12/6, 60% FIO2. Pt resting comfortably. ED MD requested a STAT ABG. ABG obtained from Right Radial.   ABG results Ph 7.25 PCO2 63 HCO3 27 PaO2 98  MD asked this RT to increase pts Pressure Support on the BIPAP. Adjusted IPAP to 16. Pts current setting after change: 16/6 on 60%. Pt resting comfortably after change. RT will continue to monitor.

## 2017-11-23 NOTE — Procedures (Addendum)
Central Venous Catheter Insertion Procedure Note BRIAUNA GILMARTIN [id number] Mar 26, 1955  Procedure: Insertion of Central Venous Catheter Indications: Assessment of intravascular volume, Drug and/or fluid administration and Frequent blood sampling  Procedure Details Consent: Risks of procedure as well as the alternatives and risks of each were explained to the (patient/caregiver).  Consent for procedure obtained. Time Out: Verified patient identification, verified procedure, site/side was marked, verified correct patient position, special equipment/implants available, medications/allergies/relevent history reviewed, required imaging and test results available.  Performed  Maximum sterile technique was used including antiseptics, cap, gloves, gown, hand hygiene, mask and sheet. Skin prep: Chlorhexidine; local anesthetic administered A antimicrobial bonded/coated triple lumen catheter was placed in the left subclavian vein using the Seldinger technique.  Evaluation Blood flow good Complications: No apparent complications Patient did tolerate procedure well. Chest X-ray ordered to verify placement.  CXR: pending.   Montey Hora, [facility] Pager: [phone]  or [phone] 11/25/2017, 9:24 PM

## 2017-11-23 NOTE — Progress Notes (Signed)
  Echocardiogram 2D Echocardiogram has been performed.  Jenny Branch 11/06/2017, 3:44 PM

## 2017-11-23 NOTE — Progress Notes (Signed)
Received patient from day rn.  During initial assessment, troponin value was called to me from lab as greater than 3.  MD made aware and ground team was dispatched.  Pt HR went to 150's and sustained.  Pads applied, ekg retrieved, Ground team arrived and LR started, fentanyl given, cardioversion attempted twice.  After second time at 150 joules, successful attempt.  Aline placed and central line placed.  Mag and caklcium hung.  Heparin finally started.  Patient apears to have tolerated all procedures as best she could.  Hr =105 at this time

## 2017-11-23 NOTE — Progress Notes (Signed)
[facility] Progress Note Patient Name: Jenny Branch DOB: 05-Oct-1955 MRN: [id number]   Date of Service  11/24/2017  HPI/Events of Note  Troponin = 1.03 --> 2.03 --> 3.32. Cardiac Echo - EF = 60-65%, No wall motion abnormalities. EKG earlier today - NSR with PAC's, R axis deviation, RVH and ST/T abnormalities consider inferior ischemia.   eICU Interventions  Will order: 1. ASA Suppository 150 mg PR now and Q day. 2. Metoprolol 2.5 mg IV Q 6 hours. Hold for SBP < 100 or HR < 60.  3. Heparin IV infusion per pharmacy consult. 4. D/C Heparin [place]. 5. Continue to trend Troponin.     Intervention Category Intermediate Interventions: Diagnostic test evaluation  Lysle Dingwall 11/27/2017, 7:44 PM

## 2017-11-23 NOTE — Procedures (Signed)
Arterial Catheter Insertion Procedure Note Jenny Branch [id number] [birth year]  Procedure: Insertion of Arterial Catheter  Indications: Blood pressure monitoring  Procedure Details Consent: Unable to obtain consent because of emergent medical necessity. Time Out: Verified patient identification, verified procedure, site/side was marked, verified correct patient position, special equipment/implants available, medications/allergies/relevent history reviewed, required imaging and test results available.  Performed  Maximum sterile technique was used including antiseptics, cap, gloves, gown, hand hygiene, mask and sheet. Skin prep: Chlorhexidine; local anesthetic administered 22 gauge catheter was inserted into left radial artery using the Seldinger technique. ULTRASOUND GUIDANCE USED: YES Evaluation Blood flow good; BP tracing good. Complications: No apparent complications.   Jenny Branch 11/04/2017

## 2017-11-23 NOTE — ED Notes (Signed)
Dr Francia Greaves informed of chem 8 results and lactic acid results 10.41

## 2017-11-23 NOTE — Progress Notes (Signed)
Pharmacy Antibiotic Note  Jenny Branch is a 62 y.o. female admitted on 11/07/2017 with altered mental status. Patient has a history of COPD and is currently on bipap. Starting empiric abx for CAP. Admitted with SCr 3, unclear what baseline is, LA 10.4.    Plan: -Vancomycin 1500 mg IV x1 then 750 mg IV q24h -Meropenem 1 g IV q8h  -Levaquin 750 mg IV q48h -Monitor renal fx, cultures, LOT, VT as needed     Recent Labs  Lab 11/20/2017 0721 11/27/2017 0722  CREATININE 3.00*  --   LATICACIDVEN  --  10.41*     Antimicrobials this admission: 9/23 vancomycin > 9/23 meropenem > 9/23 levaquin >  Dose adjustments this admission: N/A  Microbiology results: 9/23 blood cx: 9/23 resp cx:   Harvel Quale 11/12/2017 8:44 AM

## 2017-11-23 NOTE — Significant Event (Addendum)
..   NAME:  ANDREIKA VANDAGRIFF, MRN:  [id number], DOB:  [birth year], LOS: 0 ADMISSION DATE:  11/22/2017   Brief History   62 yr old female with advanced COPD w/ LUL consolidation on NIPPV.  Called to bedside by E link for Afib RVR needing possible cardioversion  Pt attached to pads, cart in the room, EKG 12 lead done prior to procedure Significant Hospital Events   Unstable tachycardia  Procedures (surgical and bedside):  Synchronized Cardioversion: Pt awake and alert initial BP 76 systolic Given Fentanyl 50 mg prior to first attempt of cardioversion Synchronization successfully and charge of 100 J delivered  Pt was still in Afib and repeat BP did not improve Given an additional Fentanyl 50mg   Second attempt at 150 J successful now in sinus BP 433 systolic  Labs   CBC: Recent Labs  Lab 11/25/2017 0710 11/05/2017 0721  WBC 39.8*  --   NEUTROABS 38.2*  --   HGB 16.4* 19.0*  HCT 48.0* 56.0*  MCV 109.3*  --   PLT 293  --    Basic Metabolic Panel: Recent Labs  Lab 11/08/2017 0710 11/27/2017 0721 11/06/2017 1129 11/24/2017 1714  NA 127* 121* 127* 129*  K 4.2 3.9 3.2* 3.5  CL 75* 80* 76* 83*  CO2 24  --  30 25  GLUCOSE 41* 46* 66* 58*  BUN 35* 43* 33* 36*  CREATININE 3.08* 3.00* 2.89* 2.72*  CALCIUM 8.7*  --  7.6* 7.5*   GFR: Estimated Creatinine Clearance: 20.4 mL/min (A) (by C-G formula based on SCr of 2.72 mg/dL (H)). Recent Labs  Lab 11/29/2017 0710 11/18/2017 0722 11/17/2017 1129 11/27/2017 1403  PROCALCITON  --   --  11.85  --   WBC 39.8*  --   --   --   LATICACIDVEN  --  10.41*  --  4.8*   Liver Function Tests: Recent Labs  Lab 11/22/2017 0710  AST 95*  ALT 32  ALKPHOS 136*  BILITOT 2.4*  PROT 6.2*  ALBUMIN 2.7*   No results for input(s): LIPASE, AMYLASE in the last 168 hours. No results for input(s): AMMONIA in the last 168 hours. ABG    Component Value Date/Time   PHART 7.354 11/15/2017 1129   PCO2ART 56.0 (H) 11/12/2017 1129   PO2ART 115.0 (H) 11/19/2017 1129   HCO3 31.4 (H) 11/17/2017 1129   TCO2 33 (H) 11/22/2017 1129   ACIDBASEDEF 2.0 11/18/2017 0716   O2SAT 98.0 11/18/2017 1129    Coagulation Profile: Recent Labs  Lab 11/21/2017 0710  INR 1.60   Cardiac Enzymes: Recent Labs  Lab 11/20/2017 0710 11/06/2017 1129 11/12/2017 1714  TROPONINI 1.03* 2.03* 3.32*   HbA1C: No results found for: HGBA1C CBG: Recent Labs  Lab 11/18/2017 0911 11/08/2017 1016 11/01/2017 1123 11/02/2017 1505 11/11/2017 1908  GLUCAP 187* 105* 83 86 76  I, Dr Seward Carol have personally reviewed patient's available data, including medical history, events of note, physical examination and test results as part of my evaluation. I have discussed with PA Shearon Stalls and other care providers such as pharmacist, RN and Elink.  In addition,  I personally evaluated patient   Plan: Limited IV access- will place CVL- line team has tried NIPPV continues A-line for more accurate BP monitoring Post Aline and CVL we will start anticoagulation with heparin post cardioversion. I have had the opportunity to discuss the plan in detail with the patient and her NOK her Aunt who is now present at the bedside.   It has been  my pleasure caring for Ms. Malcomb The patient is critically ill with multiple organ systems failure and requires high complexity decision making for assessment and support, frequent evaluation and titration of therapies, application of advanced monitoring technologies and extensive interpretation of multiple databases.   Critical Care Time devoted to patient care services described in this note is  42 Minutes. This time reflects time of care of this signee Dr Seward Carol. This critical care time does not reflect procedure time, or teaching time or supervisory time of PA Shearon Stalls but could involve care discussion time   CC TIME:42  minutes CODE STATUS:FULL PROGNOSIS:Guarded  Dr. Seward Carol Pulmonary Critical Care Medicine  11/12/2017 9:11 PM

## 2017-11-24 ENCOUNTER — Inpatient Hospital Stay (HOSPITAL_COMMUNITY): Payer: PPO

## 2017-11-24 ENCOUNTER — Encounter (HOSPITAL_COMMUNITY): Payer: Self-pay | Admitting: Cardiovascular Disease

## 2017-11-24 DIAGNOSIS — Z7189 Other specified counseling: Secondary | ICD-10-CM

## 2017-11-24 DIAGNOSIS — R579 Shock, unspecified: Secondary | ICD-10-CM

## 2017-11-24 DIAGNOSIS — G9340 Encephalopathy, unspecified: Secondary | ICD-10-CM

## 2017-11-24 DIAGNOSIS — J9601 Acute respiratory failure with hypoxia: Secondary | ICD-10-CM

## 2017-11-24 DIAGNOSIS — Z515 Encounter for palliative care: Secondary | ICD-10-CM

## 2017-11-24 DIAGNOSIS — I248 Other forms of acute ischemic heart disease: Secondary | ICD-10-CM

## 2017-11-24 DIAGNOSIS — J449 Chronic obstructive pulmonary disease, unspecified: Secondary | ICD-10-CM

## 2017-11-24 LAB — POCT I-STAT 3, ART BLOOD GAS (G3+)
ACID-BASE DEFICIT: 2 mmol/L (ref 0.0–2.0)
ACID-BASE DEFICIT: 2 mmol/L (ref 0.0–2.0)
Acid-base deficit: 1 mmol/L (ref 0.0–2.0)
Acid-base deficit: 1 mmol/L (ref 0.0–2.0)
BICARBONATE: 26.2 mmol/L (ref 20.0–28.0)
Bicarbonate: 26.9 mmol/L (ref 20.0–28.0)
Bicarbonate: 29.1 mmol/L — ABNORMAL HIGH (ref 20.0–28.0)
Bicarbonate: 26.1 mmol/L (ref 20.0–28.0)
O2 SAT: 100 %
O2 SAT: 93 %
O2 Saturation: 97 %
O2 Saturation: 96 %
PCO2 ART: 54.2 mmHg — AB (ref 32.0–48.0)
PCO2 ART: 78.1 mmHg — AB (ref 32.0–48.0)
PCO2 ART: 54.5 mmHg — AB (ref 32.0–48.0)
PH ART: 7.304 — AB (ref 7.350–7.450)
PH ART: 7.181 — AB (ref 7.350–7.450)
PH ART: 7.278 — AB (ref 7.350–7.450)
Patient temperature: 98.9
Patient temperature: 98.3
Patient temperature: 99.4
TCO2: 29 mmol/L (ref 22–32)
TCO2: 31 mmol/L (ref 22–32)
TCO2: 28 mmol/L (ref 22–32)
TCO2: 28 mmol/L (ref 22–32)
pCO2 arterial: 55.7 mmHg — ABNORMAL HIGH (ref 32.0–48.0)
pH, Arterial: 7.291 — ABNORMAL LOW (ref 7.350–7.450)
pO2, Arterial: 100 mmHg (ref 83.0–108.0)
pO2, Arterial: 413 mmHg — ABNORMAL HIGH (ref 83.0–108.0)
pO2, Arterial: 76 mmHg — ABNORMAL LOW (ref 83.0–108.0)
pO2, Arterial: 98 mmHg (ref 83.0–108.0)

## 2017-11-24 LAB — GLUCOSE, CAPILLARY
GLUCOSE-CAPILLARY: 108 mg/dL — AB (ref 70–99)
GLUCOSE-CAPILLARY: 54 mg/dL — AB (ref 70–99)
GLUCOSE-CAPILLARY: 86 mg/dL (ref 70–99)
Glucose-Capillary: 125 mg/dL — ABNORMAL HIGH (ref 70–99)
Glucose-Capillary: 133 mg/dL — ABNORMAL HIGH (ref 70–99)
Glucose-Capillary: 52 mg/dL — ABNORMAL LOW (ref 70–99)
Glucose-Capillary: 59 mg/dL — ABNORMAL LOW (ref 70–99)
Glucose-Capillary: 73 mg/dL (ref 70–99)
Glucose-Capillary: 78 mg/dL (ref 70–99)
Glucose-Capillary: 87 mg/dL (ref 70–99)

## 2017-11-24 LAB — BASIC METABOLIC PANEL
ANION GAP: 17 — AB (ref 5–15)
BUN: 34 mg/dL — ABNORMAL HIGH (ref 6–20)
CALCIUM: 7.9 mg/dL — AB (ref 8.9–10.3)
CHLORIDE: 85 mmol/L — AB (ref 98–111)
CO2: 26 mmol/L (ref 22–32)
CREATININE: 2.6 mg/dL — AB (ref 0.44–1.00)
GFR calc non Af Amer: 19 mL/min — ABNORMAL LOW (ref >60)
GFR, EST AFRICAN AMERICAN: 22 mL/min — AB (ref >60)
Glucose, Bld: 63 mg/dL — ABNORMAL LOW (ref 70–99)
Potassium: 4 mmol/L (ref 3.5–5.1)
SODIUM: 128 mmol/L — AB (ref 135–145)

## 2017-11-24 LAB — CBC
HEMATOCRIT: 42 % (ref 36.0–46.0)
HEMOGLOBIN: 14.9 g/dL (ref 12.0–15.0)
MCH: 36.3 pg — AB (ref 26.0–34.0)
MCHC: 35.5 g/dL (ref 30.0–36.0)
MCV: 102.4 fL — ABNORMAL HIGH (ref 78.0–100.0)
PLATELETS: 241 10*3/uL (ref 150–400)
RBC: 4.1 MIL/uL (ref 3.87–5.11)
RDW: 16.3 % — ABNORMAL HIGH (ref 11.5–15.5)
WBC: 46.2 10*3/uL — ABNORMAL HIGH (ref 4.0–10.5)

## 2017-11-24 LAB — HEPARIN LEVEL (UNFRACTIONATED)
HEPARIN UNFRACTIONATED: 0.13 [IU]/mL — AB (ref 0.30–0.70)
HEPARIN UNFRACTIONATED: 0.3 [IU]/mL (ref 0.30–0.70)
Heparin Unfractionated: 0.29 IU/mL — ABNORMAL LOW (ref 0.30–0.70)

## 2017-11-24 LAB — COMPREHENSIVE METABOLIC PANEL
ALBUMIN: 2.1 g/dL — AB (ref 3.5–5.0)
ALK PHOS: 127 U/L — AB (ref 38–126)
ALT: 43 U/L (ref 0–44)
ANION GAP: 18 — AB (ref 5–15)
AST: 93 U/L — AB (ref 15–41)
BILIRUBIN TOTAL: 2.4 mg/dL — AB (ref 0.3–1.2)
BUN: 38 mg/dL — AB (ref 6–20)
CO2: 24 mmol/L (ref 22–32)
Calcium: 7.9 mg/dL — ABNORMAL LOW (ref 8.9–10.3)
Chloride: 85 mmol/L — ABNORMAL LOW (ref 98–111)
Creatinine, Ser: 2.83 mg/dL — ABNORMAL HIGH (ref 0.44–1.00)
GFR calc Af Amer: 20 mL/min — ABNORMAL LOW (ref >60)
GFR calc non Af Amer: 17 mL/min — ABNORMAL LOW (ref >60)
GLUCOSE: 68 mg/dL — AB (ref 70–99)
POTASSIUM: 4.6 mmol/L (ref 3.5–5.1)
SODIUM: 127 mmol/L — AB (ref 135–145)
Total Protein: 4.9 g/dL — ABNORMAL LOW (ref 6.5–8.1)

## 2017-11-24 LAB — HIV ANTIBODY (ROUTINE TESTING W REFLEX): HIV Screen 4th Generation wRfx: NONREACTIVE

## 2017-11-24 LAB — LEGIONELLA PNEUMOPHILA SEROGP 1 UR AG: L. PNEUMOPHILA SEROGP 1 UR AG: NEGATIVE

## 2017-11-24 LAB — CORTISOL: CORTISOL PLASMA: 42.7 ug/dL

## 2017-11-24 MED ORDER — SODIUM CHLORIDE 0.9 % IV BOLUS
500.0000 mL | Freq: Once | INTRAVENOUS | Status: AC
  Administered 2017-11-24: 500 mL via INTRAVENOUS

## 2017-11-24 MED ORDER — FENTANYL CITRATE (PF) 100 MCG/2ML IJ SOLN
100.0000 ug | Freq: Once | INTRAMUSCULAR | Status: DC
  Filled 2017-11-24: qty 2

## 2017-11-24 MED ORDER — DEXMEDETOMIDINE HCL IN NACL 400 MCG/100ML IV SOLN
0.4000 ug/kg/h | INTRAVENOUS | Status: DC
  Administered 2017-11-24: 0.6 ug/kg/h via INTRAVENOUS
  Administered 2017-11-24: 1 ug/kg/h via INTRAVENOUS
  Administered 2017-11-25 – 2017-11-27 (×11): 1.2 ug/kg/h via INTRAVENOUS
  Filled 2017-11-24 (×15): qty 100

## 2017-11-24 MED ORDER — IBUPROFEN 100 MG/5ML PO SUSP
800.0000 mg | Freq: Three times a day (TID) | ORAL | Status: DC | PRN
  Filled 2017-11-24: qty 40

## 2017-11-24 MED ORDER — DEXTROSE 50 % IV SOLN
INTRAVENOUS | Status: AC
  Filled 2017-11-24: qty 50

## 2017-11-24 MED ORDER — DEXTROSE 50 % IV SOLN
INTRAVENOUS | Status: AC
  Administered 2017-11-24: 50 mL
  Filled 2017-11-24: qty 50

## 2017-11-24 MED ORDER — FENTANYL CITRATE (PF) 100 MCG/2ML IJ SOLN
100.0000 ug | Freq: Once | INTRAMUSCULAR | Status: AC
  Administered 2017-11-24: 100 ug via INTRAVENOUS

## 2017-11-24 MED ORDER — NOREPINEPHRINE 16 MG/250ML-% IV SOLN
0.0000 ug/min | INTRAVENOUS | Status: DC
  Administered 2017-11-24: 4 ug/min via INTRAVENOUS
  Filled 2017-11-24 (×2): qty 250

## 2017-11-24 MED ORDER — MIDAZOLAM HCL 2 MG/2ML IJ SOLN
2.0000 mg | Freq: Once | INTRAMUSCULAR | Status: AC
  Administered 2017-11-24: 2 mg via INTRAVENOUS

## 2017-11-24 MED ORDER — PHENYLEPHRINE HCL-NACL 40-0.9 MG/250ML-% IV SOLN
0.0000 ug/min | INTRAVENOUS | Status: DC
  Administered 2017-11-24: 150 ug/min via INTRAVENOUS
  Administered 2017-11-24: 200 ug/min via INTRAVENOUS
  Administered 2017-11-24: 150 ug/min via INTRAVENOUS
  Filled 2017-11-24 (×4): qty 250

## 2017-11-24 MED ORDER — ETOMIDATE 2 MG/ML IV SOLN
20.0000 mg | Freq: Once | INTRAVENOUS | Status: AC
  Administered 2017-11-24: 20 mg via INTRAVENOUS

## 2017-11-24 MED ORDER — SODIUM CHLORIDE 0.9 % IV SOLN
500.0000 mg | INTRAVENOUS | Status: DC
  Administered 2017-11-25: 500 mg via INTRAVENOUS
  Filled 2017-11-24: qty 500

## 2017-11-24 MED ORDER — SODIUM CHLORIDE 0.9 % IV SOLN
1.2500 ng/kg/min | INTRAVENOUS | Status: AC
  Administered 2017-11-24: 5 ng/kg/min via INTRAVENOUS
  Administered 2017-11-25 – 2017-11-26 (×2): 40 ng/kg/min via INTRAVENOUS
  Filled 2017-11-24 (×3): qty 1

## 2017-11-24 MED ORDER — HYDROCORTISONE NA SUCCINATE PF 100 MG IJ SOLR
50.0000 mg | Freq: Four times a day (QID) | INTRAMUSCULAR | Status: DC
  Administered 2017-11-24 – 2017-11-27 (×11): 50 mg via INTRAVENOUS
  Filled 2017-11-24 (×12): qty 2

## 2017-11-24 MED ORDER — FENTANYL 2500MCG IN NS 250ML (10MCG/ML) PREMIX INFUSION
25.0000 ug/h | INTRAVENOUS | Status: DC
  Administered 2017-11-26: 25 ug/h via INTRAVENOUS
  Filled 2017-11-24 (×2): qty 250

## 2017-11-24 MED ORDER — VASOPRESSIN 20 UNIT/ML IV SOLN
0.0300 [IU]/min | INTRAVENOUS | Status: DC
  Administered 2017-11-24 – 2017-11-25 (×2): 0.03 [IU]/min via INTRAVENOUS
  Filled 2017-11-24 (×3): qty 2

## 2017-11-24 MED ORDER — FENTANYL CITRATE (PF) 100 MCG/2ML IJ SOLN
50.0000 ug | Freq: Once | INTRAMUSCULAR | Status: AC
  Administered 2017-11-24: 50 ug via INTRAVENOUS
  Filled 2017-11-24: qty 2

## 2017-11-24 MED ORDER — HEPARIN BOLUS VIA INFUSION
1500.0000 [IU] | Freq: Once | INTRAVENOUS | Status: AC
  Administered 2017-11-24: 1500 [IU] via INTRAVENOUS
  Filled 2017-11-24: qty 1500

## 2017-11-24 MED ORDER — ROCURONIUM BROMIDE 50 MG/5ML IV SOLN
50.0000 mg | Freq: Once | INTRAVENOUS | Status: AC
  Administered 2017-11-24: 50 mg via INTRAVENOUS
  Filled 2017-11-24: qty 5

## 2017-11-24 MED ORDER — ORAL CARE MOUTH RINSE
15.0000 mL | OROMUCOSAL | Status: DC
  Administered 2017-11-24 – 2017-11-27 (×26): 15 mL via OROMUCOSAL

## 2017-11-24 MED ORDER — DEXTROSE 50 % IV SOLN
25.0000 mL | Freq: Once | INTRAVENOUS | Status: AC
  Administered 2017-11-24: 25 mL via INTRAVENOUS

## 2017-11-24 MED ORDER — FENTANYL BOLUS VIA INFUSION
50.0000 ug | INTRAVENOUS | Status: DC | PRN
  Filled 2017-11-24: qty 50

## 2017-11-24 MED ORDER — CHLORHEXIDINE GLUCONATE 0.12% ORAL RINSE (MEDLINE KIT)
15.0000 mL | Freq: Two times a day (BID) | OROMUCOSAL | Status: DC
  Administered 2017-11-24 – 2017-11-27 (×6): 15 mL via OROMUCOSAL

## 2017-11-24 MED ORDER — MIDAZOLAM HCL 2 MG/2ML IJ SOLN
INTRAMUSCULAR | Status: AC
  Filled 2017-11-24: qty 2

## 2017-11-24 MED FILL — Medication: Qty: 1 | Status: AC

## 2017-11-24 NOTE — Progress Notes (Addendum)
Called by bedside RN, patient is not responding to IVF and neo.  SBP in the 50's.  Added stress dose steroids, levophed and vasopressin.  Pressure is minimally responsive.  There was a concern that OGT had blood in it.  That was lavaged and no blood noted.  Aunt is bedside.  I am concerned that with lack of response to IVF and pressors that patient may be refractory to treatment.  Had a discussion with aunt.  Patient had had a discussion with her that she would not want to be put on life support.  After discussion, decision was made to make patient a LCB with no CPR/cardioversion.  Continue current treatment.  However, if a-fib with RVR as in last night then ok to sync cardiovert.  Will change order in the computer.  Patient with some response to pressors so far.  I believe that positive troponins are from demand ischemia not true CAD.  Will add Giapriza as a final attempt here.  The patient is critically ill with multiple organ systems failure and requires high complexity decision making for assessment and support, frequent evaluation and titration of therapies, application of advanced monitoring technologies and extensive interpretation of multiple databases.   Critical Care Time devoted to patient care services described in this note is  60  Minutes. This time reflects time of care of this signee Dr [name]. This critical care time does not reflect procedure time, or teaching time or supervisory time of PA/NP/Med student/Med Resident etc but could involve care discussion time.  Rush Farmer, M.D. [facility] Pulmonary/Critical Care Medicine. Pager: [phone]. After hours pager: [phone].

## 2017-11-24 NOTE — Progress Notes (Signed)
Initial Nutrition Assessment  DOCUMENTATION CODES:   Not applicable  INTERVENTION:   When medical status allows, recommend start TF via OGT:   Vital AF 1.2 at 55 ml/h (1320 ml per day)  Provides 1584 kcal, 99 gm protein, 1071 ml free water daily  NUTRITION DIAGNOSIS:   Inadequate oral intake related to inability to eat as evidenced by NPO status.  GOAL:   Patient will meet greater than or equal to 90% of their needs  MONITOR:   Vent status, Labs, I & O's  REASON FOR ASSESSMENT:   Ventilator    ASSESSMENT:   62 yo female with PMH of tobacco abuse, advanced COPD, HTN, PTSD, and depression who was admitted on 9/23 with CAP, acute on chronic respiratory failure, acute renal failure. Required intubation on 9/24.  Discussed patient with RN and RT today. Patient required intubation earlier today. MV: 12.5 L/min Temp (24hrs), Avg:98.7 F (37.1 C), Min:98 F (36.7 C), Max:99.1 F (37.3 C)   Labs reviewed. Sodium 127 (L), BUN 38 (H), creatinine 2.83 (H) Medications reviewed and include Solu-cortef, Levophed, neosynephrine, vasopressin.  Weight history unknown.  NUTRITION - FOCUSED PHYSICAL EXAM:  unable to complete NFPE at this time  Diet Order:   Diet Order            Diet NPO time specified Except for: Sips with Meds  Diet effective now              EDUCATION NEEDS:   No education needs have been identified at this time  Skin:  Skin Assessment: Reviewed RN Assessment  Last BM:  unknown  Height:   Ht Readings from Last 1 Encounters:  11/22/2017 5\' 3"  (1.6 m)    Weight:   Wt Readings from Last 1 Encounters:  11/17/2017 66.3 kg    Ideal Body Weight:  52.3 kg  BMI:  Body mass index is 25.89 kg/m.  Estimated Nutritional Needs:   Kcal:  5784  Protein:  90-105 gm  Fluid:  >/= 1.6 L    Molli Barrows, RD, LDN, [place] Pager [phone] After Hours Pager [phone]

## 2017-11-24 NOTE — Procedures (Deleted)
Bronchoscopy Procedure Note Jenny Branch [id number] [birth year]  Procedure: Bronchoscopy Indications: Remove secretions  Procedure Details Consent: Risks of procedure as well as the alternatives and risks of each were explained to the (patient/caregiver).  Consent for procedure obtained. Time Out: Verified patient identification, verified procedure, site/side was marked, verified correct patient position, special equipment/implants available, medications/allergies/relevent history reviewed, required imaging and test results available.  Performed  In preparation for procedure, patient was given 100% FiO2 and bronchoscope lubricated. Sedation: Etomidate  Airway entered and the following bronchi were examined: RUL, RML, RLL, LUL, LLL and Bronchi.   Very copious secretions from the left lung with some blood in the left mainstem that were all removed. Bronchoscope removed.  , Patient placed back on 100% FiO2 at conclusion of procedure.    Evaluation Hemodynamic Status: BP stable throughout; O2 sats: stable throughout Patient's Current Condition: stable Specimens:  Sent serosanguinous fluid Complications: No apparent complications Patient did tolerate procedure well.   Jenny Branch 11/24/2017

## 2017-11-24 NOTE — Progress Notes (Signed)
[facility] for Heparin Indication: chest pain/ACS, afib  Allergies  Allergen Reactions  . Penicillins Itching    Details unknown    Patient Measurements: Height: 5\' 3"  (160 cm) Weight: 146 lb 2.6 oz (66.3 kg) IBW/kg (Calculated) : 52.4 Heparin Dosing Weight: 66 kg  Vital Signs: Temp: 103.5 F (39.7 C) (09/24 2300) Temp Source: Rectal (09/24 2300) BP: 105/70 (09/24 2200) Pulse Rate: 104 (09/24 2200)  Labs: Recent Labs    11/01/2017 0710 11/13/2017 0721 11/27/2017 1129 11/24/2017 1714 11/29/2017 2017 11/06/2017 2322 11/24/17 0558 11/24/17 1338 11/24/17 2306  HGB 16.4* 19.0*  --   --   --   --  14.9  --   --   HCT 48.0* 56.0*  --   --   --   --  42.0  --   --   PLT 293  --   --   --   --   --  241  --   --   LABPROT 18.9*  --   --   --   --   --   --   --   --   INR 1.60  --   --   --   --   --   --   --   --   HEPARINUNFRC  --   --   --   --   --   --  0.13* 0.30 0.29*  CREATININE 3.08* 3.00* 2.89* 2.72*  --  2.60* 2.83*  --   --   TROPONINI 1.03*  --  2.03* 3.32* 3.69*  --   --   --   --     Estimated Creatinine Clearance: 19.6 mL/min (A) (by C-G formula based on SCr of 2.83 mg/dL (H)).   Medical History: Past Medical History:  Diagnosis Date  . COPD (chronic obstructive pulmonary disease) (HCC)     Assessment: 66 YOF with advanced COPD who presented on 9/23 with AMS, hypoxia, hypotension, and AKI. Now found to have positive troponins concerning for ACS and pharmacy consulted to start Heparin for anticoagulation.  She also required cardioversion for afib  Heparin level remains low despite rate increase, no issues per RN.   Goal of Therapy:  Heparin level 0.3-0.7 units/ml Monitor platelets by anticoagulation protocol: Yes   Plan:  Inc heparin to 1250 units/hr Repeat heparin level with AM labs  Narda Bonds, PharmD, [place] Pharmacist Phone: [phone]

## 2017-11-24 NOTE — Consult Note (Signed)
Cardiology Consultation:   Patient ID: Jenny Branch MRN: [id number]; DOB: [birth year]  Admit date: 11/15/2017 Date of Consult: 11/24/2017  Primary Care Provider: Lottie Dawson, MD Primary Cardiologist: No primary care provider on file.  Primary Electrophysiologist:  None    Patient Profile:   Jenny Branch is a 62 y.o. female with a hx of severe COPD and extensive smoking who is being seen today for the evaluation of elevated troponin at the request of Dr Nelda Marseille.  History of Present Illness:   Ms. Game is critically ill in the MICU with pneumonia and septic shock. She has underlying advanced COPD with continued smoking. She is noted to have elevated troponin and markedly elevated BNP on lab evaluation.   The patient is now on multiple pressor agents, required intubation earlier today. There is no history available other than through chart review. Her aunt is at the bedside and she states the patient is in poor health at baseline, but has no past hx of cardiac problems. She has not had chest pain. She has been made a limited code.   Past Medical History:  Diagnosis Date  . COPD (chronic obstructive pulmonary disease) (HCC)        Home Medications:  Prior to Admission medications   Medication Sig Start Date End Date Taking? Authorizing Provider  albuterol (PROVENTIL HFA;VENTOLIN HFA) 108 (90 Base) MCG/ACT inhaler Inhale 2 puffs into the lungs every 6 (six) hours as needed for wheezing. 10/15/17  Yes [provider]  albuterol (PROVENTIL) (2.5 MG/3ML) 0.083% nebulizer solution Take 3 mLs by nebulization at bedtime. 10/12/17  Yes [provider]  cetirizine (ZYRTEC) 10 MG tablet Take 10 mg by mouth daily.   Yes [provider]  montelukast (SINGULAIR) 10 MG tablet Take 10 mg by mouth at bedtime. 11/09/17  Yes [provider]  PARoxetine (PAXIL) 20 MG tablet Take 20 mg by mouth every morning. 10/19/17  Yes [provider]    predniSONE (DELTASONE) 5 MG tablet Take 5 mg by mouth daily before breakfast. 09/01/17  Yes [provider]  ranitidine (ZANTAC) 150 MG tablet Take 150 mg by mouth daily.   Yes [provider]  SPIRIVA HANDIHALER 18 MCG inhalation capsule Place 1 capsule into inhaler and inhale daily.  11/09/17  Yes [provider]  SYMBICORT 160-4.5 MCG/ACT inhaler Inhale 2 puffs into the lungs 2 (two) times daily. 11/09/17  Yes [provider]    Inpatient Medications: Scheduled Meds: . hydrocortisone sodium succinate  50 mg Intravenous Q6H  . ipratropium-albuterol  3 mL Nebulization Q6H  . montelukast  10 mg Oral QHS  . pantoprazole  40 mg Oral Daily   Continuous Infusions: . sodium chloride    . sodium chloride    . angiotensin II (GIAPREZA) infusion 40 ng/kg/min (11/24/17 1700)  . [START ON 11/25/2017] azithromycin    . dexmedetomidine (PRECEDEX) IV infusion 0.8 mcg/kg/hr (11/24/17 1700)  . dextrose 5 % and 0.9% NaCl 75 mL/hr at 11/24/17 1700  . fentaNYL infusion INTRAVENOUS    . heparin 1,100 Units/hr (11/24/17 1700)  . meropenem (MERREM) IV Stopped (11/24/17 0848)  . norepinephrine (LEVOPHED) Adult infusion 15 mcg/min (11/24/17 1700)  . phenylephrine (NEO-SYNEPHRINE) Adult infusion Stopped (11/24/17 1654)  . vasopressin (PITRESSIN) infusion - *FOR SHOCK* 0.03 Units/min (11/24/17 1700)   PRN Meds: Place/Maintain arterial line **AND** sodium chloride, Place/Maintain arterial line **AND** sodium chloride, acetaminophen **OR** acetaminophen, albuterol, fentaNYL  Allergies:    Allergies  Allergen Reactions  . Penicillins  Itching    Details unknown    Social History:   Social History   Socioeconomic History  . Marital status: Single    Spouse name: Not on file  . Number of children: Not on file  . Years of education: Not on file  . Highest education level: Not on file  Occupational History  . Not on file  Social Needs  . Financial resource strain: Not  on file  . Food insecurity:    Worry: Not on file    Inability: Not on file  . Transportation needs:    Medical: Not on file    Non-medical: Not on file  Tobacco Use  . Smoking status: Current Every Day Smoker  . Smokeless tobacco: Current User  Substance and Sexual Activity  . Alcohol use: Not on file  . Drug use: Yes    Types: Marijuana  . Sexual activity: Not on file  Lifestyle  . Physical activity:    Days per week: Not on file    Minutes per session: Not on file  . Stress: Not on file  Relationships  . Social connections:    Talks on phone: Not on file    Gets together: Not on file    Attends religious service: Not on file    Active member of club or organization: Not on file    Attends meetings of clubs or organizations: Not on file    Relationship status: Not on file  . Intimate partner violence:    Fear of current or ex partner: Not on file    Emotionally abused: Not on file    Physically abused: Not on file    Forced sexual activity: Not on file  Other Topics Concern  . Not on file  Social History Narrative  . Not on file    Family History:   No family history on file. Unable to obtain secondary to her critical illness.  ROS:  Please see the history of present illness.  Not obtainable.  Physical Exam/Data:   Vitals:   11/24/17 1423 11/24/17 1445 11/24/17 1531 11/24/17 1658  BP:   (!) 68/36   Pulse:      Resp:      Temp:    99.4 F (37.4 C)  TempSrc:    Oral  SpO2: 97% 96% 100%   Weight:      Height:        Intake/Output Summary (Last 24 hours) at 11/24/2017 1718 Last data filed at 11/24/2017 1700 Gross per 24 hour  Intake 3320.99 ml  Output 190 ml  Net 3130.99 ml   Filed Weights   11/14/2017 1010  Weight: 66.3 kg   Body mass index is 25.89 kg/m.  General:  Ill-appearing patient on the ventilator, unresponsive HEENT: normal Lymph: no adenopathy Neck: no JVD Endocrine:  No thryomegaly Vascular: No carotid bruits Cardiac:  normal S1,  S2; RRR; no murmur distant heart sounds Lungs:  Diffuse rhonchi and rales  Abd: soft, no hepatomegaly  Ext: no edema Musculoskeletal:  No deformities Skin: feet are cold, mottled Neuro:  unresponsive  EKG:  The EKG was personally reviewed and demonstrates:  Sinus rhythm with RVH and pulmonary disease pattern  Telemetry:  Telemetry was personally reviewed and demonstrates:  Sinus rhythm, sinus tachycardia  Relevant CV Studies: Echo: Study Conclusions  - Left ventricle: The cavity size was normal. Wall thickness was   increased in a pattern of mild LVH. Systolic function was normal.   The estimated ejection  fraction was in the range of 60% to 65%.   Doppler parameters are consistent with abnormal left ventricular   relaxation (grade 1 diastolic dysfunction). - Right ventricle: The cavity size was moderately to severely   dilated. Systolic function was moderately reduced. - Pericardium, extracardiac: A small pericardial effusion was   identified. There was no evidence of hemodynamic compromise.  Impressions:  - Technically difficult images with very poor image quality   The LV function appears to be well preserved.   The RV is dilated and hypocontractile.   Consider pulmonary embolus  Laboratory Data:  Chemistry Recent Labs  Lab 11/25/2017 1714 11/11/2017 2322 11/24/17 0558  NA 129* 128* 127*  K 3.5 4.0 4.6  CL 83* 85* 85*  CO2 25 26 24   GLUCOSE 58* 63* 68*  BUN 36* 34* 38*  CREATININE 2.72* 2.60* 2.83*  CALCIUM 7.5* 7.9* 7.9*  GFRNONAA 18* 19* 17*  GFRAA 21* 22* 20*  ANIONGAP 21* 17* 18*    Recent Labs  Lab 11/27/2017 0710 11/24/17 0558  PROT 6.2* 4.9*  ALBUMIN 2.7* 2.1*  AST 95* 93*  ALT 32 43  ALKPHOS 136* 127*  BILITOT 2.4* 2.4*   Hematology Recent Labs  Lab 11/17/2017 0710 11/14/2017 0721 11/24/17 0558  WBC 39.8*  --  46.2*  RBC 4.39  --  4.10  HGB 16.4* 19.0* 14.9  HCT 48.0* 56.0* 42.0  MCV 109.3*  --  102.4*  MCH 37.4*  --  36.3*  MCHC 34.2  --   35.5  RDW 16.8*  --  16.3*  PLT 293  --  241   Cardiac Enzymes Recent Labs  Lab 11/04/2017 0710 11/22/2017 1129 11/13/2017 1714 11/22/2017 2017  TROPONINI 1.03* 2.03* 3.32* 3.69*    Recent Labs  Lab 11/24/2017 0720  TROPIPOC 1.12*    BNP Recent Labs  Lab 11/19/2017 0710  BNP 2,852.4*    DDimer No results for input(s): DDIMER in the last 168 hours.  Radiology/Studies:  Dg Chest 1 View  Result Date: 11/11/2017 CLINICAL DATA:  Shortness of breath. EXAM: CHEST  1 VIEW COMPARISON:  None. FINDINGS: There is an extensive consolidative infiltrate in the left upper lobe. Minimal atelectasis at the right lung base. Heart size and vascularity are normal. No definitive effusions. Bones are normal. IMPRESSION: Extensive left upper lobe consolidative infiltrate consistent with pneumonia. Electronically Signed   By: Lorriane Shire M.D.   On: 11/19/2017 07:43   Dg Chest Port 1 View  Result Date: 11/24/2017 CLINICAL DATA:  Status post placement of endotracheal and orogastric tubes. EXAM: PORTABLE CHEST 1 VIEW COMPARISON:  Portable chest x-ray of earlier today. FINDINGS: The endotracheal tube tip projects 3.9 cm above the carina. The esophagogastric tube tip and proximal port project below the inferior margin of the image. There is moderate gaseous distention of what appears to be the stomach. The cardiopericardial silhouette remains enlarged. There is dense infiltrate in the left upper lobe. The right lung is grossly clear. IMPRESSION: Reasonable positioning of the endotracheal and esophagogastric tubes. Electronically Signed   By: David  Martinique M.D.   On: 11/24/2017 12:35   Dg Chest Port 1 View  Result Date: 11/24/2017 CLINICAL DATA:  Community acquired pneumonia. EXAM: PORTABLE CHEST 1 VIEW COMPARISON:  11/06/2017. FINDINGS: Left subclavian line noted in stable position. Stable cardiomegaly. Dense left upper lung infiltrate/consolidation without interval change from prior exam. IMPRESSION: 1.  Left  subclavian line stable position. 2. Dense left upper lung infiltrate/consolidation without interval change. Followup PA and lateral  chest X-ray is recommended in 3-4 weeks following trial of antibiotic therapy to ensure resolution and exclude underlying malignancy. Electronically Signed   By: Marcello Moores  Register   On: 11/24/2017 10:02   Dg Chest Port 1 View  Result Date: 11/27/2017 CLINICAL DATA:  62 year old female with central line placement. EXAM: PORTABLE CHEST 1 VIEW COMPARISON:  Chest radiograph dated 11/11/2017 FINDINGS: Left subclavian central venous catheter with tip over central SVC. No pneumothorax. Consolidative changes of the left upper lobe with progression of the airspace disease in the left mid to lower lung field since the earlier radiograph. There is no pleural effusion top-normal cardiac size. No acute osseous pathology. IMPRESSION: 1. Left subclavian central line with tip over central SVC. No pneumothorax. 2. Interval progression of the left lung opacities. Electronically Signed   By: Anner Crete M.D.   On: 11/18/2017 22:13    Assessment and Plan:   1. Septic shock, multiple pressor agents 2. VDRF 3. Demand ischemia: suspect critical illness/shock/acute renal failure contribute. Possible underlying CAD with heavy smoking history but patient critically ill and unstable for any further cardiac evaluation. I do not think this is ACS.  4. AKI - creatinine stable at 2.8 mg/dL 5. Acute diastolic heart failure: markedly elevated BNP, treating shock per CCM team. LV systolic function is normal.  6. AFib with RVR: back in NSR, cardioverted last night. Avoid anticoagulation in this patient who had coffee ground emesis earlier.     For questions or updates, please contact [place] Please consult [email or web address] for contact info under     Signed, Sherren Mocha, MD  11/24/2017 5:18 PM

## 2017-11-24 NOTE — Progress Notes (Signed)
NAME:  Jenny Branch, MRN:  [id number], DOB:  [birth year], LOS: 1 ADMISSION DATE:  11/04/2017, CONSULTATION DATE:  11/23/2008 REFERRING MD:  Dr Angela Adam, ER CHIEF COMPLAINT:  Short of breath   Brief History   62 yo female smoker with altered mental status, hypoxia/hypercapnia, hypotension, hyponatremia, acute renal failure.  Found to have Lt upper lobe consolidation on CXR.  Followed by Dr. Lake Bells in pulmonary office for advanced COPD.  Past Medical History Hypertension, PTSD, Depression  Significant Hospital Events   9/23 Admit  Consults: date of consult/date signed off & final recs:  None  Procedures (surgical and bedside):    Significant Diagnostic Tests:   Micro Data: Blood 9/23 >> Sputum 9/23 >> HIV 9/23 >> Legionella 9/23 >> Pneumococcal 9/23 >>  Antimicrobials:  Levaquin 9/23 >>9/24 Merrem 9/24>>> Vancomycin 9/23 >> 9/23  Subjective:  Pt not able to proved information due to altered mental status.  Objective   Blood pressure (!) 104/59, pulse (!) 36, temperature 99.1 F (37.3 C), temperature source Axillary, resp. rate (!) 27, height 5\' 3"  (1.6 m), weight 66.3 kg, SpO2 96 %. CVP:  [15 mmHg] 15 mmHg  FiO2 (%):  [50 %-60 %] 50 %   Intake/Output Summary (Last 24 hours) at 11/24/2017 8250 Last data filed at 11/24/2017 0500 Gross per 24 hour  Intake 1612.53 ml  Output 290 ml  Net 1322.53 ml   Filed Weights   11/06/2017 1010  Weight: 66.3 kg    Examination:  General - Somnolent but arousable, remains BiPAP dependent Eyes - PERRL, EOM-I and  ENT - Bipap mask on, somnolent but protecting her airway Cardiac - RRR, Nl S1/S2 and -M/R/G Chest - Diffuse crackles, L>R Abdomen - Soft, NT, ND and +BS GU - no lesions noted Extremities - 1+ edema Skin - multiple areas of ecchymosis Lymphatics - no lymphadenopathy Neuro - Opens eyes and following some commands but clearly more lethargic and is in respiratory distress  Resolved Hospital Problem list    Assessment &  Plan:  Sepsis from community acquired pneumonia. - She has hx of PCN allergy - D/C levofloxacin - Add zithromax - F/U on cultures - Continue IV fluids - Check cortisol >100, procalcitonin 11.85  Lactic acidosis. - Likely from sepsis and hypoxia - F/u lactic acid level  Acute on chronic hypoxic, hypercapnic respiratory failure. COPD and asthma with continue tobacco abuse. - Bipap - F/u CXR, ABG - Scheduled BDs - Continue singulair  Acute renal failure from meds, volume depletion, sepsis. Hyponatremia. - Hold outpt hyzaar - NS IV fluid at 75 ml/hr, CVP of 13 - F/u BMET  Elevated troponin suspect from demand ischemia. Hx of hypertension. - Monitor on tele - F/u Echo, cardiac enzymes - Cards consult called - Neo for BP support given cardioversion overnight  Acute metabolic encephalopathy. Hx of depression. - Fentanyl drip - Hold outpt klonopin, paxil, trazodone - Severe anxiety, will need to add back when more stable from a respiratory standpoint  Hypoglycemia. - TF per nutrition - F/u blood sugar on BMET for now  Disposition / Summary of Today's Plan 11/24/17   Intubate today, consult nutrition for TF, Abx adjusted as above.    Diet: NPO, sips with meds DVT prophylaxis: SQ heparin GI prophylaxis: Not indicated Mobility: Bed rest Code Status: Full code Family Communication: No family at bedside  Labs   CBC: Recent Labs  Lab 11/24/2017 0710 11/02/2017 0721 11/24/17 0558  WBC 39.8*  --  46.2*  NEUTROABS 38.2*  --   --  HGB 16.4* 19.0* 14.9  HCT 48.0* 56.0* 42.0  MCV 109.3*  --  102.4*  PLT 293  --  128   Basic Metabolic Panel: Recent Labs  Lab 11/26/2017 0710 11/08/2017 0721 11/24/2017 1129 11/29/2017 1714 11/06/2017 2017 11/04/2017 2322 11/24/17 0558  NA 127* 121* 127* 129*  --  128* 127*  K 4.2 3.9 3.2* 3.5  --  4.0 4.6  CL 75* 80* 76* 83*  --  85* 85*  CO2 24  --  30 25  --  26 24  GLUCOSE 41* 46* 66* 58*  --  63* 68*  BUN 35* 43* 33* 36*  --  34* 38*    CREATININE 3.08* 3.00* 2.89* 2.72*  --  2.60* 2.83*  CALCIUM 8.7*  --  7.6* 7.5*  --  7.9* 7.9*  MG  --   --   --   --  1.1*  --   --    GFR: Estimated Creatinine Clearance: 19.6 mL/min (A) (by C-G formula based on SCr of 2.83 mg/dL (H)). Recent Labs  Lab 11/14/2017 0710 11/30/2017 0722 11/10/2017 1129 11/20/2017 1403 11/24/17 0558  PROCALCITON  --   --  11.85  --   --   WBC 39.8*  --   --   --  46.2*  LATICACIDVEN  --  10.41*  --  4.8*  --    Liver Function Tests: Recent Labs  Lab 11/12/2017 0710 11/24/17 0558  AST 95* 93*  ALT 32 43  ALKPHOS 136* 127*  BILITOT 2.4* 2.4*  PROT 6.2* 4.9*  ALBUMIN 2.7* 2.1*   No results for input(s): LIPASE, AMYLASE in the last 168 hours. No results for input(s): AMMONIA in the last 168 hours. ABG    Component Value Date/Time   PHART 7.304 (L) 11/24/2017 0637   PCO2ART 54.2 (H) 11/24/2017 0637   PO2ART 100.0 11/24/2017 0637   HCO3 26.9 11/24/2017 0637   TCO2 29 11/24/2017 0637   ACIDBASEDEF 1.0 11/24/2017 0637   O2SAT 97.0 11/24/2017 0637    Coagulation Profile: Recent Labs  Lab 11/10/2017 0710  INR 1.60   Cardiac Enzymes: Recent Labs  Lab 11/03/2017 0710 11/13/2017 1129 11/27/2017 1714 11/18/2017 2017  TROPONINI 1.03* 2.03* 3.32* 3.69*   HbA1C: No results found for: HGBA1C CBG: Recent Labs  Lab 11/10/2017 1505 11/01/2017 1908 11/16/2017 2332 11/24/17 0058 11/24/17 0325  GLUCAP 86 76 66* 125* 73    Admitting History of Present Illness.   62 yo female called EMS with shortness of breath.  Had cough, chest congestion, and lethargy.  Noted to be hypoxic.  Started on CPAP by EMS and then Bipap in ER.  Noted to have hypotension, acute renal failure, hyponatremia.  CXR showed lt upper lobe consolidation.  Started on ABx and IV fluids.  She is not able to provide much hx due to altered mental status.  The patient is critically ill with multiple organ systems failure and requires high complexity decision making for assessment and support,  frequent evaluation and titration of therapies, application of advanced monitoring technologies and extensive interpretation of multiple databases.   Critical Care Time devoted to patient care services described in this note is  34  Minutes. This time reflects time of care of this signee Dr [name]. This critical care time does not reflect procedure time, or teaching time or supervisory time of PA/NP/Med student/Med Resident etc but could involve care discussion time.  Rush Farmer, M.D. [facility] Pulmonary/Critical Care Medicine. Pager: [phone]. After hours  pager: [phone].

## 2017-11-24 NOTE — Procedures (Addendum)
Intubation Procedure Note DELMA DRONE [id number] November 15, 1955  Procedure: Intubation Indications: Respiratory insufficiency  Procedure Details Consent: Unable to obtain consent because of emergent medical necessity. Time Out: Verified patient identification, verified procedure, site/side was marked, verified correct patient position, special equipment/implants available, medications/allergies/relevent history reviewed, required imaging and test results available.  Performed  Maximum sterile technique was used including gloves, hand hygiene and mask.  MAC    Evaluation Hemodynamic Status: BP stable throughout; O2 sats: stable throughout Patient's Current Condition: stable Complications: No apparent complications Patient did tolerate procedure well. Chest X-ray ordered to verify placement.  CXR: pending.  Done with Noe Gens NP  [name] 11/24/2017

## 2017-11-24 NOTE — Procedures (Signed)
OGT Placement By MD  Placed under direct laryngoscopy.  Confirmed visually and with coffee ground material coming from it   Rush Farmer, M.D. [facility] Pulmonary/Critical Care Medicine. Pager: [phone]. After hours pager: [phone].

## 2017-11-24 NOTE — Progress Notes (Signed)
[place] for Heparin Indication: chest pain/ACS, afib  Allergies  Allergen Reactions  . Penicillins Itching    Details unknown    Patient Measurements: Height: 5\' 3"  (160 cm) Weight: 146 lb 2.6 oz (66.3 kg) IBW/kg (Calculated) : 52.4 Heparin Dosing Weight: 66 kg  Vital Signs: Temp: 98.8 F (37.1 C) (09/24 0327) Temp Source: Axillary (09/24 0327) BP: 95/57 (09/24 0338) Pulse Rate: 94 (09/24 0500)  Labs: Recent Labs    11/16/2017 0710 11/18/2017 0721 11/11/2017 1129 11/05/2017 1714 11/15/2017 2017 11/09/2017 2322 11/24/17 0558  HGB 16.4* 19.0*  --   --   --   --   --   HCT 48.0* 56.0*  --   --   --   --   --   PLT 293  --   --   --   --   --   --   LABPROT 18.9*  --   --   --   --   --   --   INR 1.60  --   --   --   --   --   --   HEPARINUNFRC  --   --   --   --   --   --  0.13*  CREATININE 3.08* 3.00* 2.89* 2.72*  --  2.60*  --   TROPONINI 1.03*  --  2.03* 3.32* 3.69*  --   --     Estimated Creatinine Clearance: 21.3 mL/min (A) (by C-G formula based on SCr of 2.6 mg/dL (H)).   Medical History: No past medical history on file.  Assessment: 14 YOF with advanced COPD who presented on 9/23 with AMS, hypoxia, hypotension, and AKI. Now found to have positive troponins concerning for ACS and pharmacy consulted to start Heparin for anticoagulation.  She also required cardioversion for afib  Goal of Therapy:  Heparin level 0.3-0.7 units/ml Monitor platelets by anticoagulation protocol: Yes   Plan:  - Heparin 1500 unit bolus x 1 - Increase Heparin drip to 1000units/hr -Check heparin level 6 hours after rate change - Daily heparin level, CBC - Will continue to monitor for any signs/symptoms of bleeding  Thank you for allowing pharmacy to be a part of this patient's care.  Excell Seltzer, PharmD Clinical Pharmacist 11/24/2017 6:46 AM

## 2017-11-24 NOTE — Progress Notes (Addendum)
[place] for Heparin Indication: chest pain/ACS, afib  Allergies  Allergen Reactions  . Penicillins Itching    Details unknown    Patient Measurements: Height: 5\' 3"  (160 cm) Weight: 146 lb 2.6 oz (66.3 kg) IBW/kg (Calculated) : 52.4 Heparin Dosing Weight: 66 kg  Vital Signs: Temp: 98.9 F (37.2 C) (09/24 1126) Temp Source: Axillary (09/24 1126) BP: 68/36 (09/24 1531) Pulse Rate: 36 (09/24 0800)  Labs: Recent Labs    11/03/2017 0710 11/12/2017 0721 11/26/2017 1129 11/27/2017 1714 11/02/2017 2017 11/14/2017 2322 11/24/17 0558 11/24/17 1338  HGB 16.4* 19.0*  --   --   --   --  14.9  --   HCT 48.0* 56.0*  --   --   --   --  42.0  --   PLT 293  --   --   --   --   --  241  --   LABPROT 18.9*  --   --   --   --   --   --   --   INR 1.60  --   --   --   --   --   --   --   HEPARINUNFRC  --   --   --   --   --   --  0.13* 0.30  CREATININE 3.08* 3.00* 2.89* 2.72*  --  2.60* 2.83*  --   TROPONINI 1.03*  --  2.03* 3.32* 3.69*  --   --   --     Estimated Creatinine Clearance: 19.6 mL/min (A) (by C-G formula based on SCr of 2.83 mg/dL (H)).   Medical History: No past medical history on file.  Assessment: 81 YOF with advanced COPD who presented on 9/23 with AMS, hypoxia, hypotension, and AKI. Now found to have positive troponins concerning for ACS and pharmacy consulted to start Heparin for anticoagulation.  She also required cardioversion for afib  Heparin level at the lower end of therapeutic at 0.3. No bleeding noted.   Goal of Therapy:  Heparin level 0.3-0.7 units/ml Monitor platelets by anticoagulation protocol: Yes   Plan:  -Increase heparin IV to 1100 units/hr -Check 6h heparin level after rate change -Daily heparin level, CBC -Will continue to monitor for any signs/symptoms of bleeding  Thank you for allowing pharmacy to be a part of this patient's care.  Vertis Kelch, PharmD PGY1 Pharmacy Resident Phone [phone] 11/24/2017       4:29 PM

## 2017-11-25 ENCOUNTER — Inpatient Hospital Stay (HOSPITAL_COMMUNITY): Payer: PPO

## 2017-11-25 LAB — BASIC METABOLIC PANEL
ANION GAP: 19 — AB (ref 5–15)
BUN: 38 mg/dL — ABNORMAL HIGH (ref 6–20)
CHLORIDE: 89 mmol/L — AB (ref 98–111)
CO2: 22 mmol/L (ref 22–32)
Calcium: 7.4 mg/dL — ABNORMAL LOW (ref 8.9–10.3)
Creatinine, Ser: 2.36 mg/dL — ABNORMAL HIGH (ref 0.44–1.00)
GFR calc non Af Amer: 21 mL/min — ABNORMAL LOW (ref >60)
GFR, EST AFRICAN AMERICAN: 25 mL/min — AB (ref >60)
GLUCOSE: 194 mg/dL — AB (ref 70–99)
Potassium: 4.5 mmol/L (ref 3.5–5.1)
Sodium: 130 mmol/L — ABNORMAL LOW (ref 135–145)

## 2017-11-25 LAB — HEPARIN LEVEL (UNFRACTIONATED): HEPARIN UNFRACTIONATED: 0.51 [IU]/mL (ref 0.30–0.70)

## 2017-11-25 LAB — GLUCOSE, CAPILLARY
GLUCOSE-CAPILLARY: 58 mg/dL — AB (ref 70–99)
GLUCOSE-CAPILLARY: 206 mg/dL — AB (ref 70–99)
GLUCOSE-CAPILLARY: 160 mg/dL — AB (ref 70–99)
Glucose-Capillary: 179 mg/dL — ABNORMAL HIGH (ref 70–99)
Glucose-Capillary: 249 mg/dL — ABNORMAL HIGH (ref 70–99)
Glucose-Capillary: 256 mg/dL — ABNORMAL HIGH (ref 70–99)
Glucose-Capillary: 188 mg/dL — ABNORMAL HIGH (ref 70–99)

## 2017-11-25 LAB — PHOSPHORUS: Phosphorus: 5.1 mg/dL — ABNORMAL HIGH (ref 2.5–4.6)

## 2017-11-25 LAB — CBC
HEMATOCRIT: 39.7 % (ref 36.0–46.0)
HEMOGLOBIN: 14.2 g/dL (ref 12.0–15.0)
MCH: 36 pg — ABNORMAL HIGH (ref 26.0–34.0)
MCHC: 35.8 g/dL (ref 30.0–36.0)
MCV: 100.8 fL — AB (ref 78.0–100.0)
Platelets: 175 10*3/uL (ref 150–400)
RBC: 3.94 MIL/uL (ref 3.87–5.11)
RDW: 16.4 % — AB (ref 11.5–15.5)
WBC: 33.2 10*3/uL — ABNORMAL HIGH (ref 4.0–10.5)

## 2017-11-25 LAB — BLOOD GAS, ARTERIAL
ACID-BASE EXCESS: 0.5 mmol/L (ref 0.0–2.0)
BICARBONATE: 25 mmol/L (ref 20.0–28.0)
Drawn by: 347621
FIO2: 40
LHR: 30 {breaths}/min
O2 SAT: 98.8 %
PCO2 ART: 46.3 mmHg (ref 32.0–48.0)
PEEP/CPAP: 5 cmH2O
PH ART: 7.359 (ref 7.350–7.450)
Patient temperature: 100.9
VT: 420 mL
pO2, Arterial: 146 mmHg — ABNORMAL HIGH (ref 83.0–108.0)

## 2017-11-25 LAB — MAGNESIUM: Magnesium: 1.6 mg/dL — ABNORMAL LOW (ref 1.7–2.4)

## 2017-11-25 LAB — T4, FREE: FREE T4: 0.57 ng/dL — AB (ref 0.82–1.77)

## 2017-11-25 MED ORDER — MAGNESIUM SULFATE 2 GM/50ML IV SOLN
2.0000 g | Freq: Once | INTRAVENOUS | Status: AC
  Administered 2017-11-25: 2 g via INTRAVENOUS
  Filled 2017-11-25: qty 50

## 2017-11-25 MED ORDER — ACETAMINOPHEN 650 MG RE SUPP: 650.0000 mg | RECTAL | Status: DC | PRN

## 2017-11-25 MED ORDER — DEXTROSE 50 % IV SOLN
1.0000 | Freq: Once | INTRAVENOUS | Status: AC
  Administered 2017-11-25: 50 mL via INTRAVENOUS

## 2017-11-25 MED ORDER — MONTELUKAST SODIUM 10 MG PO TABS
10.0000 mg | ORAL_TABLET | Freq: Every day | ORAL | Status: DC
  Administered 2017-11-25 – 2017-11-27 (×3): 10 mg
  Filled 2017-11-25 (×3): qty 1

## 2017-11-25 MED ORDER — SENNOSIDES-DOCUSATE SODIUM 8.6-50 MG PO TABS
1.0000 | ORAL_TABLET | Freq: Every day | ORAL | Status: DC | PRN
  Filled 2017-11-25: qty 1

## 2017-11-25 MED ORDER — DEXTROSE 50 % IV SOLN
INTRAVENOUS | Status: AC
  Filled 2017-11-25: qty 50

## 2017-11-25 MED ORDER — FENTANYL CITRATE (PF) 100 MCG/2ML IJ SOLN
50.0000 ug | INTRAMUSCULAR | Status: DC | PRN
  Administered 2017-11-25 – 2017-11-26 (×3): 50 ug via INTRAVENOUS
  Administered 2017-11-26: 100 ug via INTRAVENOUS
  Administered 2017-11-26: 50 ug via INTRAVENOUS
  Administered 2017-11-26 (×3): 100 ug via INTRAVENOUS
  Filled 2017-11-25 (×8): qty 2

## 2017-11-25 MED ORDER — SENNOSIDES-DOCUSATE SODIUM 8.6-50 MG PO TABS
1.0000 | ORAL_TABLET | Freq: Every day | ORAL | Status: DC | PRN
  Administered 2017-11-26: 1
  Filled 2017-11-25 (×2): qty 1

## 2017-11-25 MED ORDER — IBUPROFEN 100 MG/5ML PO SUSP
800.0000 mg | Freq: Three times a day (TID) | ORAL | Status: DC | PRN
  Filled 2017-11-25: qty 40

## 2017-11-25 MED ORDER — PANTOPRAZOLE SODIUM 40 MG PO PACK
40.0000 mg | PACK | Freq: Every day | ORAL | Status: DC
  Administered 2017-11-26 – 2017-11-27 (×2): 40 mg
  Filled 2017-11-25 (×3): qty 20

## 2017-11-25 MED ORDER — PANTOPRAZOLE SODIUM 40 MG PO TBEC: 40.0000 mg | DELAYED_RELEASE_TABLET | Freq: Every day | ORAL | Status: DC

## 2017-11-25 MED ORDER — SODIUM CHLORIDE 0.9 % IV SOLN
2.0000 g | INTRAVENOUS | Status: DC
  Administered 2017-11-25 – 2017-11-27 (×3): 2 g via INTRAVENOUS
  Filled 2017-11-25 (×3): qty 20

## 2017-11-25 MED ORDER — ACETAMINOPHEN 325 MG PO TABS
650.0000 mg | ORAL_TABLET | Freq: Four times a day (QID) | ORAL | Status: DC | PRN
  Administered 2017-11-25 – 2017-11-26 (×2): 650 mg
  Filled 2017-11-25 (×3): qty 2

## 2017-11-25 NOTE — Progress Notes (Signed)
[facility] for Heparin Indication: chest pain/ACS, afib  Allergies  Allergen Reactions  . Penicillins Itching    Details unknown    Patient Measurements: Height: 5\' 3"  (160 cm) Weight: 146 lb 2.6 oz (66.3 kg) IBW/kg (Calculated) : 52.4 Heparin Dosing Weight: 66 kg  Vital Signs: Temp: 103.2 F (39.6 C) (09/24 2344) Temp Source: Oral (09/24 2344) BP: 140/94 (09/25 0600) Pulse Rate: 90 (09/25 0600)  Labs: Recent Labs    11/03/2017 0710 11/06/2017 0721 11/27/2017 1129 11/06/2017 1714 11/12/2017 2017 11/13/2017 2322  11/24/17 0558 11/24/17 1338 11/24/17 2306 11/25/17 0358  HGB 16.4* 19.0*  --   --   --   --   --  14.9  --   --  14.2  HCT 48.0* 56.0*  --   --   --   --   --  42.0  --   --  39.7  PLT 293  --   --   --   --   --   --  241  --   --  175  LABPROT 18.9*  --   --   --   --   --   --   --   --   --   --   INR 1.60  --   --   --   --   --   --   --   --   --   --   HEPARINUNFRC  --   --   --   --   --   --    < > 0.13* 0.30 0.29* 0.51  CREATININE 3.08* 3.00* 2.89* 2.72*  --  2.60*  --  2.83*  --   --  2.36*  TROPONINI 1.03*  --  2.03* 3.32* 3.69*  --   --   --   --   --   --    < > = values in this interval not displayed.    Estimated Creatinine Clearance: 23.5 mL/min (A) (by C-G formula based on SCr of 2.36 mg/dL (H)).   Medical History: Past Medical History:  Diagnosis Date  . COPD (chronic obstructive pulmonary disease) (HCC)     Assessment: 41 YOF with advanced COPD who presented on 9/23 with AMS, hypoxia, hypotension, and AKI. Now found to have positive troponins concerning for ACS and pharmacy consulted to start Heparin for anticoagulation.  She also required cardioversion for afib  Heparin level therapeutic at 0.51. CBC stable - No issues reported with bleeding.   Goal of Therapy:  Heparin level 0.3-0.7 units/ml Monitor platelets by anticoagulation protocol: Yes   Plan:  Continue heparin IV at 1250 units/hr Daily  heparin level and CBC Monitor for signs/symptoms of bleeding  Vertis Kelch, PharmD PGY1 Pharmacy Resident Phone [phone] 11/25/2017       6:48 AM

## 2017-11-25 NOTE — Progress Notes (Signed)
Progress Note  Patient Name: Jenny Branch Date of Encounter: 11/25/2017  Primary Cardiologist: No primary care provider on file.   Subjective   Intubated, sedated, no hx obtainable. No family at bedside today.  Inpatient Medications    Scheduled Meds: . chlorhexidine gluconate (MEDLINE KIT)  15 mL Mouth Rinse BID  . hydrocortisone sodium succinate  50 mg Intravenous Q6H  . ipratropium-albuterol  3 mL Nebulization Q6H  . mouth rinse  15 mL Mouth Rinse 10 times per day  . montelukast  10 mg Oral QHS  . pantoprazole  40 mg Oral Daily   Continuous Infusions: . sodium chloride    . sodium chloride    . angiotensin II (GIAPREZA) infusion 40 ng/kg/min (11/25/17 1100)  . azithromycin    . dexmedetomidine (PRECEDEX) IV infusion 1.2 mcg/kg/hr (11/25/17 1100)  . dextrose 5 % and 0.9% NaCl Stopped (11/25/17 1125)  . fentaNYL infusion INTRAVENOUS    . heparin 1,250 Units/hr (11/25/17 1100)  . meropenem (MERREM) IV Stopped (11/25/17 0944)  . norepinephrine (LEVOPHED) Adult infusion Stopped (11/25/17 1041)  . phenylephrine (NEO-SYNEPHRINE) Adult infusion Stopped (11/24/17 1654)  . vasopressin (PITRESSIN) infusion - *FOR SHOCK* 0.03 Units/min (11/25/17 1100)   PRN Meds: Place/Maintain arterial line **AND** sodium chloride, Place/Maintain arterial line **AND** sodium chloride, acetaminophen **OR** acetaminophen, albuterol, fentaNYL, fentaNYL (SUBLIMAZE) injection, ibuprofen   Vital Signs    Vitals:   11/25/17 1015 11/25/17 1045 11/25/17 1100 11/25/17 1115  BP:   114/79   Pulse: 81 87 85   Resp: (!) 30 (!) 30 (!) 30 (!) 27  Temp:    (!) 100.8 F (38.2 C)  TempSrc:    Axillary  SpO2: 93% 98% 97%   Weight:      Height:        Intake/Output Summary (Last 24 hours) at 11/25/2017 1157 Last data filed at 11/25/2017 1100 Gross per 24 hour  Intake 5185.93 ml  Output 3405 ml  Net 1780.93 ml   Filed Weights   11/26/2017 1010  Weight: 66.3 kg    Telemetry    NSR without  arrhythmia - Personally Reviewed   Physical Exam  Intubated, sedated GEN: No acute distress.   Neck: No JVD Cardiac: RRR, no murmurs, rubs, or gallops.  Respiratory: coarse breath sounds bilaterally. GI: Soft, no masses, non-distended  MS: No edema; No deformity. Neuro:  unresponsive  Labs    Chemistry Recent Labs  Lab 11/18/2017 0710  11/20/2017 2322 11/24/17 0558 11/25/17 0358  NA 127*   < > 128* 127* 130*  K 4.2   < > 4.0 4.6 4.5  CL 75*   < > 85* 85* 89*  CO2 24   < > '26 24 22  ' GLUCOSE 41*   < > 63* 68* 194*  BUN 35*   < > 34* 38* 38*  CREATININE 3.08*   < > 2.60* 2.83* 2.36*  CALCIUM 8.7*   < > 7.9* 7.9* 7.4*  PROT 6.2*  --   --  4.9*  --   ALBUMIN 2.7*  --   --  2.1*  --   AST 95*  --   --  93*  --   ALT 32  --   --  43  --   ALKPHOS 136*  --   --  127*  --   BILITOT 2.4*  --   --  2.4*  --   GFRNONAA 16*   < > 19* 17* 21*  GFRAA 18*   < >  22* 20* 25*  ANIONGAP 28*   < > 17* 18* 19*   < > = values in this interval not displayed.     Hematology Recent Labs  Lab 11/07/2017 0710 11/22/2017 0721 11/24/17 0558 11/25/17 0358  WBC 39.8*  --  46.2* 33.2*  RBC 4.39  --  4.10 3.94  HGB 16.4* 19.0* 14.9 14.2  HCT 48.0* 56.0* 42.0 39.7  MCV 109.3*  --  102.4* 100.8*  MCH 37.4*  --  36.3* 36.0*  MCHC 34.2  --  35.5 35.8  RDW 16.8*  --  16.3* 16.4*  PLT 293  --  241 175    Cardiac Enzymes Recent Labs  Lab 11/08/2017 0710 11/08/2017 1129 11/13/2017 1714 11/19/2017 2017  TROPONINI 1.03* 2.03* 3.32* 3.69*    Recent Labs  Lab 11/26/2017 0720  TROPIPOC 1.12*     BNP Recent Labs  Lab 11/06/2017 0710  BNP 2,852.4*     DDimer No results for input(s): DDIMER in the last 168 hours.   Radiology    Dg Chest Port 1 View  Result Date: 11/25/2017 CLINICAL DATA:  Hypoxia EXAM: PORTABLE CHEST 1 VIEW COMPARISON:  November 24, 2017 FINDINGS: Endotracheal tube tip is 3.5 cm above the carina. Central catheter tip is in the superior vena cava. Nasogastric tube tip and side port  are below the diaphragm. No pneumothorax. There is airspace consolidation throughout the left upper lobe with volume loss. Lungs elsewhere are clear. Heart size and pulmonary vascularity are normal. No adenopathy is appreciable. No bone lesions. IMPRESSION: Tube and catheter positions as described without pneumothorax. Extensive airspace consolidation consistent with pneumonia left upper lobe. Lungs elsewhere clear. Stable cardiac silhouette. Electronically Signed   By: Lowella Grip III M.D.   On: 11/25/2017 10:28   Dg Chest Port 1 View  Result Date: 11/24/2017 CLINICAL DATA:  Status post placement of endotracheal and orogastric tubes. EXAM: PORTABLE CHEST 1 VIEW COMPARISON:  Portable chest x-ray of earlier today. FINDINGS: The endotracheal tube tip projects 3.9 cm above the carina. The esophagogastric tube tip and proximal port project below the inferior margin of the image. There is moderate gaseous distention of what appears to be the stomach. The cardiopericardial silhouette remains enlarged. There is dense infiltrate in the left upper lobe. The right lung is grossly clear. IMPRESSION: Reasonable positioning of the endotracheal and esophagogastric tubes. Electronically Signed   By: David  Martinique M.D.   On: 11/24/2017 12:35   Dg Chest Port 1 View  Result Date: 11/24/2017 CLINICAL DATA:  Community acquired pneumonia. EXAM: PORTABLE CHEST 1 VIEW COMPARISON:  11/22/2017. FINDINGS: Left subclavian line noted in stable position. Stable cardiomegaly. Dense left upper lung infiltrate/consolidation without interval change from prior exam. IMPRESSION: 1.  Left subclavian line stable position. 2. Dense left upper lung infiltrate/consolidation without interval change. Followup PA and lateral chest X-ray is recommended in 3-4 weeks following trial of antibiotic therapy to ensure resolution and exclude underlying malignancy. Electronically Signed   By: Marcello Moores  Register   On: 11/24/2017 10:02   Dg Chest Port 1  View  Result Date: 11/14/2017 CLINICAL DATA:  62 year old female with central line placement. EXAM: PORTABLE CHEST 1 VIEW COMPARISON:  Chest radiograph dated 11/03/2017 FINDINGS: Left subclavian central venous catheter with tip over central SVC. No pneumothorax. Consolidative changes of the left upper lobe with progression of the airspace disease in the left mid to lower lung field since the earlier radiograph. There is no pleural effusion top-normal cardiac size. No acute osseous pathology.  IMPRESSION: 1. Left subclavian central line with tip over central SVC. No pneumothorax. 2. Interval progression of the left lung opacities. Electronically Signed   By: Anner Crete M.D.   On: 11/16/2017 22:13    Cardiac Studies   2D Echo: Study Conclusions  - Left ventricle: The cavity size was normal. Wall thickness was   increased in a pattern of mild LVH. Systolic function was normal.   The estimated ejection fraction was in the range of 60% to 65%.   Doppler parameters are consistent with abnormal left ventricular   relaxation (grade 1 diastolic dysfunction). - Right ventricle: The cavity size was moderately to severely   dilated. Systolic function was moderately reduced. - Pericardium, extracardiac: A small pericardial effusion was   identified. There was no evidence of hemodynamic compromise.  Impressions:  - Technically difficult images with very poor image quality   The LV function appears to be well preserved.   The RV is dilated and hypocontractile.   Consider pulmonary embolus  Patient Profile     62 y.o. female with septic shock, paroxysmal atrial fib with RVR, elevated troponin suspected demand ischemia  Assessment & Plan    1. AFib with RVR: maintaining sinus rhythm. Currently on heparin 2. Acute diastolic CHF: continue supportive care, inotropic Rx 3. Demand ischemia: troponin elevation noted in this critically ill patient. Normal LV function by echo. Low suspicion for  ACS. 4. AKI: stable renal function  No acute cardiac interventions indicated at this time. Will follow with you.     For questions or updates, please contact [place] Please consult [email or web address] for contact info under        Signed, Sherren Mocha, MD  11/25/2017, 11:57 AM

## 2017-11-25 NOTE — Progress Notes (Signed)
NAME:  Jenny Branch, MRN:  [id number], DOB:  01-Feb-1956, LOS: 2 ADMISSION DATE:  11/27/2017, CONSULTATION DATE:  11/23/2008 REFERRING MD:  Dr Angela Adam, ER CHIEF COMPLAINT:  Short of breath   Brief History   62 yo female smoker with altered mental status, hypoxia/hypercapnia, hypotension, hyponatremia, acute renal failure.  Found to have Lt upper lobe consolidation on CXR.  Followed by Dr. Lake Bells in pulmonary office for advanced COPD.  Past Medical History Hypertension, PTSD, Depression  Significant Hospital Events   9/23 Admit 9/24 Intubated  Consults: date of consult/date signed off & final recs:  Cardiology 11/25/17  Procedures (surgical and bedside):    Significant Diagnostic Tests:   Micro Data: Blood 9/23 >> Sputum 9/23 >> HIV 9/23 >> Legionella 9/23 >> Pneumococcal 9/23 >>  Antimicrobials:  Levaquin 9/23 >> 9/24 Merrem 9/24>>> Vancomycin 9/23 >> 9/23 Zithromax 9/24 >>  Subjective:  Remains on the vent, sedated  Objective   Blood pressure 115/79, pulse 81, temperature 98.6 F (37 C), temperature source Oral, resp. rate (!) 30, height 5\' 3"  (1.6 m), weight 66.3 kg, SpO2 93 %. CVP:  [13 mmHg-14 mmHg] 14 mmHg  Vent Mode: PRVC FiO2 (%):  [40 %-100 %] 40 % Set Rate:  [18 bmp-30 bmp] 30 bmp Vt Set:  [420 mL] 420 mL PEEP:  [5 cmH20] 5 cmH20 Plateau Pressure:  [17 cmH20-18 cmH20] 17 cmH20   Intake/Output Summary (Last 24 hours) at 11/25/2017 1035 Last data filed at 11/25/2017 0600 Gross per 24 hour  Intake 4248.54 ml  Output 2605 ml  Net 1643.54 ml   Filed Weights   11/09/2017 1010  Weight: 66.3 kg    Examination: Gen:      No acute distress HEENT:  EOMI, sclera anicteric, ETT Neck:     No masses; no thyromegaly Lungs:    Clear to auscultation bilaterally; normal respiratory effort CV:         Regular rate and rhythm; no murmurs Abd:      + bowel sounds; soft, non-tender; no palpable masses, no distension Ext:    No edema; adequate peripheral  perfusion Skin:      Warm and dry; no rash Neuro: alert and oriented x 3 Psych: normal mood and affect  Resolved Hospital Problem list    Assessment & Plan:  Sepsis from community acquired pneumonia. - She has hx of PCN allergy Continue meropenem, Zithromax Follow cultures Stress dose steroids  Lactic acidosis. - Likely from sepsis and hypoxia Follow Lactic acid  Acute on chronic hypoxic, hypercapnic respiratory failure. COPD and asthma with continue tobacco abuse. Continue full vent support Follow chest x-ray, ABG Scheduled bronchodilators  Acute renal failure from meds, volume depletion, sepsis. Hyponatremia. Holding outpatient blood pressure medication  Elevated troponin suspect from demand ischemia. Hx of hypertension. Telemetry monitoring Cardiology consulted Continue Giapressa, Levophed, vasopressin.  Coffee ground emesis noted during intubation Stop heparin drip.  PPI Follow H/H Keep NPO  Acute metabolic encephalopathy. Hx of depression. Fentanyl drip Hold outpt klonopin, paxil, trazodone Severe anxiety, will need to add back when more stable from a respiratory standpoint  Hypoglycemia. D5 NS  Disposition / Summary of Today's Plan 11/25/17       Diet: NPO DVT prophylaxis: Hep gtt > hold GI prophylaxis: Protonix Mobility: Bed rest Code Status: Full code Family Communication: No family at bedside  Labs   CBC: Recent Labs  Lab 11/15/2017 0710 11/05/2017 0721 11/24/17 0558 11/25/17 0358  WBC 39.8*  --  46.2* 33.2*  NEUTROABS  38.2*  --   --   --   HGB 16.4* 19.0* 14.9 14.2  HCT 48.0* 56.0* 42.0 39.7  MCV 109.3*  --  102.4* 100.8*  PLT 293  --  241 322   Basic Metabolic Panel: Recent Labs  Lab 11/27/2017 1129 11/13/2017 1714 11/27/2017 2017 11/29/2017 2322 11/24/17 0558 11/25/17 0358  NA 127* 129*  --  128* 127* 130*  K 3.2* 3.5  --  4.0 4.6 4.5  CL 76* 83*  --  85* 85* 89*  CO2 30 25  --  26 24 22   GLUCOSE 66* 58*  --  63* 68* 194*  BUN  33* 36*  --  34* 38* 38*  CREATININE 2.89* 2.72*  --  2.60* 2.83* 2.36*  CALCIUM 7.6* 7.5*  --  7.9* 7.9* 7.4*  MG  --   --  1.1*  --   --  1.6*  PHOS  --   --   --   --   --  5.1*   GFR: Estimated Creatinine Clearance: 23.5 mL/min (A) (by C-G formula based on SCr of 2.36 mg/dL (H)). Recent Labs  Lab 11/19/2017 0710 11/19/2017 0722 11/22/2017 1129 11/12/2017 1403 11/24/17 0558 11/25/17 0358  PROCALCITON  --   --  11.85  --   --   --   WBC 39.8*  --   --   --  46.2* 33.2*  LATICACIDVEN  --  10.41*  --  4.8*  --   --    Liver Function Tests: Recent Labs  Lab 11/25/2017 0710 11/24/17 0558  AST 95* 93*  ALT 32 43  ALKPHOS 136* 127*  BILITOT 2.4* 2.4*  PROT 6.2* 4.9*  ALBUMIN 2.7* 2.1*   No results for input(s): LIPASE, AMYLASE in the last 168 hours. No results for input(s): AMMONIA in the last 168 hours. ABG    Component Value Date/Time   PHART 7.359 11/25/2017 0350   PCO2ART 46.3 11/25/2017 0350   PO2ART 146 (H) 11/25/2017 0350   HCO3 25.0 11/25/2017 0350   TCO2 28 11/24/2017 1703   ACIDBASEDEF 1.0 11/24/2017 1703   O2SAT 98.8 11/25/2017 0350    Coagulation Profile: Recent Labs  Lab 11/11/2017 0710  INR 1.60   Cardiac Enzymes: Recent Labs  Lab 11/14/2017 0710 11/27/2017 1129 11/27/2017 1714 11/27/2017 2017  TROPONINI 1.03* 2.03* 3.32* 3.69*   HbA1C: No results found for: HGBA1C CBG: Recent Labs  Lab 11/24/17 1957 11/24/17 2337 11/25/17 0453 11/25/17 0729 11/25/17 0811  GLUCAP 133* 86 179* 58* 249*    Admitting History of Present Illness.   62 yo female called EMS with shortness of breath.  Had cough, chest congestion, and lethargy.  Noted to be hypoxic.  Started on CPAP by EMS and then Bipap in ER.   Intubated 9/25 > multiorgan failure Continue supportive care  The patient is critically ill with multiple organ system failure and requires high complexity decision making for assessment and support, frequent evaluation and titration of therapies, advanced  monitoring, review of radiographic studies and interpretation of complex data.   Critical Care Time devoted to patient care services, exclusive of separately billable procedures, described in this note is 35 minutes.   Marshell Garfinkel MD [facility] 11/25/2017, 12:53 PM

## 2017-11-26 ENCOUNTER — Inpatient Hospital Stay (HOSPITAL_COMMUNITY): Payer: PPO

## 2017-11-26 LAB — BLOOD GAS, ARTERIAL
Acid-Base Excess: 1.6 mmol/L (ref 0.0–2.0)
Bicarbonate: 25.1 mmol/L (ref 20.0–28.0)
Drawn by: 252031
FIO2: 40
MECHVT: 420 mL
O2 SAT: 98.6 %
PATIENT TEMPERATURE: 98.6
PCO2 ART: 35.6 mmHg (ref 32.0–48.0)
PEEP: 5 cmH2O
PH ART: 7.462 — AB (ref 7.350–7.450)
PO2 ART: 122 mmHg — AB (ref 83.0–108.0)
RATE: 30 resp/min

## 2017-11-26 LAB — BASIC METABOLIC PANEL
Anion gap: 14 (ref 5–15)
BUN: 41 mg/dL — AB (ref 6–20)
CHLORIDE: 96 mmol/L — AB (ref 98–111)
CO2: 23 mmol/L (ref 22–32)
Calcium: 7.3 mg/dL — ABNORMAL LOW (ref 8.9–10.3)
Creatinine, Ser: 1.8 mg/dL — ABNORMAL HIGH (ref 0.44–1.00)
GFR calc Af Amer: 34 mL/min — ABNORMAL LOW (ref >60)
GFR calc non Af Amer: 30 mL/min — ABNORMAL LOW (ref >60)
GLUCOSE: 160 mg/dL — AB (ref 70–99)
POTASSIUM: 3.7 mmol/L (ref 3.5–5.1)
SODIUM: 133 mmol/L — AB (ref 135–145)

## 2017-11-26 LAB — PHOSPHORUS
PHOSPHORUS: 5.2 mg/dL — AB (ref 2.5–4.6)
Phosphorus: 5.5 mg/dL — ABNORMAL HIGH (ref 2.5–4.6)

## 2017-11-26 LAB — GLUCOSE, CAPILLARY
GLUCOSE-CAPILLARY: 102 mg/dL — AB (ref 70–99)
GLUCOSE-CAPILLARY: 133 mg/dL — AB (ref 70–99)
GLUCOSE-CAPILLARY: 153 mg/dL — AB (ref 70–99)
Glucose-Capillary: 148 mg/dL — ABNORMAL HIGH (ref 70–99)
Glucose-Capillary: 159 mg/dL — ABNORMAL HIGH (ref 70–99)
Glucose-Capillary: 202 mg/dL — ABNORMAL HIGH (ref 70–99)
Glucose-Capillary: 87 mg/dL (ref 70–99)

## 2017-11-26 LAB — CBC
HEMATOCRIT: 36.8 % (ref 36.0–46.0)
HEMOGLOBIN: 13 g/dL (ref 12.0–15.0)
MCH: 35.4 pg — AB (ref 26.0–34.0)
MCHC: 35.3 g/dL (ref 30.0–36.0)
MCV: 100.3 fL — AB (ref 78.0–100.0)
Platelets: 78 10*3/uL — ABNORMAL LOW (ref 150–400)
RBC: 3.67 MIL/uL — AB (ref 3.87–5.11)
RDW: 16.8 % — ABNORMAL HIGH (ref 11.5–15.5)
WBC: 17.7 10*3/uL — ABNORMAL HIGH (ref 4.0–10.5)

## 2017-11-26 LAB — MAGNESIUM
Magnesium: 2 mg/dL (ref 1.7–2.4)
Magnesium: 2 mg/dL (ref 1.7–2.4)

## 2017-11-26 LAB — TROPONIN I: TROPONIN I: 1.37 ng/mL — AB (ref <0.03)

## 2017-11-26 LAB — LACTIC ACID, PLASMA: LACTIC ACID, VENOUS: 2.4 mmol/L — AB (ref 0.5–1.9)

## 2017-11-26 LAB — HEPARIN LEVEL (UNFRACTIONATED): Heparin Unfractionated: <0.1 IU/mL — ABNORMAL LOW (ref 0.30–0.70)

## 2017-11-26 MED ORDER — VITAL HIGH PROTEIN PO LIQD: 1000.0000 mL | ORAL | Status: DC

## 2017-11-26 MED ORDER — VITAL AF 1.2 CAL PO LIQD
1500.0000 mL | ORAL | Status: DC
  Administered 2017-11-26: 1500 mL
  Filled 2017-11-26 (×2): qty 1500

## 2017-11-26 MED ORDER — CHLORHEXIDINE GLUCONATE 0.12 % MT SOLN
OROMUCOSAL | Status: AC
  Filled 2017-11-26: qty 15

## 2017-11-26 MED ORDER — INSULIN ASPART 100 UNIT/ML ~~LOC~~ SOLN
0.0000 [IU] | SUBCUTANEOUS | Status: DC
  Administered 2017-11-26: 2 [IU] via SUBCUTANEOUS
  Administered 2017-11-26 – 2017-11-27 (×3): 3 [IU] via SUBCUTANEOUS

## 2017-11-26 NOTE — Progress Notes (Signed)
Initial Nutrition Assessment  DOCUMENTATION CODES:   Not applicable  INTERVENTION:   Tube Feeding:  Vital AF 1.2 @ 55 ml/hr Provides 99 g of protein, 1584 kcals and 1069 mL of free water Meets 100% estimated calorie and protein needs, 95% calorie needs   NUTRITION DIAGNOSIS:   Inadequate oral intake related to inability to eat as evidenced by NPO status.  Being addressed via TF   GOAL:   Patient will meet greater than or equal to 90% of their needs  Progressing  MONITOR:   TF tolerance, Vent status, Labs, Weight trends  REASON FOR ASSESSMENT:   Ventilator    ASSESSMENT:   62 yo female with PMH of tobacco abuse, advanced COPD, HTN, PTSD, and depression who was admitted on 9/23 with CAP, acute on chronic respiratory failure, acute renal failure. Required intubation on 9/24.  9/23 Admit 9/24 Intubation, started on vasopressin, levophed, giapreza 9/26 Off levophed and vasopressin, continues on giapreza  Patient is currently intubated on ventilator support, precedex for sedation MV: 8 L/min Temp (24hrs), Avg:99.3 F (37.4 C), Min:98.1 F (36.7 C), Max:100.9 F (38.3 C)  OG tube in place  Pt with significant edema, moderate to very deep pitting edema in all extremeties. Unsure of dry weight; weight on admission 66.3 kg, No weight since.   Labs: sodium 133, phosphorus 5.5, Creaitnine 1.80, BUN 41 Meds: reviewed  Diet Order:   Diet Order            Diet NPO time specified Except for: Sips with Meds  Diet effective now              EDUCATION NEEDS:   No education needs have been identified at this time  Skin:  Skin Assessment: Reviewed RN Assessment  Last BM:  no documented BM  Height:   Ht Readings from Last 1 Encounters:  11/10/2017 5\' 3"  (1.6 m)    Weight:   Wt Readings from Last 1 Encounters:  11/01/2017 66.3 kg    Ideal Body Weight:  52.3 kg  BMI:  Body mass index is 25.89 kg/m.  Estimated Nutritional Needs:   Kcal:  1661 kcals    Protein:  90-120 g  Fluid:  >/= 1.6 L  Kerman Passey MS, RD, LDN, CNSC [phone] Pager  [phone] Weekend/On-Call Pager

## 2017-11-26 NOTE — Progress Notes (Signed)
NAME:  Jenny Branch, MRN:  [id number], DOB:  10-Jun-1955, LOS: 3 ADMISSION DATE:  11/22/2017, CONSULTATION DATE:  11/23/2008 REFERRING MD:  Dr Angela Adam, ER CHIEF COMPLAINT:  Short of breath   Brief History   62 yo female smoker with altered mental status, hypoxia/hypercapnia, hypotension, hyponatremia, acute renal failure.  Found to have Lt upper lobe consolidation on CXR.  Followed by Dr. Lake Bells in pulmonary office for advanced COPD.  Past Medical History Hypertension, PTSD, Depression  Significant Hospital Events   9/23 Admit 9/24 Intubated, started on giapressa, levo, vaso 9/25 Abx narrowed to ceftriaxone for pneumococcus 9/26 Weaned off levo, vaso, continues on giapressa. Abx narrowed to ceftriaxone   Consults: date of consult/date signed off & final recs:  Cardiology 11/25/17 > Continue supportive care  Procedures (surgical and bedside):    Significant Diagnostic Tests:   Micro Data: Blood 9/23 >> Negative Sputum 9/23 >>  HIV 9/23 >> Non reactive Legionella 9/23 >> negative Pneumococcal 9/23 >> Positive  Antimicrobials:  Levaquin 9/23 >> 9/24 Merrem 9/24>>> 9/25 Vancomycin 9/23 >> 9/23 Zithromax 9/24 >> 9/25 Ceftriaxone 9/25>   Subjective:  Remains on vent sedated  Objective   Blood pressure 103/77, pulse 77, temperature (!) 100.9 F (38.3 C), temperature source Axillary, resp. rate (!) 30, height 5\' 3"  (1.6 m), weight 66.3 kg, SpO2 100 %.    Vent Mode: PRVC FiO2 (%):  [40 %] 40 % Set Rate:  [30 bmp] 30 bmp Vt Set:  [420 mL] 420 mL PEEP:  [5 cmH20] 5 cmH20 Plateau Pressure:  [13 cmH20-19 cmH20] 17 cmH20   Intake/Output Summary (Last 24 hours) at 11/26/2017 0915 Last data filed at 11/26/2017 0800 Gross per 24 hour  Intake 2113.24 ml  Output 2110 ml  Net 3.24 ml   Filed Weights   11/13/2017 1010  Weight: 66.3 kg   Examination: Gen:      No acute distress HEENT:  EOMI, sclera anicteric, ETT Neck:     No masses; no thyromegaly Lungs:    Clear to  auscultation bilaterally; normal respiratory effort CV:         Regular rate and rhythm; no murmurs Abd:      + bowel sounds; soft, non-tender; no palpable masses, no distension Ext:    No edema; adequate peripheral perfusion Skin:      Warm and dry; no rash Neuro: Sedated, unresponsive  Resolved Hospital Problem list   Hypoglycemia.  Assessment & Plan:  Sepsis from community acquired pneumonia, pneumococcus PNA She has hx of PCN allergy but tolerating ceftriaxone Follow final cultures  Septic schock Lactic acidosis. Likely from sepsis and hypoxia Follow Lactic acid Weaning off pressors   Acute on chronic hypoxic, hypercapnic respiratory failure. COPD and asthma with continue tobacco abuse. Continue full vent support Follow CXR,. ABG Scheduled bronchodilators  Acute renal failure from meds, volume depletion, sepsis. Hyponatremia. Holding outpatient blood pressure medication Monitor urine output and cr  Elevated troponin suspect from demand ischemia. Hx of hypertension. Heparin stopped Weaning off pressors  Coffee ground emesis noted during intubation Off heparin drip PPI Follow H/H Start tube feeds  Acute metabolic encephalopathy. Hx of depression. Fentanyl drip Hold outpt klonopin, paxil, trazodone Severe anxiety, will need to add back when more stable from a respiratory standpoint  Disposition / Summary of Today's Plan 11/26/17   Appears to be stabilizing.  For today we will wean off pressor Continue vent support    Diet: NPO DVT prophylaxis: Hep gtt > hold GI prophylaxis: Protonix  Mobility: Bed rest Code Status: Full code Family Communication: No family at bedside  Labs   CBC: Recent Labs  Lab 11/03/2017 0710 11/22/2017 0721 11/24/17 0558 11/25/17 0358 11/26/17 0403  WBC 39.8*  --  46.2* 33.2* 17.7*  NEUTROABS 38.2*  --   --   --   --   HGB 16.4* 19.0* 14.9 14.2 13.0  HCT 48.0* 56.0* 42.0 39.7 36.8  MCV 109.3*  --  102.4* 100.8* 100.3*  PLT  293  --  241 175 78*   Basic Metabolic Panel: Recent Labs  Lab 11/13/2017 1714 11/10/2017 2017 11/16/2017 2322 11/24/17 0558 11/25/17 0358 11/26/17 0403  NA 129*  --  128* 127* 130* 133*  K 3.5  --  4.0 4.6 4.5 3.7  CL 83*  --  85* 85* 89* 96*  CO2 25  --  26 24 22 23   GLUCOSE 58*  --  63* 68* 194* 160*  BUN 36*  --  34* 38* 38* 41*  CREATININE 2.72*  --  2.60* 2.83* 2.36* 1.80*  CALCIUM 7.5*  --  7.9* 7.9* 7.4* 7.3*  MG  --  1.1*  --   --  1.6* 2.0  PHOS  --   --   --   --  5.1* 5.5*   GFR: Estimated Creatinine Clearance: 30.8 mL/min (A) (by C-G formula based on SCr of 1.8 mg/dL (H)). Recent Labs  Lab 11/16/2017 0710 11/29/2017 0722 11/01/2017 1129 11/18/2017 1403 11/24/17 0558 11/25/17 0358 11/26/17 0403  PROCALCITON  --   --  11.85  --   --   --   --   WBC 39.8*  --   --   --  46.2* 33.2* 17.7*  LATICACIDVEN  --  10.41*  --  4.8*  --   --   --    Liver Function Tests: Recent Labs  Lab 11/25/2017 0710 11/24/17 0558  AST 95* 93*  ALT 32 43  ALKPHOS 136* 127*  BILITOT 2.4* 2.4*  PROT 6.2* 4.9*  ALBUMIN 2.7* 2.1*   No results for input(s): LIPASE, AMYLASE in the last 168 hours. No results for input(s): AMMONIA in the last 168 hours. ABG    Component Value Date/Time   PHART 7.462 (H) 11/26/2017 0350   PCO2ART 35.6 11/26/2017 0350   PO2ART 122 (H) 11/26/2017 0350   HCO3 25.1 11/26/2017 0350   TCO2 28 11/24/2017 1703   ACIDBASEDEF 1.0 11/24/2017 1703   O2SAT 98.6 11/26/2017 0350    Coagulation Profile: Recent Labs  Lab 11/29/2017 0710  INR 1.60   Cardiac Enzymes: Recent Labs  Lab 11/09/2017 0710 11/08/2017 1129 11/14/2017 1714 11/12/2017 2017  TROPONINI 1.03* 2.03* 3.32* 3.69*   HbA1C: No results found for: HGBA1C CBG: Recent Labs  Lab 11/25/17 1501 11/25/17 2002 11/25/17 2359 11/26/17 0415 11/26/17 0725  GLUCAP 188* 160* 159* 148* 202*    Admitting History of Present Illness.   62 yo female called EMS with shortness of breath.  Had cough, chest  congestion, and lethargy.  Noted to be hypoxic.  Started on CPAP by EMS and then Bipap in ER.   Intubated 9/25 > multiorgan failure Continue supportive care  The patient is critically ill with multiple organ system failure and requires high complexity decision making for assessment and support, frequent evaluation and titration of therapies, advanced monitoring, review of radiographic studies and interpretation of complex data.   Critical Care Time devoted to patient care services, exclusive of separately billable procedures, described in this note is  35 minutes.   Marshell Garfinkel MD [facility] 11/26/2017, 9:15 AM

## 2017-11-26 NOTE — Progress Notes (Signed)
Results for LUZ, MARES (MRN [id number]) as of 11/26/2017 12:23  Ref. Range 11/26/2017 10:07  Troponin I Latest Ref Range: <0.03 ng/mL 1.37 (HH)  Lactic Acid, Venous Latest Ref Range: 0.5 - 1.9 mmol/L 2.4 [facility])  Provider notified.

## 2017-11-27 ENCOUNTER — Inpatient Hospital Stay (HOSPITAL_COMMUNITY): Payer: PPO

## 2017-11-27 LAB — CBC
HEMATOCRIT: 36.7 % (ref 36.0–46.0)
Hemoglobin: 12.8 g/dL (ref 12.0–15.0)
MCH: 35.7 pg — ABNORMAL HIGH (ref 26.0–34.0)
MCHC: 34.9 g/dL (ref 30.0–36.0)
MCV: 102.2 fL — ABNORMAL HIGH (ref 78.0–100.0)
Platelets: 41 10*3/uL — ABNORMAL LOW (ref 150–400)
RBC: 3.59 MIL/uL — ABNORMAL LOW (ref 3.87–5.11)
RDW: 17 % — AB (ref 11.5–15.5)
WBC: 9.1 10*3/uL (ref 4.0–10.5)

## 2017-11-27 LAB — POCT I-STAT 3, ART BLOOD GAS (G3+)
Acid-Base Excess: 3 mmol/L — ABNORMAL HIGH (ref 0.0–2.0)
Bicarbonate: 33 mmol/L — ABNORMAL HIGH (ref 20.0–28.0)
O2 Saturation: 98 %
PCO2 ART: 75.1 mmHg — AB (ref 32.0–48.0)
PH ART: 7.25 — AB (ref 7.350–7.450)
PO2 ART: 117 mmHg — AB (ref 83.0–108.0)
Patient temperature: 98.3
TCO2: 35 mmol/L — ABNORMAL HIGH (ref 22–32)

## 2017-11-27 LAB — GLUCOSE, CAPILLARY
GLUCOSE-CAPILLARY: 172 mg/dL — AB (ref 70–99)
GLUCOSE-CAPILLARY: 102 mg/dL — AB (ref 70–99)
Glucose-Capillary: 152 mg/dL — ABNORMAL HIGH (ref 70–99)
Glucose-Capillary: 108 mg/dL — ABNORMAL HIGH (ref 70–99)
Glucose-Capillary: 102 mg/dL — ABNORMAL HIGH (ref 70–99)

## 2017-11-27 LAB — BASIC METABOLIC PANEL
Anion gap: 12 (ref 5–15)
BUN: 45 mg/dL — AB (ref 6–20)
CALCIUM: 7.6 mg/dL — AB (ref 8.9–10.3)
CO2: 26 mmol/L (ref 22–32)
CREATININE: 1.35 mg/dL — AB (ref 0.44–1.00)
Chloride: 99 mmol/L (ref 98–111)
GFR calc non Af Amer: 42 mL/min — ABNORMAL LOW (ref >60)
GFR, EST AFRICAN AMERICAN: 49 mL/min — AB (ref >60)
Glucose, Bld: 177 mg/dL — ABNORMAL HIGH (ref 70–99)
Potassium: 2.7 mmol/L — CL (ref 3.5–5.1)
Sodium: 137 mmol/L (ref 135–145)

## 2017-11-27 LAB — MAGNESIUM
MAGNESIUM: 1.9 mg/dL (ref 1.7–2.4)
MAGNESIUM: 1.6 mg/dL — AB (ref 1.7–2.4)

## 2017-11-27 LAB — TROPONIN I: TROPONIN I: 1 ng/mL — AB (ref <0.03)

## 2017-11-27 LAB — LACTIC ACID, PLASMA: Lactic Acid, Venous: 1.7 mmol/L (ref 0.5–1.9)

## 2017-11-27 LAB — PHOSPHORUS
Phosphorus: 4.1 mg/dL (ref 2.5–4.6)
Phosphorus: 5.2 mg/dL — ABNORMAL HIGH (ref 2.5–4.6)

## 2017-11-27 MED ORDER — CLONAZEPAM 0.5 MG PO TBDP
0.5000 mg | ORAL_TABLET | Freq: Two times a day (BID) | ORAL | Status: DC | PRN
  Administered 2017-11-27: 0.5 mg via ORAL
  Filled 2017-11-27: qty 1

## 2017-11-27 MED ORDER — POTASSIUM CHLORIDE 20 MEQ/15ML (10%) PO SOLN
20.0000 meq | Freq: Two times a day (BID) | ORAL | Status: AC
  Administered 2017-11-27 (×2): 20 meq via ORAL
  Filled 2017-11-27 (×2): qty 15

## 2017-11-27 MED ORDER — FUROSEMIDE 10 MG/ML IJ SOLN
40.0000 mg | Freq: Once | INTRAMUSCULAR | Status: AC
  Administered 2017-11-28: 40 mg via INTRAVENOUS
  Filled 2017-11-27: qty 4

## 2017-11-27 MED ORDER — FUROSEMIDE 10 MG/ML IJ SOLN
20.0000 mg | Freq: Two times a day (BID) | INTRAMUSCULAR | Status: AC
  Administered 2017-11-27 (×2): 20 mg via INTRAVENOUS
  Filled 2017-11-27 (×2): qty 2

## 2017-11-27 MED ORDER — ORAL CARE MOUTH RINSE
15.0000 mL | Freq: Two times a day (BID) | OROMUCOSAL | Status: DC
  Administered 2017-11-27: 15 mL via OROMUCOSAL

## 2017-11-27 MED ORDER — MAGNESIUM SULFATE 2 GM/50ML IV SOLN
2.0000 g | Freq: Once | INTRAVENOUS | Status: AC
  Administered 2017-11-28: 2 g via INTRAVENOUS
  Filled 2017-11-27: qty 50

## 2017-11-27 MED ORDER — PAROXETINE HCL 20 MG PO TABS
20.0000 mg | ORAL_TABLET | Freq: Every day | ORAL | Status: DC
  Administered 2017-11-27: 20 mg
  Filled 2017-11-27 (×2): qty 1

## 2017-11-27 MED ORDER — POTASSIUM CHLORIDE 20 MEQ/15ML (10%) PO SOLN
40.0000 meq | ORAL | Status: AC
  Administered 2017-11-27 (×2): 40 meq
  Filled 2017-11-27 (×2): qty 30

## 2017-11-27 MED ORDER — LORAZEPAM 2 MG/ML IJ SOLN
0.5000 mg | INTRAMUSCULAR | Status: DC | PRN
  Administered 2017-11-27 – 2017-11-28 (×2): 1 mg via INTRAVENOUS
  Filled 2017-11-27: qty 1

## 2017-11-27 MED ORDER — PREDNISONE 5 MG PO TABS
5.0000 mg | ORAL_TABLET | Freq: Every day | ORAL | Status: DC
  Filled 2017-11-27: qty 1

## 2017-11-27 NOTE — Progress Notes (Signed)
NAME:  Jenny Branch, MRN:  [id number], DOB:  12-23-1955, LOS: 4 ADMISSION DATE:  11/15/2017, CONSULTATION DATE:  11/23/2008 REFERRING MD:  Dr Angela Adam, ER CHIEF COMPLAINT:  Short of breath   Brief History   62 yo female smoker with altered mental status, hypoxia/hypercapnia, hypotension, hyponatremia, acute renal failure.  Found to have Lt upper lobe consolidation, pneumococcal PNA on CXR.  Followed by Dr. Lake Bells in pulmonary office for advanced COPD.  Past Medical History Hypertension, PTSD, Depression  Significant Hospital Events   9/23 Admit 9/24 Intubated, started on giapressa, levo, vaso 9/25 Abx narrowed to ceftriaxone for pneumococcus 9/26 Weaned off levo, vaso, continues on giapressa. Abx narrowed to ceftriaxone  9/27 Off giapressa  Consults: date of consult/date signed off & final recs:  Cardiology 11/25/17 > Continue supportive care  Procedures (surgical and bedside):    Significant Diagnostic Tests:   Micro Data: Blood 9/23 >> Negative Sputum 9/23 >>  HIV 9/23 >> Non reactive Legionella 9/23 >> negative Pneumococcal 9/23 >> Positive  Antimicrobials:  Levaquin 9/23 >> 9/24 Merrem 9/24>>> 9/25 Vancomycin 9/23 >> 9/23 Zithromax 9/24 >> 9/25 Ceftriaxone 9/25>   Subjective:  Remains on vent sedated  Objective   Blood pressure 108/67, pulse 88, temperature (!) 100.7 F (38.2 C), temperature source Axillary, resp. rate 10, height 5\' 3"  (1.6 m), weight 72.6 kg, SpO2 100 %.    Vent Mode: PSV;CPAP FiO2 (%):  [40 %] 40 % Set Rate:  [30 bmp] 30 bmp Vt Set:  [420 mL] 420 mL PEEP:  [5 cmH20] 5 cmH20 Pressure Support:  [10 YTK16-01 cmH20] 10 cmH20 Plateau Pressure:  [15 UXN23-55 cmH20] 15 cmH20   Intake/Output Summary (Last 24 hours) at 11/27/2017 7322 Last data filed at 11/27/2017 0800 Gross per 24 hour  Intake 1836.94 ml  Output 954 ml  Net 882.94 ml   Filed Weights   11/17/2017 1010 11/26/17 1500 11/27/17 0403  Weight: 66.3 kg 74 kg 72.6 kg    Examination: Gen:      No acute distress HEENT:  EOMI, sclera anicteric, ETT Neck:     No masses; no thyromegaly Lungs:    Clear to auscultation bilaterally; normal respiratory effort CV:         Regular rate and rhythm; no murmurs Abd:      + bowel sounds; soft, non-tender; no palpable masses, no distension Ext:    No edema; adequate peripheral perfusion Skin:      Warm and dry; no rash Neuro: Sedated, unresponsive  Resolved Hospital Problem list   Hypoglycemia. Septic shock Lactic acidosis.  Assessment & Plan:  Sepsis from LUL community acquired pneumonia, pneumococcus PNA She has hx of PCN allergy but tolerating ceftriaxone. Antibiotic day 5/7  Acute on chronic hypoxic, hypercapnic respiratory failure. COPD and asthma with continue tobacco abuse. Start weaning trials Follow CXR,. ABG Scheduled bronchodilators Stop stress dose steroids. Start outpt prednisone 5 mg.  Acute renal failure from meds, volume depletion, sepsis > improving Hyponatremia. Monitor urine output and cr  Elevated troponin suspect from demand ischemia. Hx of hypertension. Off pressors Holding outpt HTN meds  Coffee ground emesis noted during intubation PPI Follow H/H Tube feeds  Thromboctyopenia. Likely from sepsis Low suspicion for HIT- 4T score is 2 Check HIT ab Monitor CBC  Acute metabolic encephalopathy. Hx of depression, severe anxiety Precedex drip Start outpt paxil Start outpt PRN klonopin for anxiety Holding outpt trazadone  Disposition / Summary of Today's Plan 11/27/17   Off pressors. Start weaning and diuresis  Diet: NPO DVT prophylaxis: SCDs GI prophylaxis: Protonix Mobility: Bed rest Code Status: Full code Family Communication: No family at bedside  The patient is critically ill with multiple organ system failure and requires high complexity decision making for assessment and support, frequent evaluation and titration of therapies, advanced monitoring, review of  radiographic studies and interpretation of complex data.   Critical Care Time devoted to patient care services, exclusive of separately billable procedures, described in this note is 35 minutes.   Marshell Garfinkel MD [facility] 11/27/2017, 9:09 AM

## 2017-11-27 NOTE — Progress Notes (Signed)
[facility] ADULT ICU REPLACEMENT PROTOCOL FOR AM LAB REPLACEMENT ONLY  The patient does apply for the [facility] Adult ICU Electrolyte Replacment Protocol based on the criteria listed below:   1. Is GFR >/= 40 ml/min? Yes.    Patient's GFR today is 49 2. Is urine output >/= 0.5 ml/kg/hr for the last 6 hours? Yes.   Patient's UOP is  0.97 ml/kg/hr 3. Is BUN < 60 mg/dL? Yes.    Patient's BUN today is 45 4. Abnormal electrolyte(s):Potassium 2.7 5. Ordered repletion with: replaced potassium per protocol 6. If a panic level lab has been reported, has the CCM MD in charge been notified? Yes.  .   Physician: Dr. Lucile Shutters.   Sallee Hogrefe P 11/27/2017 5:08 AM

## 2017-11-27 NOTE — Procedures (Signed)
Extubation Procedure Note  Patient Details:   Name: Jenny Branch DOB: 08-Apr-1955 MRN: [id number]   Airway Documentation:    Vent end date: 11/27/17 Vent end time: 1059   Evaluation  O2 sats: stable throughout Complications: No apparent complications Patient did tolerate procedure well. Bilateral Breath Sounds: Clear, Diminished   Yes   Positive cuff leak. Patient extubated to 3L [place] without complications. Tolerating well at this time. No stridor noted. RN at bedside.  Herbie Baltimore 11/27/2017, 11:18 AM

## 2017-11-27 NOTE — Progress Notes (Signed)
CRITICAL VALUE ALERT  Critical Value:  Potassium 2.7  Date & Time Notied:  11/27/17 @ 8138  Provider Notified: Warren Lacy

## 2017-11-27 NOTE — Progress Notes (Signed)
Progress Note  Patient Name: Jenny Branch Date of Encounter: 11/27/2017  Primary Cardiologist: No primary care provider on file.   Subjective   The patient has just been extubated.  She is emotional and somewhat confused.  No chest pain reported.  Inpatient Medications    Scheduled Meds: . chlorhexidine gluconate (MEDLINE KIT)  15 mL Mouth Rinse BID  . furosemide  20 mg Intravenous BID  . insulin aspart  0-15 Units Subcutaneous Q4H  . ipratropium-albuterol  3 mL Nebulization Q6H  . mouth rinse  15 mL Mouth Rinse 10 times per day  . montelukast  10 mg Per Tube QHS  . pantoprazole sodium  40 mg Per Tube Daily  . PARoxetine  20 mg Per Tube Daily  . potassium chloride  20 mEq Oral BID  . [START ON 12-22-2017] predniSONE  5 mg Oral Q breakfast   Continuous Infusions: . sodium chloride    . sodium chloride    . cefTRIAXone (ROCEPHIN)  IV Stopped (11/26/17 2030)  . dexmedetomidine (PRECEDEX) IV infusion Stopped (11/27/17 1043)  . feeding supplement (VITAL AF 1.2 CAL) 1,500 mL (11/26/17 1521)   PRN Meds: Place/Maintain arterial line **AND** sodium chloride, Place/Maintain arterial line **AND** sodium chloride, acetaminophen **OR** acetaminophen, albuterol, clonazepam, fentaNYL (SUBLIMAZE) injection, ibuprofen, senna-docusate   Vital Signs    Vitals:   11/27/17 1000 11/27/17 1100 11/27/17 1105 11/27/17 1200  BP: 100/65 (!) '69/58 98/63 98/70 '  Pulse: 86 87 87 93  Resp: 15 17 (!) 21 (!) 23  Temp:      TempSrc:      SpO2: 100% 100% 100% 98%  Weight:      Height:        Intake/Output Summary (Last 24 hours) at 11/27/2017 1303 Last data filed at 11/27/2017 1200 Gross per 24 hour  Intake 1768.32 ml  Output 1471 ml  Net 297.32 ml   Filed Weights   11/27/2017 1010 11/26/17 1500 11/27/17 0403  Weight: 66.3 kg 74 kg 72.6 kg    Telemetry    Sinus rhythm without any atrial arrhythmias- Personally Reviewed   Physical Exam  Chronically ill-appearing woman, tearful  Neck:  No JVD Cardiac: RRR, no murmurs, rubs, or gallops.  Respiratory: diffuse rhonchi to auscultation bilaterally. GI: Soft, nontender, non-distended  MS: 1+ bilateral leg edema; No deformity. Neuro:  Nonfocal  Psych: Normal affect   Labs    Chemistry Recent Labs  Lab 11/24/2017 0710  11/24/17 0558 11/25/17 0358 11/26/17 0403 11/27/17 0347  NA 127*   < > 127* 130* 133* 137  K 4.2   < > 4.6 4.5 3.7 2.7*  CL 75*   < > 85* 89* 96* 99  CO2 24   < > '24 22 23 26  ' GLUCOSE 41*   < > 68* 194* 160* 177*  BUN 35*   < > 38* 38* 41* 45*  CREATININE 3.08*   < > 2.83* 2.36* 1.80* 1.35*  CALCIUM 8.7*   < > 7.9* 7.4* 7.3* 7.6*  PROT 6.2*  --  4.9*  --   --   --   ALBUMIN 2.7*  --  2.1*  --   --   --   AST 95*  --  93*  --   --   --   ALT 32  --  43  --   --   --   ALKPHOS 136*  --  127*  --   --   --   BILITOT 2.4*  --  2.4*  --   --   --   GFRNONAA 16*   < > 17* 21* 30* 42*  GFRAA 18*   < > 20* 25* 34* 49*  ANIONGAP 28*   < > 18* 19* 14 12   < > = values in this interval not displayed.     Hematology Recent Labs  Lab 11/25/17 0358 11/26/17 0403 11/27/17 0347  WBC 33.2* 17.7* 9.1  RBC 3.94 3.67* 3.59*  HGB 14.2 13.0 12.8  HCT 39.7 36.8 36.7  MCV 100.8* 100.3* 102.2*  MCH 36.0* 35.4* 35.7*  MCHC 35.8 35.3 34.9  RDW 16.4* 16.8* 17.0*  PLT 175 78* 41*    Cardiac Enzymes Recent Labs  Lab 11/19/2017 1714 11/16/2017 2017 11/26/17 1007 11/27/17 0347  TROPONINI 3.32* 3.69* 1.37* 1.00*    Recent Labs  Lab 11/09/2017 0720  TROPIPOC 1.12*     BNP Recent Labs  Lab 11/22/2017 0710  BNP 2,852.4*     DDimer No results for input(s): DDIMER in the last 168 hours.   Radiology    Dg Chest Port 1 View  Result Date: 11/27/2017 CLINICAL DATA:  62 year old female with a history of pneumonia. EXAM: PORTABLE CHEST 1 VIEW COMPARISON:  Multiple prior most recent 11/26/2017 FINDINGS: Cardiomediastinal silhouette unchanged. Dense opacity of the left upper lung persists from the prior plain film  though is improved from the plain film dated 11/04/2017. Unchanged position of the gastric tube terminating out of the field of view. Endotracheal tube has been withdrawn now suitably position above the carina approximately 4.9 cm. Left subclavian approach central venous catheter with the tip appearing to terminate superior vena cava. No pleural effusion.  No right-sided airspace disease. IMPRESSION: Persisting left upper lobe airspace disease, unchanged from the most recent comparison though improved from 11/14/2017. Interval withdrawal of endotracheal tube, now terminating approximately 4.9 cm above the carina. Unchanged gastric tube and left subclavian central line. Electronically Signed   By: Corrie Mckusick D.O.   On: 11/27/2017 07:11   Dg Chest Port 1 View  Result Date: 11/26/2017 CLINICAL DATA:  Respiratory distress EXAM: PORTABLE CHEST 1 VIEW COMPARISON:  11/25/2017 FINDINGS: Endotracheal tube, nasogastric catheter and left subclavian central line are again seen. Cardiac shadow remains enlarged accentuated by the portable technique. The right lung remains well aerated without focal infiltrate. Persistent left upper lobe pneumonia is seen. No sizable effusion is noted. No bony abnormality is seen. IMPRESSION: Stable left upper lobe infiltrate Electronically Signed   By: Inez Catalina M.D.   On: 11/26/2017 09:23    Cardiac Studies   2D Echo: Study Conclusions  - Left ventricle: The cavity size was normal. Wall thickness was   increased in a pattern of mild LVH. Systolic function was normal.   The estimated ejection fraction was in the range of 60% to 65%.   Doppler parameters are consistent with abnormal left ventricular   relaxation (grade 1 diastolic dysfunction). - Right ventricle: The cavity size was moderately to severely   dilated. Systolic function was moderately reduced. - Pericardium, extracardiac: A small pericardial effusion was   identified. There was no evidence of hemodynamic  compromise.  Impressions:  - Technically difficult images with very poor image quality   The LV function appears to be well preserved.   The RV is dilated and hypocontractile.   Consider pulmonary embolus  Patient Profile     62 y.o. female with septic shock, paroxysmal atrial fib with RVR, elevated troponin suspected demand ischemia  Assessment &  Plan    1.  Demand ischemia of the myocardium: Patient with elevated troponin in the setting of critical illness.  No evidence for ACS.  LV function is normal by echo.  No further evaluation planned at this time.  2.  Atrial fibrillation with RVR: Occurred during the time of critical illness.  Telemetry reviewed over the last 24 hours and she remains in sinus rhythm.  No specific treatment recommended.  Continue supportive care and medical treatment of underlying infectious process.  [facility] will sign off.   Medication Recommendations:  none Other recommendations (labs, testing, etc):  none Follow up as an outpatient: primary care/pulmonogy. Cardiac FU prn  For questions or updates, please contact [place] Please consult [email or web address] for contact info under        Signed, Sherren Mocha, MD  11/27/2017, 1:03 PM

## 2017-11-28 DIAGNOSIS — J9602 Acute respiratory failure with hypercapnia: Secondary | ICD-10-CM

## 2017-11-28 DIAGNOSIS — Z515 Encounter for palliative care: Secondary | ICD-10-CM

## 2017-11-28 LAB — BASIC METABOLIC PANEL
ANION GAP: 12 (ref 5–15)
Anion gap: 9 (ref 5–15)
BUN: 46 mg/dL — ABNORMAL HIGH (ref 6–20)
BUN: 49 mg/dL — ABNORMAL HIGH (ref 6–20)
CALCIUM: 8.1 mg/dL — AB (ref 8.9–10.3)
CHLORIDE: 104 mmol/L (ref 98–111)
CO2: 30 mmol/L (ref 22–32)
CO2: 31 mmol/L (ref 22–32)
Calcium: 8.3 mg/dL — ABNORMAL LOW (ref 8.9–10.3)
Chloride: 102 mmol/L (ref 98–111)
Creatinine, Ser: 1.07 mg/dL — ABNORMAL HIGH (ref 0.44–1.00)
Creatinine, Ser: 1.18 mg/dL — ABNORMAL HIGH (ref 0.44–1.00)
GFR calc non Af Amer: 49 mL/min — ABNORMAL LOW (ref >60)
GFR calc non Af Amer: 56 mL/min — ABNORMAL LOW (ref >60)
GFR, EST AFRICAN AMERICAN: 57 mL/min — AB (ref >60)
Glucose, Bld: 89 mg/dL (ref 70–99)
Glucose, Bld: 90 mg/dL (ref 70–99)
POTASSIUM: 3.6 mmol/L (ref 3.5–5.1)
POTASSIUM: 4 mmol/L (ref 3.5–5.1)
SODIUM: 144 mmol/L (ref 135–145)
SODIUM: 144 mmol/L (ref 135–145)

## 2017-11-28 LAB — CULTURE, BLOOD (ROUTINE X 2)
Culture: NO GROWTH
Culture: NO GROWTH

## 2017-11-28 LAB — GLUCOSE, CAPILLARY
GLUCOSE-CAPILLARY: 96 mg/dL (ref 70–99)
Glucose-Capillary: 84 mg/dL (ref 70–99)
Glucose-Capillary: 83 mg/dL (ref 70–99)

## 2017-11-28 LAB — PHOSPHORUS: PHOSPHORUS: 6.5 mg/dL — AB (ref 2.5–4.6)

## 2017-11-28 LAB — MAGNESIUM: MAGNESIUM: 2.6 mg/dL — AB (ref 1.7–2.4)

## 2017-11-28 LAB — HEPARIN INDUCED PLATELET AB (HIT ANTIBODY): Heparin Induced Plt Ab: 0.196 OD (ref 0.000–0.400)

## 2017-11-28 MED ORDER — WHITE PETROLATUM EX OINT
TOPICAL_OINTMENT | CUTANEOUS | Status: AC
  Filled 2017-11-28: qty 28.35

## 2017-11-28 MED ORDER — LORAZEPAM 2 MG/ML IJ SOLN
0.5000 mg | Freq: Once | INTRAMUSCULAR | Status: AC
  Administered 2017-11-28: 2 mg via INTRAVENOUS
  Filled 2017-11-28: qty 1

## 2017-11-28 MED ORDER — LORAZEPAM 2 MG/ML IJ SOLN
INTRAMUSCULAR | Status: AC
  Filled 2017-11-28: qty 2

## 2017-11-28 MED ORDER — LORAZEPAM 2 MG/ML IJ SOLN
1.0000 mg | INTRAMUSCULAR | Status: DC | PRN
  Administered 2017-11-28: 4 mg via INTRAVENOUS

## 2017-11-28 MED ORDER — FENTANYL CITRATE (PF) 100 MCG/2ML IJ SOLN
INTRAMUSCULAR | Status: AC
  Administered 2017-11-28: 100 ug via INTRAVENOUS
  Filled 2017-11-28: qty 2

## 2017-11-28 MED ORDER — MORPHINE 100MG IN NS 100ML (1MG/ML) PREMIX INFUSION
1.0000 mg/h | INTRAVENOUS | Status: DC
  Administered 2017-11-28: 10 mg/h via INTRAVENOUS
  Filled 2017-11-28: qty 100

## 2017-11-28 MED ORDER — LACTATED RINGERS IV BOLUS: 500.0000 mL | Freq: Once | INTRAVENOUS | Status: DC

## 2017-11-28 MED ORDER — LORAZEPAM 2 MG/ML IJ SOLN
INTRAMUSCULAR | Status: AC
  Administered 2017-11-28: 1 mg via INTRAVENOUS
  Filled 2017-11-28: qty 1

## 2017-11-28 MED ORDER — FENTANYL CITRATE (PF) 100 MCG/2ML IJ SOLN
25.0000 ug | INTRAMUSCULAR | Status: DC | PRN
  Administered 2017-11-28 (×2): 100 ug via INTRAVENOUS
  Filled 2017-11-28: qty 2

## 2017-11-30 ENCOUNTER — Encounter: Payer: Self-pay | Admitting: Nurse Practitioner

## 2017-12-01 ENCOUNTER — Telehealth: Payer: Self-pay

## 2017-12-01 NOTE — Progress Notes (Signed)
Pt remains in A-fib, nonsustained RVR this shift, with code status changed to DNR, & no orders to further escalate care.  Will continue to monitor.

## 2017-12-01 NOTE — Assessment & Plan Note (Signed)
Plan for terminal care with full symptom management

## 2017-12-01 NOTE — Progress Notes (Signed)
NAME:  Jenny Branch, MRN:  [id number], DOB:  02-24-1956, LOS: 5 ADMISSION DATE:  11/03/2017, CONSULTATION DATE:  11/23/2008 REFERRING MD:  Dr Angela Adam, ER CHIEF COMPLAINT:  Short of breath  PCP Shamleffer, [name], MD   Brief History   62 yo female smoker with altered mental status, hypoxia/hypercapnia, hypotension, hyponatremia, acute renal failure.  Found to have Lt upper lobe consolidation, pneumococcal PNA on CXR.  Followed by Dr. Lake Bells in pulmonary office for advanced COPD.  Past Medical History Hypertension, PTSD, Depression  Significant Hospital Events   9/23 Admit 9/24 Intubated, started on giapressa, levo, vaso 9/25 Abx narrowed to ceftriaxone for pneumococcus 9/26 Weaned off levo, vaso, continues on giapressa. Abx narrowed to ceftriaxone  9/27 Off giapressa 9/28- DNR/DNI and comfort base  Consults: date of consult/date signed off & final recs:  Cardiology 11/25/17 > Continue supportive care  Procedures (surgical and bedside):    Significant Diagnostic Tests:   Micro Data: Blood 9/23 >> Negative Sputum 9/23 >>  HIV 9/23 >> Non reactive Legionella 9/23 >> negative Pneumococcal 9/23 >> Positive  Antimicrobials:  Levaquin 9/23 >> 9/24 Merrem 9/24>>> 9/25 Vancomycin 9/23 >> 9/23 Zithromax 9/24 >> 9/25 Ceftriaxone 9/25>    SUBJECTIVE/OVERNIGHT/INTERVAL HX   2017-12-10 -  Now DNR/DNI and towards comfort care with palliation as goal. Per RN - remains in A Fib and has intermittent delirum and restlessness.  Poorly controlled   Discussed with aunt at bedside (widow of deceased maternal uncle) - said her dad died 4 and patient baseline ECOG was 4 and could not see and did not have driver license and ADL's were very difficult. She feels based on substituted judgment and best interest comfort care is best. She has advised this to another maternal uncle who is still alive.   Objective   Blood pressure 116/84, pulse (!) 154, temperature 98.8 F (37.1 C),  temperature source Axillary, resp. rate 15, height 5\' 3"  (1.6 m), weight 71.5 kg, SpO2 95 %.    Vent Mode: PSV;CPAP FiO2 (%):  [40 %] 40 % PEEP:  [5 cmH20] 5 cmH20 Pressure Support:  [5 cmH20] 5 cmH20   Intake/Output Summary (Last 24 hours) at 2017-12-10 0804 Last data filed at 12/10/17 0600 Gross per 24 hour  Intake 387.45 ml  Output 3525 ml  Net -3137.55 ml   Filed Weights   11/26/17 1500 11/27/17 0403 12/10/2017 0500  Weight: 74 kg 72.6 kg 71.5 kg    General Appearance:  Looks moribund Head:  Normocephalic, without obvious abnormality, atraumatic Eyes:  PERRL - yes, conjunctiva/corneas - clear     Ears:  Normal external ear canals, both ears Nose:  G tube - no Throat:  ETT TUBE - no , OG tube - no Neck:  Supple,  No enlargement/tenderness/nodules Lungs: AE distant Heart:  S1 and S2 normal, no murmur, CVP - no.  Pressors - no Abdomen:  Soft, no masses, no organomegaly Genitalia / Rectal:  Not done Extremities:  Extremities- weak, diffuse edema and excoritations but moves all 4s Skin:  Has echchmouses and edema Neurologic:  Sedation - prn -> RASS - -2 to +2 . Moves all 4s - yes. CAM-ICU - posoitive . Orientation - not orieneted      [facility]  Lab 11/24/17 8563 11/24/17 1416 11/24/17 1504 11/24/17 1703 11/25/17 0350 11/26/17 0350 11/27/17 2339  PHART 7.304* 7.181* 7.278* 7.291* 7.359 7.462* 7.250*  PCO2ART 54.2* 78.1* 55.7* 54.5* 46.3 35.6 75.1*  PO2ART 100.0 413.0*  76.0* 98.0 146* 122* 117.0*  HCO3 26.9 29.1* 26.1 26.2 25.0 25.1 33.0*  TCO2 29 31 28 28   --   --  35*  O2SAT 97.0 100.0 93.0 96.0 98.8 98.6 98.0    CBC Recent Labs  Lab 11/25/17 0358 11/26/17 0403 11/27/17 0347  HGB 14.2 13.0 12.8  HCT 39.7 36.8 36.7  WBC 33.2* 17.7* 9.1  PLT 175 78* 41*    COAGULATION Recent Labs  Lab 11/29/2017 0710  INR 1.60    CARDIAC   Recent Labs  Lab 11/04/2017 1129 11/13/2017 1714 11/13/2017 2017 11/26/17 1007 11/27/17 0347    TROPONINI 2.03* 3.32* 3.69* 1.37* 1.00*   No results for input(s): PROBNP in the last 168 hours.   CHEMISTRY Recent Labs  Lab 11/25/17 0358 11/26/17 0403 11/26/17 1533 11/27/17 0347 11/27/17 1736 12/09/2017 0014 2017/12/09 0528  NA 130* 133*  --  137  --  144 144  K 4.5 3.7  --  2.7*  --  3.6 4.0  CL 89* 96*  --  99  --  102 104  CO2 22 23  --  26  --  30 31  GLUCOSE 194* 160*  --  177*  --  89 90  BUN 38* 41*  --  45*  --  46* 49*  CREATININE 2.36* 1.80*  --  1.35*  --  1.07* 1.18*  CALCIUM 7.4* 7.3*  --  7.6*  --  8.1* 8.3*  MG 1.6* 2.0 2.0 1.9 1.6*  --  2.6*  PHOS 5.1* 5.5* 5.2* 4.1 5.2*  --  6.5*   Estimated Creatinine Clearance: 48.6 mL/min (A) (by C-G formula based on SCr of 1.18 mg/dL (H)).   LIVER Recent Labs  Lab 11/09/2017 0710 11/24/17 0558  AST 95* 93*  ALT 32 43  ALKPHOS 136* 127*  BILITOT 2.4* 2.4*  PROT 6.2* 4.9*  ALBUMIN 2.7* 2.1*  INR 1.60  --      INFECTIOUS Recent Labs  Lab 11/16/2017 1129 11/02/2017 1403 11/26/17 1007 11/27/17 0347  LATICACIDVEN  --  4.8* 2.4* 1.7  PROCALCITON 11.85  --   --   --      ENDOCRINE CBG (last 3)  Recent Labs    12-09-2017 0000 12/09/17 0333 2017/12/09 0709  GLUCAP 96 84 83         IMAGING x48h  - image(s) personally visualized  -   highlighted in bold Dg Chest Port 1 View  Result Date: 11/27/2017 CLINICAL DATA:  Respiratory distress EXAM: PORTABLE CHEST 1 VIEW COMPARISON:  Multiple priors, most recently 11/27/2017 at 0610 hours FINDINGS: Interval extubation and removal of enteric tube. Left upper lobe opacity, similar to priors. Bilateral lower lobe opacities, new, likely atelectasis given rapid change. No definite pleural effusions. No pneumothorax. Cardiomegaly. Left subclavian venous catheter terminating at the cavoatrial junction. IMPRESSION: Interval extubation and removal of enteric tube. Left upper lobe opacity, similar to priors, likely reflecting pneumonia in the appropriate clinical setting.  Central obstructing mass is not excluded. Continued follow-up to resolution is suggested. Bilateral lower lobe opacities, new, likely atelectasis given rapid change. Electronically Signed   By: Julian Hy M.D.   On: 11/27/2017 23:44   Dg Chest Port 1 View  Result Date: 11/27/2017 CLINICAL DATA:  62 year old female with a history of pneumonia. EXAM: PORTABLE CHEST 1 VIEW COMPARISON:  Multiple prior most recent 11/26/2017 FINDINGS: Cardiomediastinal silhouette unchanged. Dense opacity of the left upper lung persists from the prior plain film though is improved from the plain  film dated 11/14/2017. Unchanged position of the gastric tube terminating out of the field of view. Endotracheal tube has been withdrawn now suitably position above the carina approximately 4.9 cm. Left subclavian approach central venous catheter with the tip appearing to terminate superior vena cava. No pleural effusion.  No right-sided airspace disease. IMPRESSION: Persisting left upper lobe airspace disease, unchanged from the most recent comparison though improved from 11/22/2017. Interval withdrawal of endotracheal tube, now terminating approximately 4.9 cm above the carina. Unchanged gastric tube and left subclavian central line. Electronically Signed   By: Corrie Mckusick D.O.   On: 11/27/2017 07:11     Recent Results (from the past 240 hour(s))  Culture, blood (routine x 2)     Status: None (Preliminary result)   Collection Time: 11/24/2017  7:20 AM  Result Value Ref Range Status   Specimen Description BLOOD RIGHT HAND  Final   Special Requests   Final    BOTTLES DRAWN AEROBIC AND ANAEROBIC Blood Culture results may not be optimal due to an inadequate volume of blood received in culture bottles   Culture   Final    NO GROWTH 4 DAYS Performed at [facility] [address] [place], [place] [address]    Report Status PENDING  Incomplete  Culture, blood (routine x 2)     Status: None (Preliminary result)    Collection Time: 11/18/2017  7:30 AM  Result Value Ref Range Status   Specimen Description BLOOD RIGHT WRIST  Final   Special Requests   Final    BOTTLES DRAWN AEROBIC ONLY Blood Culture results may not be optimal due to an inadequate volume of blood received in culture bottles   Culture   Final    NO GROWTH 4 DAYS Performed at [facility], [place] [address], [place], [place] [address]    Report Status PENDING  Incomplete  MRSA PCR Screening     Status: None   Collection Time: 11/10/2017 10:22 AM  Result Value Ref Range Status   MRSA by PCR NEGATIVE NEGATIVE Final    Comment:        The GeneXpert MRSA Assay (FDA approved for NASAL specimens only), is one component of a comprehensive MRSA colonization surveillance program. It is not intended to diagnose MRSA infection nor to guide or monitor treatment for MRSA infections. Performed at [facility], [place] [address] [place], [place] [address]      Resolved Hospital Problem list   Hypoglycemia. Septic shock Lactic acidosis.  Assessment & Plan:  Sepsis from LUL community acquired pneumonia, pneumococcus PNA Acute on chronic hypoxic, hypercapnic respiratory failure. Acute renal failure from meds, volume depletion, sepsis > improving Hyponatremia. Elevated troponin suspect from demand ischemia. Hx of hypertension. Coffee ground emesis noted during intubation Thromboctyopenia. Likely from sepsis Acute metabolic encephalopathy. Hx of depression, severe anxiety  Goal is now comfort care.terminal care. She looks like she is actively dying.  PLAN  - dc antibiotics  - fluids for delirium . Will do 500cc fluid bolus -  Start fent prn and versed prn; change to drip if needed -   Life expectancy few to several days       Disposition / Summary of Today's Plan 2017-12-16   Move to [employer]. CCM off an [place] primary from 11/29/17 -d/w [place]    Diet: NPO DVT prophylaxis: stop GI prophylaxis: stop Mobility:  Bed rest Code Status: Full code Family Communication: aunt discussion    SIGNATURE  Dr. Brand Males, M.D., F.C.C.P,  Pulmonary and Critical Care Medicine Staff Physician, [place] Director - Interstitial Lung Disease  Program  Pulmonary [place] [facility] [place], [place], [address]  Pager: [phone], If no answer or between  15:00h - 7:00h: call [phone] Telephone: [phone]  8:29 AM 12-26-2017

## 2017-12-01 NOTE — Significant Event (Signed)
PCCM Significant Event:   Pt seen and evaluated post extubation  No increased work of breathing but pt not managing secretions well Continues to be altered Opens eyes to her name but not verbal and not following commands ABG shows CO2 retention  I called patient's aunt who is NOK and decision maker We discussed current clinical condition and that she would not be a patient I would place on BiPaP due to her altered mental state and increased secretions. We discussed re-intubation and she stated that is not something the patient would want done.    Pt's resuscitation status converted to DNR/ DNI  Goal of care has changed to make patient comfortable  and palliate symptoms without invasive measures.  Updated clinical team of changes   Signed Dr Seward Carol Pulmonary Critical Care Locums

## 2017-12-01 NOTE — Progress Notes (Signed)
Dr. Jimmy Footman made aware of pt's increased confusion, work of breathing, fine crackles in bases, and tachypnea.  Received order for ABG & CXR, will continue to monitor.

## 2017-12-01 NOTE — Progress Notes (Signed)
Received one time order from Hayden Pedro, NP for 40mg  lasix post CXR & ABG result, will continue to monitor.

## 2017-12-01 NOTE — Progress Notes (Signed)
Nursing Note: Morphine infusion started at 1146 am and patient was pronounced at 1151.  Patient absent palpable pulse, asystole on the monitor, no spontaneous respirations, and pupils fixed.  TOD 1151.  CCM PC NP, CDS, and ME all called and cleared.  IV access and other invasive lines removed and patient prepared for the funeral home.  Post Mortem Checklist completed with family at bedside.  Bed control notified.  42mls of IV Morphine wasted with Enos Fling, RN in the sink per protocol.

## 2017-12-01 NOTE — Telephone Encounter (Signed)
Received d/c from [employer].  D/C is for cremation. Patient of Doctor Mastic... D/C will be taken to Pulmonary Unit for signature.  On 12/03/17 I received the d/c back from Doctor Ramaswamy.  I got the d/c ready and called the funeral home to let them know the d/c was ready for pickup.  I also faxed a copy to the funeral home per the funeral home request

## 2017-12-01 NOTE — Consult Note (Signed)
Palliative Medicine    Name: Jenny Branch Date: November 29, 2017 MRN: [id number]  DOB: Sep 16, 1955  Patient Care Team: Shamleffer, [name], MD as PCP - General (Internal Medicine)    REASON FOR CONSULTATION: Palliative Care consult requested for this 62 y.o. female for goals of medical treatment in patient with advanced COPD, hypertension, PTSD, depression, and extensive tobacco abuse, who was admitted on 11/14/2017 with altered mental status and multiorgan failure secondary to sepsis from community-acquired pneumonia.  She was admitted to the ICU, intubated, and required support of pressors.  Patient was extubated on 927 but developed increased work of breathing and hypercapnia and family opted to transition to comfort care instead of reintubation.  Palliative care was consulted.  SOCIAL HISTORY: Patient is not married and has no children. She had a sister who died two years ago and has no surviving siblings. Patient's mother died two weeks ago. Patient's aunt is involved.  CODE STATUS: DNR  PAST MEDICAL HISTORY: Past Medical History:  Diagnosis Date  . COPD (chronic obstructive pulmonary disease) (HCC)    ALLERGIES:  is allergic to penicillins.  MEDICATIONS:  Current Facility-Administered Medications  Medication Dose Route Frequency Provider Last Rate Last Dose  . albuterol (PROVENTIL) (2.5 MG/3ML) 0.083% nebulizer solution 2.5 mg  2.5 mg Nebulization Q2H PRN Chesley Mires, MD      . fentaNYL (SUBLIMAZE) injection 25-100 mcg  25-100 mcg Intravenous Q2H PRN Brand Males, MD   100 mcg at 2017-11-29 1021  . lactated ringers bolus 500 mL  500 mL Intravenous Once Brand Males, MD      . LORazepam (ATIVAN) 2 MG/ML injection           . LORazepam (ATIVAN) injection 1-4 mg  1-4 mg Intravenous Q4H PRN Brand Males, MD   4 mg at 2017/11/29 1035  . MEDLINE mouth rinse  15 mL Mouth Rinse BID Mannam, Praveen, MD   15 mL at 11/27/17 2000  . morphine 100mg  in NS 116mL (1mg /mL)  infusion - premix  1-10 mg/hr Intravenous Continuous Borders, Kirt Boys, NP        VITAL SIGNS: BP 105/76   Pulse (!) 105   Temp 98.8 F (37.1 C) (Axillary)   Resp 11   Ht 5\' 3"  (1.6 m)   Wt 71.5 kg   SpO2 (!) 84%   BMI 27.92 kg/m  Filed Weights   11/26/17 1500 11/27/17 0403 Nov 29, 2017 0500  Weight: 74 kg 72.6 kg 71.5 kg    Estimated body mass index is 27.92 kg/m as calculated from the following:   Height as of this encounter: 5\' 3"  (1.6 m).   Weight as of this encounter: 71.5 kg.  LABS: CBC:    Component Value Date/Time   WBC 9.1 11/27/2017 0347   HGB 12.8 11/27/2017 0347   HCT 36.7 11/27/2017 0347   PLT 41 (L) 11/27/2017 0347   MCV 102.2 (H) 11/27/2017 0347   NEUTROABS 38.2 (H) 11/30/2017 0710   LYMPHSABS 0.8 11/24/2017 0710   MONOABS 0.8 11/02/2017 0710   EOSABS 0.0 11/19/2017 0710   BASOSABS 0.0 11/29/2017 0710   Comprehensive Metabolic Panel:    Component Value Date/Time   NA 144 Nov 29, 2017 0528   K 4.0 11/29/17 0528   CL 104 11/29/2017 0528   CO2 31 2017/11/29 0528   BUN 49 (H) 2017/11/29 0528   CREATININE 1.18 (H) 11-29-17 0528   GLUCOSE 90 11-29-2017 0528   CALCIUM 8.3 (L) 2017/11/29 0528   AST 93 (H) 11/24/2017 3557  ALT 43 11/24/2017 0558   ALKPHOS 127 (H) 11/24/2017 0558   BILITOT 2.4 (H) 11/24/2017 0558   PROT 4.9 (L) 11/24/2017 0558   ALBUMIN 2.1 (L) 11/24/2017 0558    RADIOGRAPHIC STUDIES: Dg Chest 1 View  Result Date: 11/19/2017 CLINICAL DATA:  Shortness of breath. EXAM: CHEST  1 VIEW COMPARISON:  None. FINDINGS: There is an extensive consolidative infiltrate in the left upper lobe. Minimal atelectasis at the right lung base. Heart size and vascularity are normal. No definitive effusions. Bones are normal. IMPRESSION: Extensive left upper lobe consolidative infiltrate consistent with pneumonia. Electronically Signed   By: Lorriane Shire M.D.   On: 11/15/2017 07:43   Dg Chest Port 1 View  Result Date: 11/27/2017 CLINICAL DATA:   Respiratory distress EXAM: PORTABLE CHEST 1 VIEW COMPARISON:  Multiple priors, most recently 11/27/2017 at 0610 hours FINDINGS: Interval extubation and removal of enteric tube. Left upper lobe opacity, similar to priors. Bilateral lower lobe opacities, new, likely atelectasis given rapid change. No definite pleural effusions. No pneumothorax. Cardiomegaly. Left subclavian venous catheter terminating at the cavoatrial junction. IMPRESSION: Interval extubation and removal of enteric tube. Left upper lobe opacity, similar to priors, likely reflecting pneumonia in the appropriate clinical setting. Central obstructing mass is not excluded. Continued follow-up to resolution is suggested. Bilateral lower lobe opacities, new, likely atelectasis given rapid change. Electronically Signed   By: Julian Hy M.D.   On: 11/27/2017 23:44   Dg Chest Port 1 View  Result Date: 11/27/2017 CLINICAL DATA:  62 year old female with a history of pneumonia. EXAM: PORTABLE CHEST 1 VIEW COMPARISON:  Multiple prior most recent 11/26/2017 FINDINGS: Cardiomediastinal silhouette unchanged. Dense opacity of the left upper lung persists from the prior plain film though is improved from the plain film dated 11/07/2017. Unchanged position of the gastric tube terminating out of the field of view. Endotracheal tube has been withdrawn now suitably position above the carina approximately 4.9 cm. Left subclavian approach central venous catheter with the tip appearing to terminate superior vena cava. No pleural effusion.  No right-sided airspace disease. IMPRESSION: Persisting left upper lobe airspace disease, unchanged from the most recent comparison though improved from 11/10/2017. Interval withdrawal of endotracheal tube, now terminating approximately 4.9 cm above the carina. Unchanged gastric tube and left subclavian central line. Electronically Signed   By: Corrie Mckusick D.O.   On: 11/27/2017 07:11   Dg Chest Port 1 View  Result Date:  11/26/2017 CLINICAL DATA:  Respiratory distress EXAM: PORTABLE CHEST 1 VIEW COMPARISON:  11/25/2017 FINDINGS: Endotracheal tube, nasogastric catheter and left subclavian central line are again seen. Cardiac shadow remains enlarged accentuated by the portable technique. The right lung remains well aerated without focal infiltrate. Persistent left upper lobe pneumonia is seen. No sizable effusion is noted. No bony abnormality is seen. IMPRESSION: Stable left upper lobe infiltrate Electronically Signed   By: Inez Catalina M.D.   On: 11/26/2017 09:23   Dg Chest Port 1 View  Result Date: 11/25/2017 CLINICAL DATA:  Hypoxia EXAM: PORTABLE CHEST 1 VIEW COMPARISON:  November 24, 2017 FINDINGS: Endotracheal tube tip is 3.5 cm above the carina. Central catheter tip is in the superior vena cava. Nasogastric tube tip and side port are below the diaphragm. No pneumothorax. There is airspace consolidation throughout the left upper lobe with volume loss. Lungs elsewhere are clear. Heart size and pulmonary vascularity are normal. No adenopathy is appreciable. No bone lesions. IMPRESSION: Tube and catheter positions as described without pneumothorax. Extensive airspace consolidation consistent with  pneumonia left upper lobe. Lungs elsewhere clear. Stable cardiac silhouette. Electronically Signed   By: Lowella Grip III M.D.   On: 11/25/2017 10:28   Dg Chest Port 1 View  Result Date: 11/24/2017 CLINICAL DATA:  Status post placement of endotracheal and orogastric tubes. EXAM: PORTABLE CHEST 1 VIEW COMPARISON:  Portable chest x-ray of earlier today. FINDINGS: The endotracheal tube tip projects 3.9 cm above the carina. The esophagogastric tube tip and proximal port project below the inferior margin of the image. There is moderate gaseous distention of what appears to be the stomach. The cardiopericardial silhouette remains enlarged. There is dense infiltrate in the left upper lobe. The right lung is grossly clear. IMPRESSION:  Reasonable positioning of the endotracheal and esophagogastric tubes. Electronically Signed   By: David  Martinique M.D.   On: 11/24/2017 12:35   Dg Chest Port 1 View  Result Date: 11/24/2017 CLINICAL DATA:  Community acquired pneumonia. EXAM: PORTABLE CHEST 1 VIEW COMPARISON:  11/10/2017. FINDINGS: Left subclavian line noted in stable position. Stable cardiomegaly. Dense left upper lung infiltrate/consolidation without interval change from prior exam. IMPRESSION: 1.  Left subclavian line stable position. 2. Dense left upper lung infiltrate/consolidation without interval change. Followup PA and lateral chest X-ray is recommended in 3-4 weeks following trial of antibiotic therapy to ensure resolution and exclude underlying malignancy. Electronically Signed   By: Marcello Moores  Register   On: 11/24/2017 10:02   Dg Chest Port 1 View  Result Date: 11/10/2017 CLINICAL DATA:  62 year old female with central line placement. EXAM: PORTABLE CHEST 1 VIEW COMPARISON:  Chest radiograph dated 11/12/2017 FINDINGS: Left subclavian central venous catheter with tip over central SVC. No pneumothorax. Consolidative changes of the left upper lobe with progression of the airspace disease in the left mid to lower lung field since the earlier radiograph. There is no pleural effusion top-normal cardiac size. No acute osseous pathology. IMPRESSION: 1. Left subclavian central line with tip over central SVC. No pneumothorax. 2. Interval progression of the left lung opacities. Electronically Signed   By: Anner Crete M.D.   On: 11/09/2017 22:13    Review of Systems As noted above. Otherwise, a complete review of systems is negative.  Physical Exam General: Critically ill-appearing Lungs: Coarse breath sounds, with periods of apnea Heart: Irregular Abdomen: Soft, hypoactive bowel sounds Neuro: Unresponsive  IMPRESSION: I spoke with patient's aunt who is at the bedside.  Aunt says the patient does not have a healthcare power of  attorney.  However, she describes herself as patient's next of kin, given the recent passing of patient's mother.  She is making decisions on patient's behalf.  She confirms plan for comfort measures only.  She says that patient would not want any further aggressive or life prolonging measures.  Patient has required frequent dosing of PRN fentanyl to ensure comfort.  She is also required frequent PRN Lorazepam.  Patient is having periods of apnea, however, she does continue to have gasping breathing.  Will start morphine drip.    Emotional support provided to family.  Chaplain offered but was declined.  Will order comfort tray.  PLAN: 1. Comfort care 2. Morphine drip, start @ 1mg /hr and titrate for comfort. Max dose of 10mg /hr. Continue prn fentanyl and lorazepam.  3. Comfort tray for family 4. Would advise against transfer given apparent instability.    Family expressed understanding and was in agreement with this plan.    Time In: 1000 Time Out: 1030 Time Total: 30 minutes  Visit consisted of counseling and  education dealing with the complex and emotionally intense issues of symptom management and palliative care in the setting of serious and potentially life-threatening illness.Greater than 50%  of this time was spent counseling and coordinating care related to the above assessment and plan.  Signed by: Altha Harm, [place], NP-C, [place] (Work Cell)

## 2017-12-01 DEATH — deceased

## 2018-01-01 NOTE — Discharge Summary (Signed)
DISCHARGE SUMMARY    Date of admit: 11/11/2017  7:01 AM Date of discharge: 12-01-17  3:26 PM Length of Stay: 5 days  PCP is Shamleffer, [name], MD   PROBLEM LIST Active Problems:   CAP (community acquired pneumonia) - Pnuemococcal pneumonia   Encounter for cardioversion procedure   Chronic obstructive pulmonary disease ([place])   Respiratory distress   Acute respiratory failure with hypoxemia ([place])   Acute respiratory failure with hypercapnia [facility])   Terminal care   Palliative care encounter    SUMMARY Jenny Branch was 62 y.o. patient with    has a past medical history of Asthma, COPD (chronic obstructive pulmonary disease) ([place]), Depression, Hypertension, and PTSD (post-traumatic stress disorder).   has a past surgical history that includes Tonsillectomy.   Admitted on 11/13/2017 with  62 yo female smoker with altered mental status, hypoxia/hypercapnia, hypotension, hyponatremia, acute renal failure.  Found to have Lt upper lobe consolidation, pneumococcal PNA on CXR.  Followed by Dr. Lake Bells in pulmonary office for advanced COPD.  Past Medical History Hypertension, PTSD, Depression  Significant Hospital Events   9/23 Admit. Urine pneumococcal antigen - positive 9/24 Intubated, started on giapressa, levo, vaso 9/25 Abx narrowed to ceftriaxone for pneumococcus 9/26 Weaned off levo, vaso, continues on giapressa. Abx narrowed to ceftriaxone  9/27 Off giapressa 9/28- 01-Dec-2017 -  Now DNR/DNI and towards comfort care with palliation as goal. Per RN - remains in A Fib and has intermittent delirum and restlessness.  Poorly controlled deliruim, Discussed with aunt at bedside (widow of deceased maternal uncle) - said her dad died 71 and patient baseline ECOG was 4 and could not see and did not have driver license and ADL's were very difficult. She feels based on substituted judgment and best interest comfort care is best  Patient later expired  Dec 01, 2017     SIGNED Dr. Brand Males, M.D., F.C.C.P Pulmonary and Critical Care Medicine Staff Physician [facility] Pager: [phone], If no answer or between  15:00h - 7:00h: call [phone]  12/15/2017 5:55 PM

## 2019-06-26 IMAGING — DX DG CHEST 1V PORT
1 series · 2 of 2 positions shown · non-contrast
Comparison: Chest radiograph dated 11/23/2017

CLINICAL DATA: 61-year-old female with central line placement.

EXAM:
PORTABLE CHEST 1 VIEW

[Series 1: chest · 0.14mm/px · 2 of 2 slices shown]
[im 1/2]
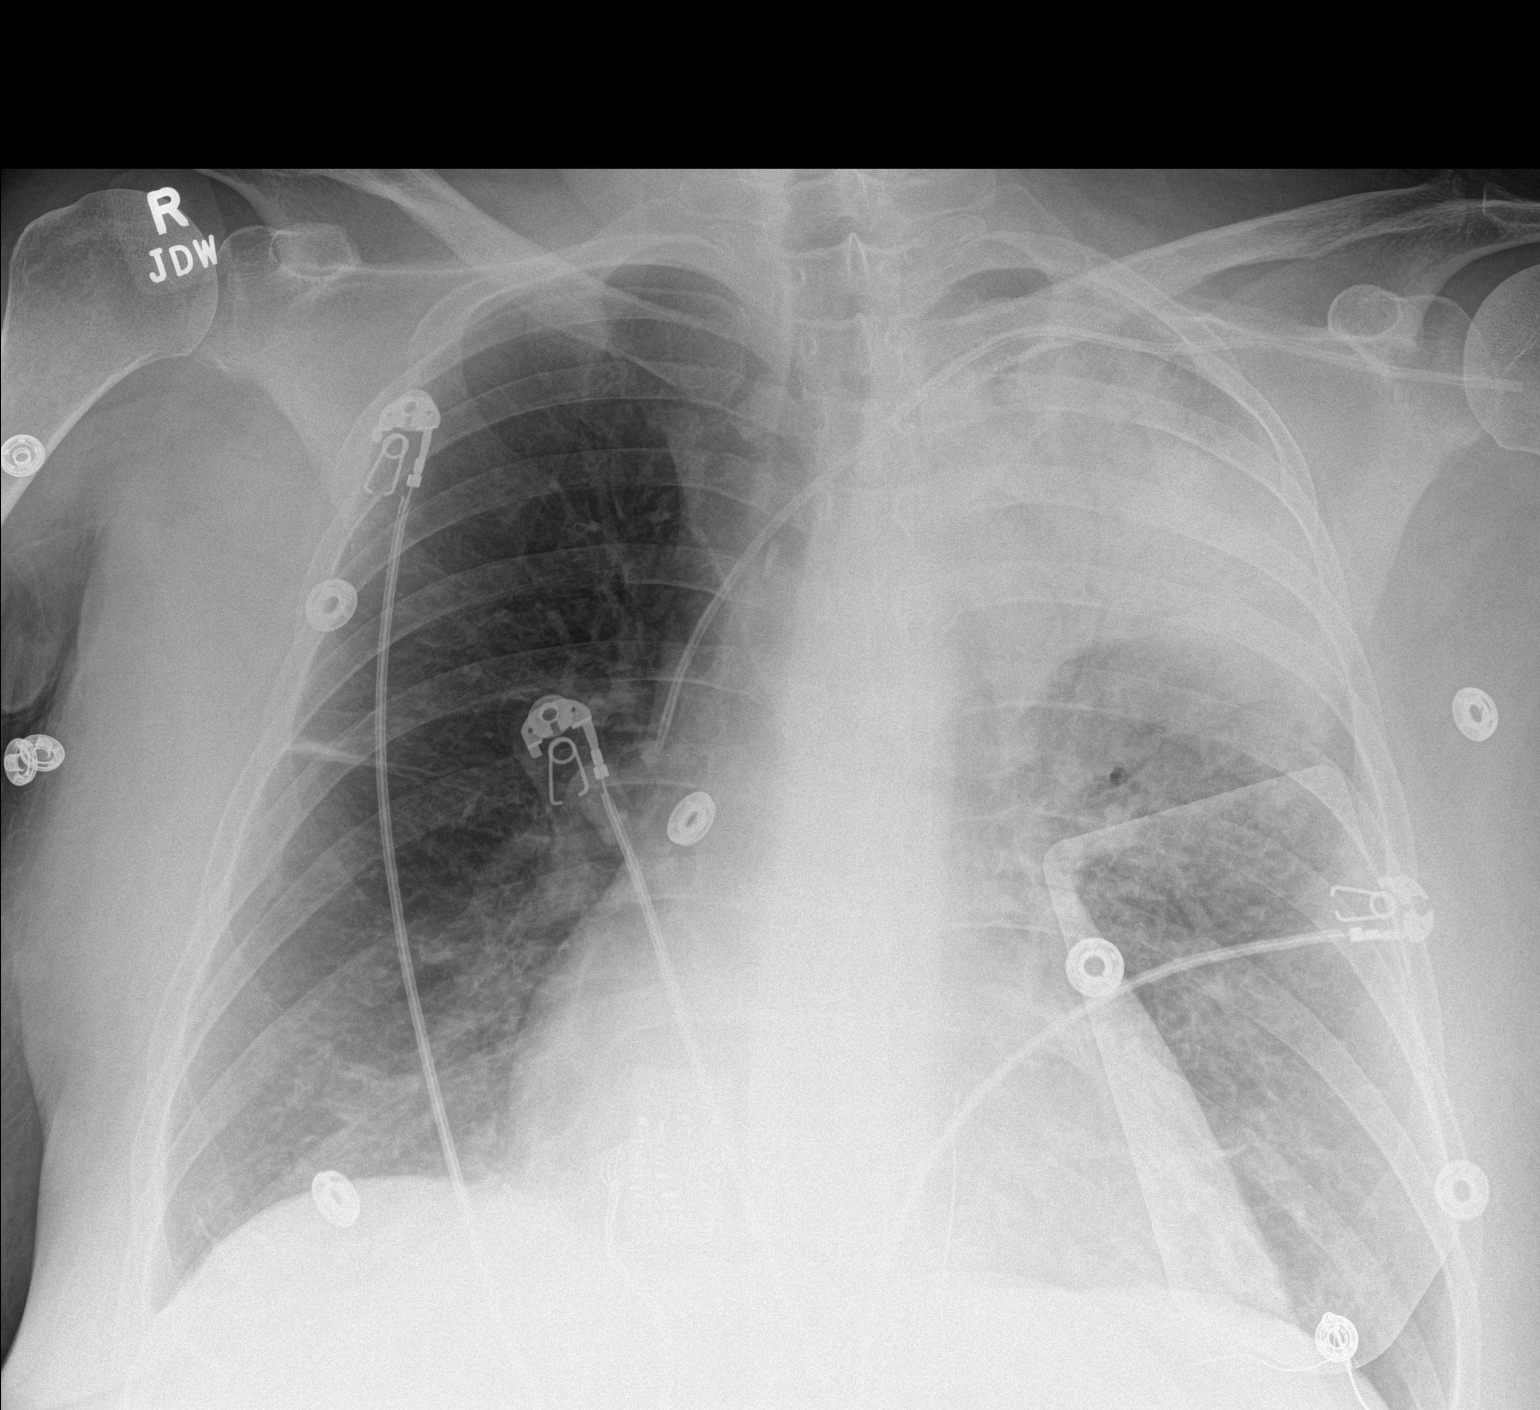
[im 2/2]
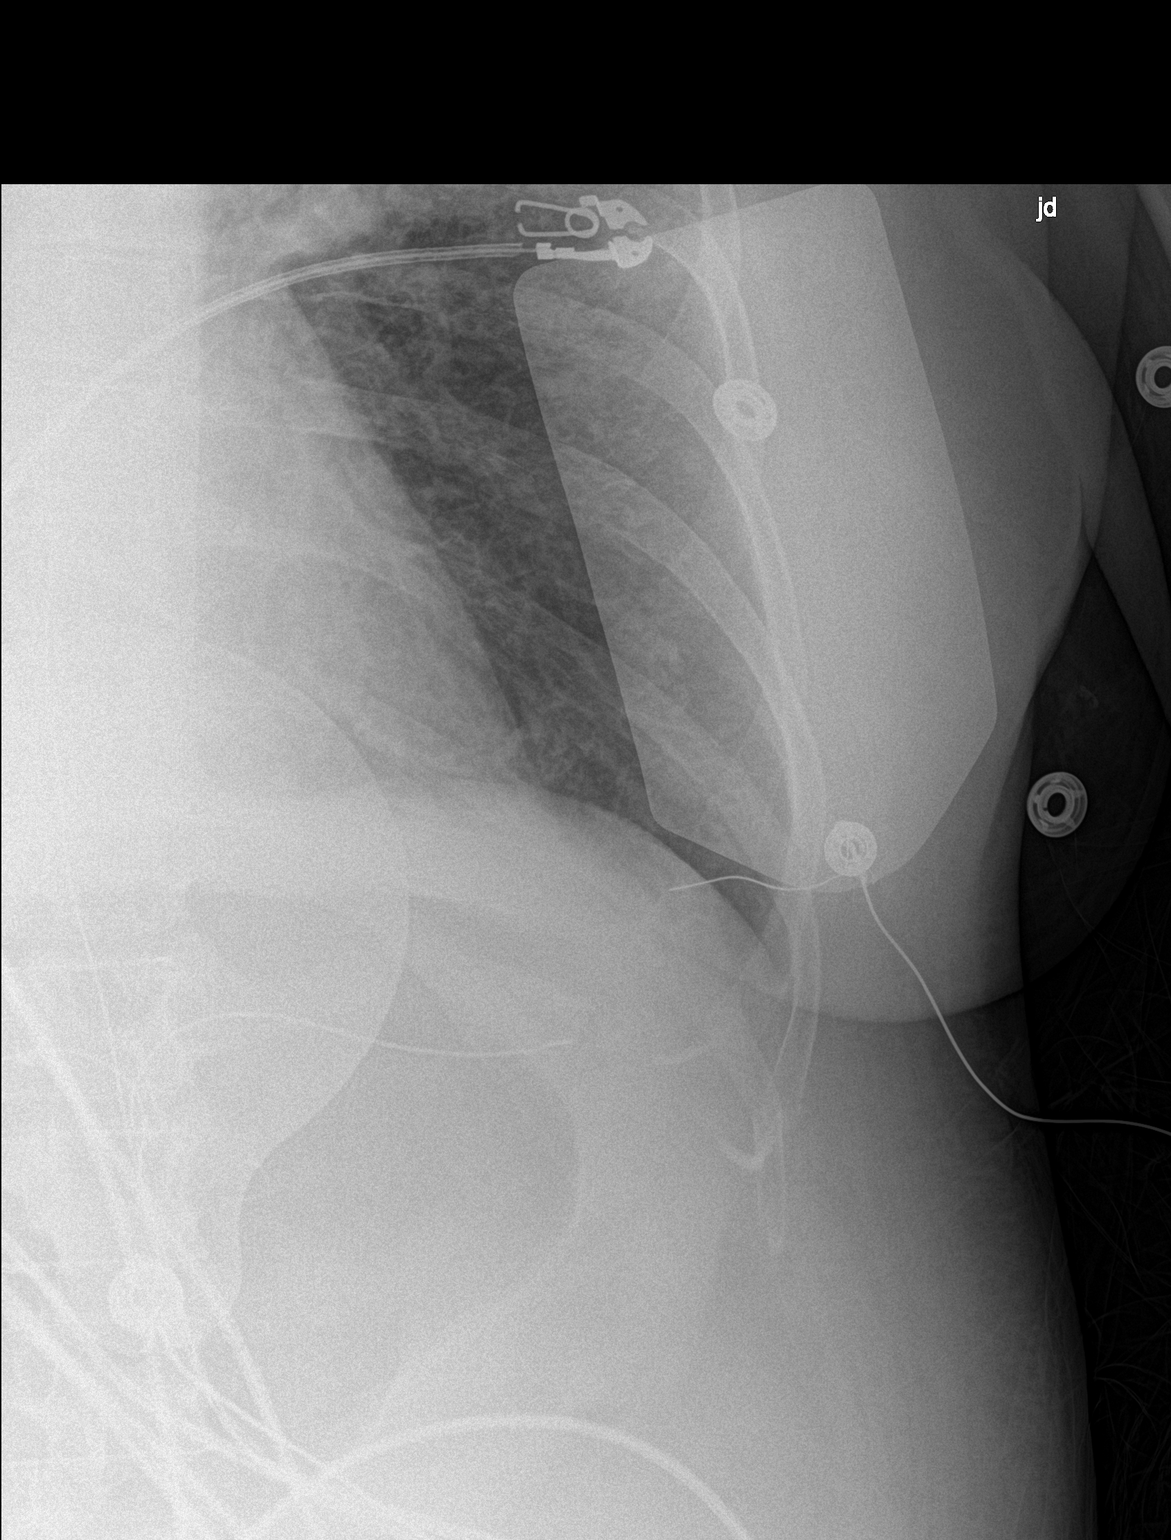

[2 of 2 positions shown; findings below may reference images not displayed]

FINDINGS: Left subclavian central venous catheter with tip over central SVC.
No pneumothorax. Consolidative changes of the left upper lobe with
progression of the airspace disease in the left mid to lower lung
field since the earlier radiograph. There is no pleural effusion
top-normal cardiac size. No acute osseous pathology.
IMPRESSION: 1. Left subclavian central line with tip over central SVC. No
pneumothorax.
2. Interval progression of the left lung opacities.

## 2019-06-27 IMAGING — DX DG CHEST 1V PORT
1 series · 1 of 1 positions shown · non-contrast
Comparison: Portable chest x-ray of earlier today.

CLINICAL DATA: Status post placement of endotracheal and orogastric
tubes.

EXAM:
PORTABLE CHEST 1 VIEW

[chest]
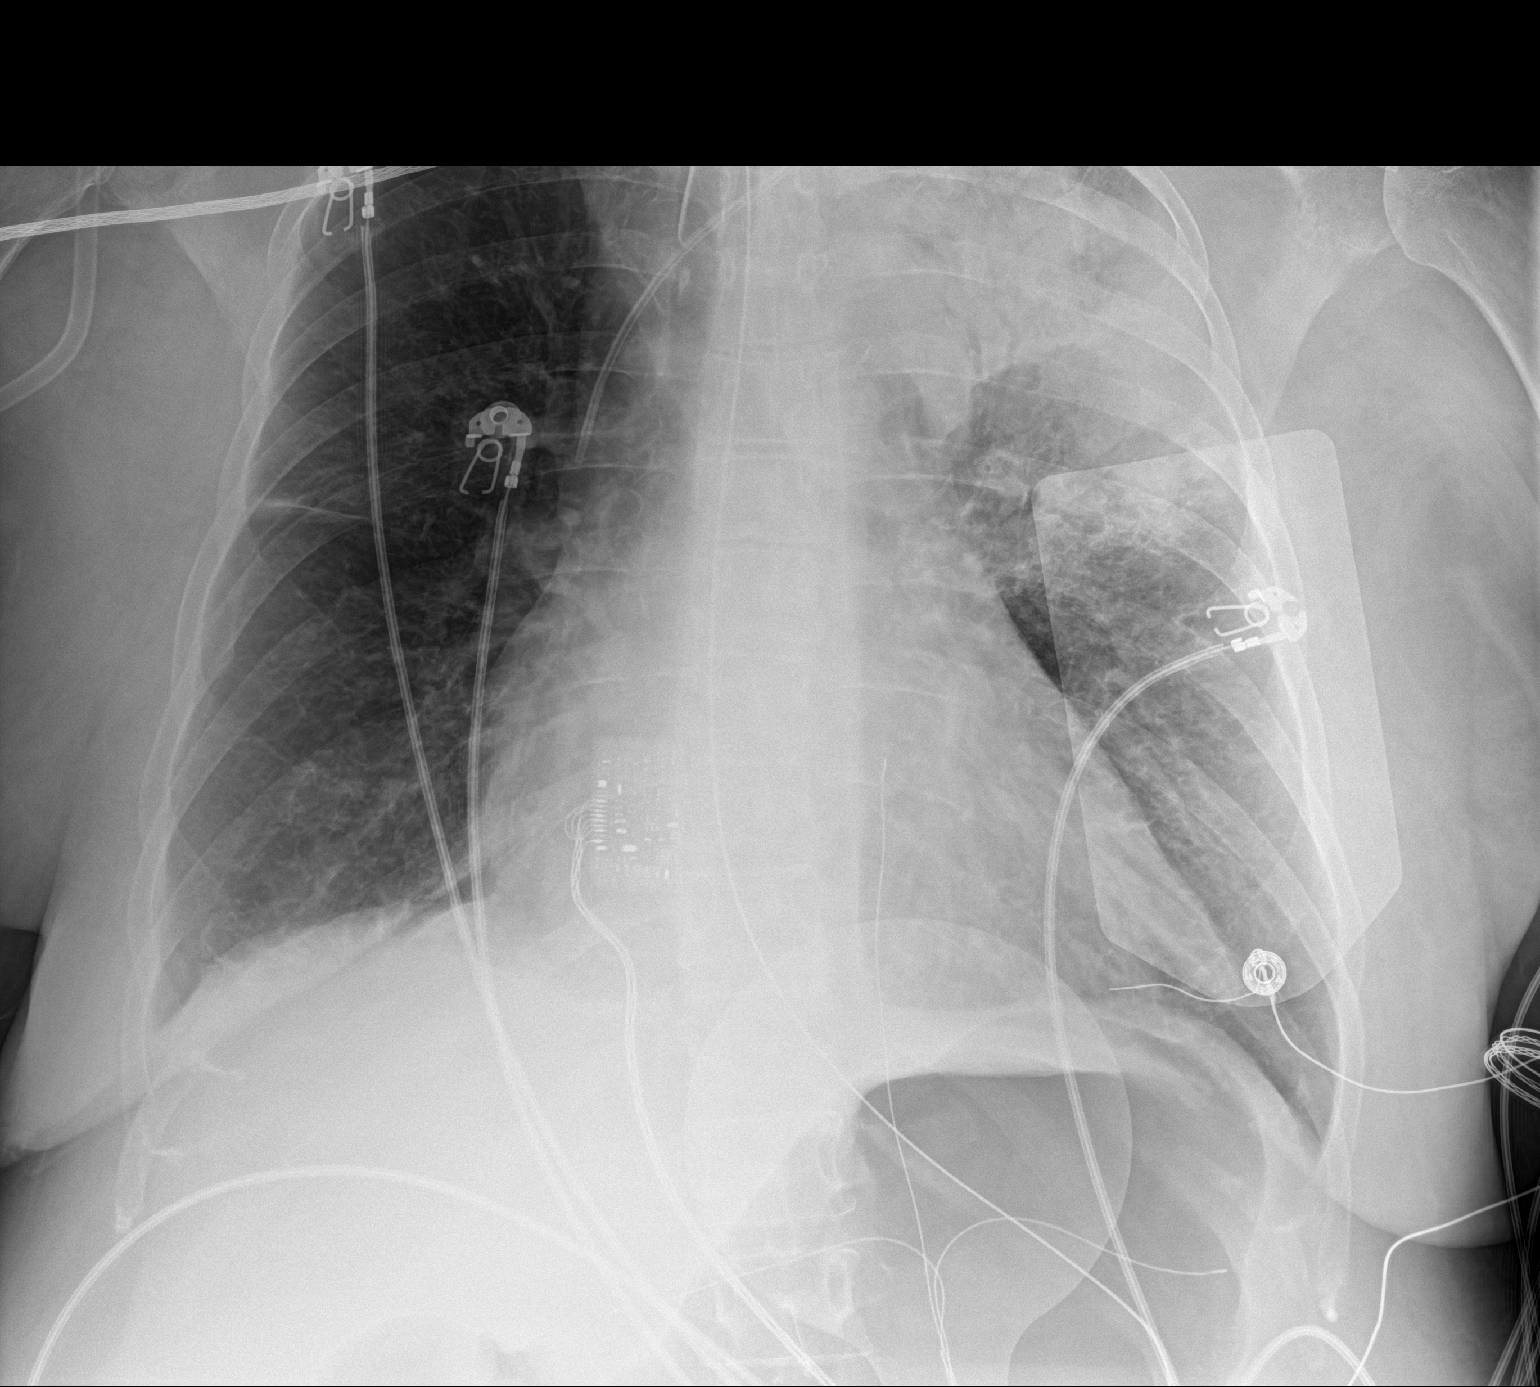

[1 of 1 positions shown; findings below may reference images not displayed]

FINDINGS: The endotracheal tube tip projects 3.9 cm above the carina. The
esophagogastric tube tip and proximal port project below the
inferior margin of the image. There is moderate gaseous distention
of what appears to be the stomach. The cardiopericardial silhouette
remains enlarged. There is dense infiltrate in the left upper lobe.
The right lung is grossly clear.
IMPRESSION: Reasonable positioning of the endotracheal and esophagogastric
tubes.

## 2019-06-28 IMAGING — DX DG CHEST 1V PORT
1 series · 1 of 1 positions shown · non-contrast
Comparison: November 24, 2017

CLINICAL DATA: Hypoxia

EXAM:
PORTABLE CHEST 1 VIEW

[chest]
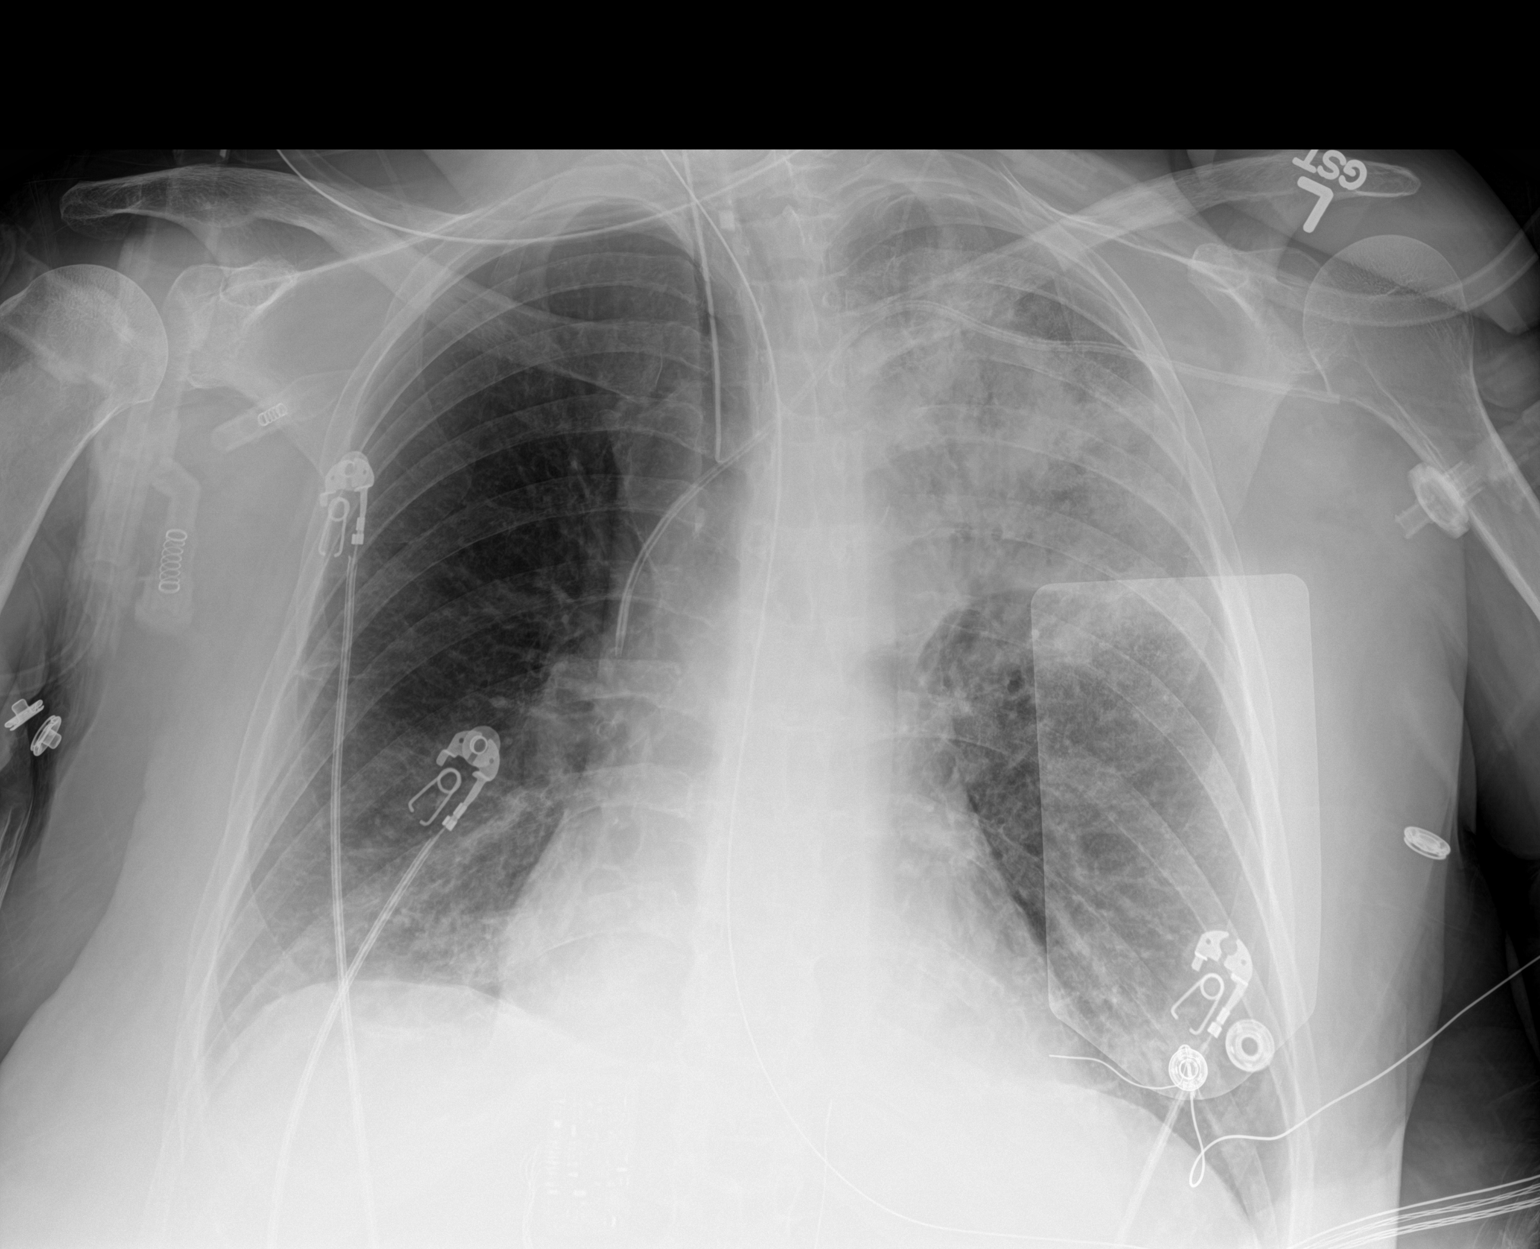

[1 of 1 positions shown; findings below may reference images not displayed]

FINDINGS: Endotracheal tube tip is 3.5 cm above the carina. Central catheter
tip is in the superior vena cava. Nasogastric tube tip and side port
are below the diaphragm. No pneumothorax.

There is airspace consolidation throughout the left upper lobe with
volume loss. Lungs elsewhere are clear. Heart size and pulmonary
vascularity are normal. No adenopathy is appreciable. No bone
lesions.
IMPRESSION: Tube and catheter positions as described without pneumothorax.
Extensive airspace consolidation consistent with pneumonia left
upper lobe. Lungs elsewhere clear. Stable cardiac silhouette.
